# Patient Record
Sex: Female | Born: 1940 | Race: Black or African American | Hispanic: No | Marital: Single | Smoking: Never smoker
Health system: Southern US, Community
[De-identification: ages and names within clinical notes are randomized; demographics above are authoritative.]

## PROBLEM LIST (undated history)

## (undated) DIAGNOSIS — H269 Unspecified cataract: Secondary | ICD-10-CM

## (undated) DIAGNOSIS — E785 Hyperlipidemia, unspecified: Secondary | ICD-10-CM

## (undated) DIAGNOSIS — R87619 Unspecified abnormal cytological findings in specimens from cervix uteri: Secondary | ICD-10-CM

## (undated) DIAGNOSIS — I10 Essential (primary) hypertension: Secondary | ICD-10-CM

## (undated) DIAGNOSIS — R011 Cardiac murmur, unspecified: Secondary | ICD-10-CM

## (undated) DIAGNOSIS — K635 Polyp of colon: Secondary | ICD-10-CM

## (undated) DIAGNOSIS — E119 Type 2 diabetes mellitus without complications: Secondary | ICD-10-CM

## (undated) DIAGNOSIS — IMO0002 Reserved for concepts with insufficient information to code with codable children: Secondary | ICD-10-CM

## (undated) HISTORY — DX: Unspecified cataract: H26.9

## (undated) HISTORY — DX: Essential (primary) hypertension: I10

## (undated) HISTORY — DX: Hyperlipidemia, unspecified: E78.5

## (undated) HISTORY — DX: Cardiac murmur, unspecified: R01.1

## (undated) HISTORY — DX: Polyp of colon: K63.5

## (undated) HISTORY — DX: Reserved for concepts with insufficient information to code with codable children: IMO0002

## (undated) HISTORY — DX: Unspecified abnormal cytological findings in specimens from cervix uteri: R87.619

---

## 2001-03-12 ENCOUNTER — Other Ambulatory Visit: Admission: RE | Admit: 2001-03-12 | Discharge: 2001-03-12 | Payer: Self-pay | Admitting: Obstetrics and Gynecology

## 2004-01-29 ENCOUNTER — Emergency Department (HOSPITAL_COMMUNITY): Admission: EM | Admit: 2004-01-29 | Discharge: 2004-01-29 | Payer: Self-pay | Admitting: Emergency Medicine

## 2009-01-22 ENCOUNTER — Emergency Department (HOSPITAL_COMMUNITY): Admission: EM | Admit: 2009-01-22 | Discharge: 2009-01-22 | Payer: Self-pay | Admitting: Emergency Medicine

## 2009-09-13 ENCOUNTER — Encounter: Admission: RE | Admit: 2009-09-13 | Discharge: 2009-09-13 | Payer: Self-pay | Admitting: Family Medicine

## 2010-12-13 ENCOUNTER — Encounter
Admission: RE | Admit: 2010-12-13 | Discharge: 2010-12-13 | Payer: Self-pay | Source: Home / Self Care | Attending: Family Medicine | Admitting: Family Medicine

## 2011-04-09 ENCOUNTER — Encounter: Payer: Self-pay | Admitting: Gastroenterology

## 2011-05-14 ENCOUNTER — Ambulatory Visit: Payer: Self-pay | Admitting: Gastroenterology

## 2011-11-24 ENCOUNTER — Emergency Department (INDEPENDENT_AMBULATORY_CARE_PROVIDER_SITE_OTHER)
Admission: EM | Admit: 2011-11-24 | Discharge: 2011-11-24 | Disposition: A | Discharge status: Home / Self Care | Payer: Medicare Other | Source: Home / Self Care | Attending: Family Medicine | Admitting: Family Medicine

## 2011-11-24 ENCOUNTER — Encounter (HOSPITAL_COMMUNITY): Payer: Self-pay

## 2011-11-24 DIAGNOSIS — J069 Acute upper respiratory infection, unspecified: Secondary | ICD-10-CM

## 2011-11-24 NOTE — ED Notes (Signed)
Pt had flu shot on Tuesday and has had cough, chills and sorethroat since Wednesday

## 2011-11-24 NOTE — ED Provider Notes (Signed)
History     CSN: [id number]  Arrival date & time 11/24/11  1610   First MD Initiated Contact with Patient 11/24/11 1619      Chief Complaint  Patient presents with  . Cough    (Consider location/radiation/quality/duration/timing/severity/associated sxs/prior treatment) HPI Comments: Carla Wells presents for evaluation of persistent cough, sore throat, aches, and chills over the last week. She reports getting a flu shot earlier in the week. She states her symptoms got a little better but then returned yesterday.  Patient is a 70 y.o. female presenting with URI. The history is provided by the patient.  URI The primary symptoms include fever, sore throat, cough and myalgias. The current episode started 3 to 5 days ago. This is a new problem.  The fever began 3 to 5 days ago. The fever has been unchanged since its onset. The maximum temperature recorded prior to her arrival was unknown.  The cough began 3 to 5 days ago. The cough is non-productive.  Symptoms associated with the illness include chills.    Past Medical History  Diagnosis Date  . Hypertension   . Hyperlipidemia     History reviewed. No pertinent past surgical history.  Family History  Problem Relation Age of Onset  . Stroke Mother   . Kidney failure Father   . Diabetes Brother   . Lung cancer Brother   . Cervical cancer Sister     History  Substance Use Topics  . Smoking status: Never Smoker   . Smokeless tobacco: Not on file  . Alcohol Use: No    OB History    Grav Para Term Preterm Abortions TAB SAB Ect Mult Living                  Review of Systems  Constitutional: Positive for fever and chills.  HENT: Positive for sore throat.   Eyes: Negative.   Respiratory: Positive for cough.   Gastrointestinal: Negative.   Genitourinary: Negative.   Musculoskeletal: Positive for myalgias.  Skin: Negative.   Neurological: Negative.     Allergies  Penicillins  Home Medications   Current Outpatient Rx   Name Route Sig Dispense Refill  . PRESCRIPTION MEDICATION  Unknown b/p pill     . SIMVASTATIN 10 MG PO TABS Oral Take 10 mg by mouth at bedtime.        BP 133/72  Pulse 84  Temp(Src) 98.7 F (37.1 C) (Oral)  Resp 17  SpO2 100%  Physical Exam  Nursing note and vitals reviewed. Constitutional: She is oriented to person, place, and time. She appears well-developed and well-nourished.  HENT:  Head: Normocephalic and atraumatic.  Right Ear: Tympanic membrane is retracted. Tympanic membrane is not erythematous.  Left Ear: Tympanic membrane is retracted. Tympanic membrane is not erythematous.  Mouth/Throat: Uvula is midline, oropharynx is clear and moist and mucous membranes are normal.  Eyes: EOM are normal.  Neck: Normal range of motion.  Pulmonary/Chest: Effort normal and breath sounds normal.  Musculoskeletal: Normal range of motion.  Neurological: She is alert and oriented to person, place, and time.  Skin: Skin is warm and dry.  Psychiatric: Her behavior is normal.    ED Course  Procedures (including critical care time)  Labs Reviewed - No data to display No results found.   1. URI (upper respiratory infection)       MDM          Richardo Priest, MD 12/08/11 1209

## 2013-01-30 ENCOUNTER — Encounter: Payer: Self-pay | Admitting: *Deleted

## 2013-01-30 DIAGNOSIS — I152 Hypertension secondary to endocrine disorders: Secondary | ICD-10-CM | POA: Insufficient documentation

## 2013-01-30 DIAGNOSIS — E785 Hyperlipidemia, unspecified: Secondary | ICD-10-CM | POA: Insufficient documentation

## 2013-02-17 ENCOUNTER — Telehealth: Payer: Self-pay | Admitting: Family Medicine

## 2013-02-17 DIAGNOSIS — E559 Vitamin D deficiency, unspecified: Secondary | ICD-10-CM

## 2013-02-17 MED ORDER — CHOLECALCIFEROL 50 MCG (2000 UT) PO CAPS: 1.0000 | ORAL_CAPSULE | Freq: Every day | ORAL | Status: DC

## 2013-02-17 NOTE — Telephone Encounter (Signed)
Pt no longer needs high dose Vit D.  Order for OTC 2000u/day sent to pharmacy.

## 2013-04-28 ENCOUNTER — Telehealth: Payer: Self-pay | Admitting: Family Medicine

## 2013-04-28 DIAGNOSIS — Z0000 Encounter for general adult medical examination without abnormal findings: Secondary | ICD-10-CM

## 2013-04-28 DIAGNOSIS — E785 Hyperlipidemia, unspecified: Secondary | ICD-10-CM

## 2013-04-28 DIAGNOSIS — Z79899 Other long term (current) drug therapy: Secondary | ICD-10-CM

## 2013-04-29 NOTE — Telephone Encounter (Signed)
Labs ordered for CPE

## 2013-05-06 ENCOUNTER — Encounter: Payer: Self-pay | Admitting: Family Medicine

## 2013-05-10 ENCOUNTER — Ambulatory Visit (INDEPENDENT_AMBULATORY_CARE_PROVIDER_SITE_OTHER): Payer: 59 | Admitting: Family Medicine

## 2013-05-10 ENCOUNTER — Encounter: Payer: Self-pay | Admitting: Family Medicine

## 2013-05-10 VITALS — BP 128/70 | HR 72 | Temp 98.1°F | Resp 16 | Ht 64.0 in | Wt 176.0 lb

## 2013-05-10 DIAGNOSIS — K219 Gastro-esophageal reflux disease without esophagitis: Secondary | ICD-10-CM

## 2013-05-10 DIAGNOSIS — I10 Essential (primary) hypertension: Secondary | ICD-10-CM

## 2013-05-10 LAB — COMPLETE METABOLIC PANEL WITH GFR
GFR, Est African American: 55 mL/min — ABNORMAL LOW
GFR, Est Non African American: 48 mL/min — ABNORMAL LOW
Potassium: 4.4 mEq/L (ref 3.5–5.3)
Total Bilirubin: 0.5 mg/dL (ref 0.3–1.2)

## 2013-05-10 LAB — LIPID PANEL
Cholesterol: 156 mg/dL (ref 0–200)
Triglycerides: 112 mg/dL (ref <150)

## 2013-05-10 MED ORDER — OMEPRAZOLE 40 MG PO CPDR: 40.0000 mg | DELAYED_RELEASE_CAPSULE | Freq: Every day | ORAL | Status: DC

## 2013-05-10 NOTE — Progress Notes (Signed)
  Subjective:    Patient ID: Carla Wells, female    DOB: May 04, 1941, 72 y.o.   MRN: [id number]  HPI Patient is here today for followup of her hypertension and hyperlipidemia. She'll taking Hyzaar 50/12.5 one tablet by mouth daily. She's also taking simvastatin 10 mg by mouth daily. She denies any chest pain, shortness of breath, dyspnea on exertion, myalgias, right upper quadrant pain. She's overdue for a complete physical exam and I recommended that she schedule that this year so we can do her Pap smear and her breast exam. Her colonoscopy and her mammogram are up-to-date. She is complaining of 2-3 episodes of heartburn per week. There is no angina is typical heartburn. Past Medical History  Diagnosis Date  . Murmur   . Colon polyps   . Cataract   . Abnormal pap   . Hypertension   . Hyperlipidemia    Current Outpatient Prescriptions on File Prior to Visit  Medication Sig Dispense Refill  . aspirin 81 MG tablet Take 81 mg by mouth daily.      . Cholecalciferol ([employer] VITAMIN D) 2000 UNITS CAPS Take 1 capsule (2,000 Units total) by mouth daily.  30 each  11  . simvastatin (ZOCOR) 10 MG tablet Take 10 mg by mouth at bedtime.         No current facility-administered medications on file prior to visit.   Allergies  Allergen Reactions  . Penicillins    History   Social History  . Marital Status: Single    Spouse Name: N/A    Number of Children: N/A  . Years of Education: N/A   Occupational History  . Not on file.   Social History Main Topics  . Smoking status: Never Smoker   . Smokeless tobacco: Not on file  . Alcohol Use: No  . Drug Use: No  . Sexually Active: Not on file   Other Topics Concern  . Not on file   Social History Narrative  . No narrative on file      Review of Systems  All other systems reviewed and are negative.       Objective:   Physical Exam  Vitals reviewed. Constitutional: She appears well-developed and well-nourished.  Cardiovascular:  Normal rate and regular rhythm.   Murmur heard. Pulmonary/Chest: Effort normal and breath sounds normal. No respiratory distress. She has no wheezes. She has no rales.  Abdominal: Soft. Bowel sounds are normal. She exhibits no distension. There is no tenderness. There is no rebound and no guarding.          Assessment & Plan:  1. HTN (hypertension) Blood pressure is well controlled. Check fasting lipid panel. Goal LDL is less than 130. Continue present medications at present dosages unless LDL is above L. - COMPLETE METABOLIC PANEL WITH GFR - Lipid panel  2. GERD (gastroesophageal reflux disease) - omeprazole (PRILOSEC) 40 MG capsule; Take 1 capsule (40 mg total) by mouth daily.  Dispense: 90 capsule; Refill: 3 Recommended the patient schedule complete physical exam.

## 2013-08-03 ENCOUNTER — Telehealth: Payer: Self-pay | Admitting: Family Medicine

## 2013-08-03 MED ORDER — SIMVASTATIN 10 MG PO TABS: 10.0000 mg | ORAL_TABLET | Freq: Every day | ORAL | Status: DC

## 2013-08-03 MED ORDER — LOSARTAN POTASSIUM-HCTZ 50-12.5 MG PO TABS: 1.0000 | ORAL_TABLET | Freq: Every day | ORAL | Status: DC

## 2013-08-03 NOTE — Telephone Encounter (Signed)
Simvastatin 10 mg tab 1 QHS #90 Losartan/HCTZ 50-12.5 mg tab 1 QD #90

## 2013-08-03 NOTE — Telephone Encounter (Signed)
Rx Refilled  

## 2013-09-16 ENCOUNTER — Ambulatory Visit (INDEPENDENT_AMBULATORY_CARE_PROVIDER_SITE_OTHER): Payer: 59 | Admitting: *Deleted

## 2013-09-16 DIAGNOSIS — Z23 Encounter for immunization: Secondary | ICD-10-CM

## 2013-10-21 ENCOUNTER — Telehealth: Payer: Self-pay | Admitting: Family Medicine

## 2013-11-04 ENCOUNTER — Ambulatory Visit (INDEPENDENT_AMBULATORY_CARE_PROVIDER_SITE_OTHER): Payer: 59 | Admitting: Family Medicine

## 2013-11-04 ENCOUNTER — Encounter: Payer: Self-pay | Admitting: Family Medicine

## 2013-11-04 VITALS — BP 130/84 | HR 76 | Temp 97.1°F | Resp 16 | Wt 179.0 lb

## 2013-11-04 DIAGNOSIS — Z23 Encounter for immunization: Secondary | ICD-10-CM

## 2013-11-04 DIAGNOSIS — R220 Localized swelling, mass and lump, head: Secondary | ICD-10-CM

## 2013-11-04 DIAGNOSIS — E785 Hyperlipidemia, unspecified: Secondary | ICD-10-CM

## 2013-11-04 LAB — LIPID PANEL
HDL: 46 mg/dL (ref >39)
Total CHOL/HDL Ratio: 3.5 Ratio
VLDL: 17 mg/dL (ref 0–40)

## 2013-11-04 LAB — COMPLETE METABOLIC PANEL WITH GFR
ALT: 13 U/L (ref 0–35)
AST: 14 U/L (ref 0–37)
Albumin: 4 g/dL (ref 3.5–5.2)
BUN: 21 mg/dL (ref 6–23)
Calcium: 9.7 mg/dL (ref 8.4–10.5)
Glucose, Bld: 107 mg/dL — ABNORMAL HIGH (ref 70–99)
Potassium: 4.3 mEq/L (ref 3.5–5.3)
Total Bilirubin: 0.4 mg/dL (ref 0.3–1.2)
Total Protein: 7.5 g/dL (ref 6.0–8.3)

## 2013-11-04 NOTE — Addendum Note (Signed)
Addended by: Legrand Rams B on: 11/04/2013 11:06 AM   Modules accepted: Orders

## 2013-11-04 NOTE — Progress Notes (Signed)
Subjective:    Patient ID: Carla Wells, female    DOB: 1941/01/19, 72 y.o.   MRN: [id number]  HPI  Patient is a very pleasant 72 year old [place] American female who comes in today for follow up of her hypertension and hyperlipidemia.  She's been having no concerns. Her blood pressure is well-controlled today at 130/84. She denies chest pain or shortness of breath. She is also taking simvastatin 10 mg by mouth daily for hyperlipidemia. She denies any myalgias or right upper quadrant pain. She is due for CMP and a fasting lipid panel. Of note she also has a soft mass in the left supraclavicular area adjacent to her neck. He has the physical characteristics of a lipoma that is large ill-defined. It may also represent some lymphedema secondary to lymphadenopathy.  Patient states it has been there for years and has not been enlarging or changing. Past Medical History  Diagnosis Date  . Murmur   . Colon polyps   . Cataract   . Abnormal pap   . Hypertension   . Hyperlipidemia    Current Outpatient Prescriptions on File Prior to Visit  Medication Sig Dispense Refill  . aspirin 81 MG tablet Take 81 mg by mouth daily.      Marland Kitchen losartan-hydrochlorothiazide (HYZAAR) 50-12.5 MG per tablet Take 1 tablet by mouth daily.  90 tablet  1  . omeprazole (PRILOSEC) 40 MG capsule Take 1 capsule (40 mg total) by mouth daily.  90 capsule  3  . simvastatin (ZOCOR) 10 MG tablet Take 1 tablet (10 mg total) by mouth at bedtime.  90 tablet  1   No current facility-administered medications on file prior to visit.   Allergies  Allergen Reactions  . Penicillins    History   Social History  . Marital Status: Single    Spouse Name: N/A    Number of Children: N/A  . Years of Education: N/A   Occupational History  . Not on file.   Social History Main Topics  . Smoking status: Never Smoker   . Smokeless tobacco: Not on file  . Alcohol Use: No  . Drug Use: No  . Sexual Activity: Not on file   Other  Topics Concern  . Not on file   Social History Narrative  . No narrative on file     Review of Systems  All other systems reviewed and are negative.       Objective:   Physical Exam  Vitals reviewed. Cardiovascular: Normal rate and regular rhythm.   Murmur heard. Pulmonary/Chest: Effort normal and breath sounds normal. No respiratory distress. She has no wheezes. She has no rales. She exhibits no tenderness.  Abdominal: Soft. Bowel sounds are normal. She exhibits no distension and no mass. There is no tenderness. There is no rebound and no guarding.  Musculoskeletal: She exhibits no edema.   4 cm x 2 cm ill-defined subcutaneous mass in the left supraclavicular area consistent with a lipoma versus lymphedema.        Assessment & Plan:  1. HLD (hyperlipidemia) Blood pressures well controlled. Continue current medication at the present dosages. I will check a fasting lipid panel. LDL is less than 130. I await results of laboratory testing. - COMPLETE METABOLIC PANEL WITH GFR - Lipid panel  2. Head or neck swelling, mass, or lump I scheduled the patient for an ultrasound of the neck to evaluate this area further. I believe it is most likely a lipoma but I do want to  rule out lymphedema from an underlying lymphadenopathy. - US Soft Tissue Head/Neck; Future

## 2013-11-05 ENCOUNTER — Encounter: Payer: Self-pay | Admitting: Family Medicine

## 2013-11-08 ENCOUNTER — Other Ambulatory Visit: Payer: Medicare Other

## 2013-11-08 ENCOUNTER — Encounter: Payer: Self-pay | Admitting: *Deleted

## 2013-11-12 ENCOUNTER — Other Ambulatory Visit: Payer: Medicare Other

## 2013-11-15 ENCOUNTER — Ambulatory Visit
Admission: RE | Admit: 2013-11-15 | Discharge: 2013-11-15 | Disposition: A | Discharge status: Home / Self Care | Payer: Medicare PPO | Source: Ambulatory Visit | Attending: Family Medicine | Admitting: Family Medicine

## 2013-11-15 DIAGNOSIS — R220 Localized swelling, mass and lump, head: Secondary | ICD-10-CM

## 2014-02-07 ENCOUNTER — Other Ambulatory Visit: Payer: Self-pay | Admitting: Family Medicine

## 2014-03-10 ENCOUNTER — Encounter: Payer: Self-pay | Admitting: Family Medicine

## 2014-04-22 ENCOUNTER — Other Ambulatory Visit: Payer: Self-pay | Admitting: Family Medicine

## 2014-05-12 ENCOUNTER — Ambulatory Visit (INDEPENDENT_AMBULATORY_CARE_PROVIDER_SITE_OTHER): Payer: 59 | Admitting: Family Medicine

## 2014-05-12 ENCOUNTER — Encounter: Payer: Self-pay | Admitting: Family Medicine

## 2014-05-12 VITALS — BP 120/70 | HR 78 | Temp 97.1°F | Resp 18 | Ht 64.0 in | Wt 182.0 lb

## 2014-05-12 DIAGNOSIS — E785 Hyperlipidemia, unspecified: Secondary | ICD-10-CM

## 2014-05-12 DIAGNOSIS — Z1389 Encounter for screening for other disorder: Secondary | ICD-10-CM

## 2014-05-12 DIAGNOSIS — Z1384 Encounter for screening for dental disorders: Secondary | ICD-10-CM

## 2014-05-12 DIAGNOSIS — I10 Essential (primary) hypertension: Secondary | ICD-10-CM

## 2014-05-12 LAB — LIPID PANEL
Cholesterol: 159 mg/dL (ref 0–200)
HDL: 48 mg/dL (ref >39)
LDL Cholesterol: 92 mg/dL (ref 0–99)
TRIGLYCERIDES: 93 mg/dL (ref <150)
Total CHOL/HDL Ratio: 3.3 Ratio
VLDL: 19 mg/dL (ref 0–40)

## 2014-05-12 LAB — COMPLETE METABOLIC PANEL WITH GFR
ALK PHOS: 96 U/L (ref 39–117)
ALT: 15 U/L (ref 0–35)
AST: 16 U/L (ref 0–37)
Albumin: 4.1 g/dL (ref 3.5–5.2)
BUN: 21 mg/dL (ref 6–23)
CO2: 25 mEq/L (ref 19–32)
Calcium: 9.6 mg/dL (ref 8.4–10.5)
Chloride: 103 mEq/L (ref 96–112)
Creat: 1.27 mg/dL — ABNORMAL HIGH (ref 0.50–1.10)
GFR, EST AFRICAN AMERICAN: 49 mL/min — AB
GFR, Est Non African American: 42 mL/min — ABNORMAL LOW
Glucose, Bld: 101 mg/dL — ABNORMAL HIGH (ref 70–99)
Potassium: 4.5 mEq/L (ref 3.5–5.3)
Sodium: 137 mEq/L (ref 135–145)
TOTAL PROTEIN: 7.2 g/dL (ref 6.0–8.3)
Total Bilirubin: 0.6 mg/dL (ref 0.2–1.2)

## 2014-05-12 NOTE — Progress Notes (Signed)
   Subjective:    Patient ID: Carla Wells, female    DOB: 1941/05/26, 73 y.o.   MRN: [id number]  HPI Patient has a history of hypertension.  She is currently taking Hyzaar 50/12.5 one by mouth daily. She does have occasional cramps in her right leg but otherwise is doing well. Her blood pressure is well controlled at 120/70. She denies any chest pain shortness of breath or dyspnea on exertion. She has a history of hyperlipidemia. She is currently taking simvastatin 10 mg by mouth daily. She denies any myalgias or right quadrant pain. Past Medical History  Diagnosis Date  . Murmur   . Colon polyps   . Cataract   . Abnormal pap   . Hypertension   . Hyperlipidemia    Current Outpatient Prescriptions on File Prior to Visit  Medication Sig Dispense Refill  . aspirin 81 MG tablet Take 81 mg by mouth daily.      Marland Kitchen losartan-hydrochlorothiazide (HYZAAR) 50-12.5 MG per tablet TAKE 1 TABLET EVERY DAY  90 tablet  1  . omeprazole (PRILOSEC) 40 MG capsule TAKE 1 CAPSULE EVERY DAY  90 capsule  3  . simvastatin (ZOCOR) 10 MG tablet TAKE 1 TABLET AT BEDTIME  90 tablet  1   No current facility-administered medications on file prior to visit.   No past surgical history on file. Allergies  Allergen Reactions  . Penicillins    History   Social History  . Marital Status: Single    Spouse Name: N/A    Number of Children: N/A  . Years of Education: N/A   Occupational History  . Not on file.   Social History Main Topics  . Smoking status: Never Smoker   . Smokeless tobacco: Not on file  . Alcohol Use: No  . Drug Use: No  . Sexual Activity: Not on file   Other Topics Concern  . Not on file   Social History Narrative  . No narrative on file      Review of Systems  All other systems reviewed and are negative.      Objective:   Physical Exam  Vitals reviewed. Constitutional: She appears well-developed and well-nourished.  Cardiovascular: Normal rate and regular rhythm.     Murmur heard. Pulmonary/Chest: Effort normal and breath sounds normal. No respiratory distress. She has no wheezes. She has no rales.  Abdominal: Soft. Bowel sounds are normal. She exhibits no distension. There is no tenderness. There is no rebound and no guarding.  Musculoskeletal: She exhibits no edema.          Assessment & Plan:  1. Encounter for screening for dental disorder - Ambulatory referral to Dentistry  2. HLD (hyperlipidemia) Check CMP and fasting lipid panel. Goal LDL is less than 130. - COMPLETE METABOLIC PANEL WITH GFR - Lipid panel  3. HTN (hypertension) Blood pressure is currently well controlled. I will make no changes in her medication at this time. I will monitor her renal function as well as potassium.

## 2014-05-17 ENCOUNTER — Encounter: Payer: Self-pay | Admitting: Family Medicine

## 2014-07-26 ENCOUNTER — Other Ambulatory Visit: Payer: Self-pay | Admitting: Family Medicine

## 2014-07-26 NOTE — Telephone Encounter (Signed)
Refill appropriate and filled per protocol. 

## 2014-09-06 ENCOUNTER — Ambulatory Visit (INDEPENDENT_AMBULATORY_CARE_PROVIDER_SITE_OTHER): Payer: 59 | Admitting: Family Medicine

## 2014-09-06 DIAGNOSIS — Z23 Encounter for immunization: Secondary | ICD-10-CM

## 2014-12-14 ENCOUNTER — Telehealth: Payer: Self-pay | Admitting: Family Medicine

## 2014-12-14 NOTE — Telephone Encounter (Signed)
[phone]  Pt is needing to speak to you about a referral to dentist ([facility]) (fax number [phone]) They have her last name as Detwiler though.

## 2014-12-15 NOTE — Telephone Encounter (Signed)
lmtrc

## 2014-12-21 ENCOUNTER — Telehealth: Payer: Self-pay | Admitting: *Deleted

## 2014-12-21 NOTE — Telephone Encounter (Signed)
lmtrc

## 2014-12-21 NOTE — Telephone Encounter (Signed)
Submitted humana referral thru acuity connect for authorization to Dr. 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

## 2014-12-27 ENCOUNTER — Encounter: Payer: Commercial Managed Care - HMO | Admitting: Family Medicine

## 2014-12-28 DIAGNOSIS — H4011X2 Primary open-angle glaucoma, moderate stage: Secondary | ICD-10-CM | POA: Diagnosis not present

## 2015-01-02 ENCOUNTER — Ambulatory Visit (INDEPENDENT_AMBULATORY_CARE_PROVIDER_SITE_OTHER): Payer: Commercial Managed Care - HMO | Admitting: Family Medicine

## 2015-01-02 ENCOUNTER — Encounter: Payer: Self-pay | Admitting: Family Medicine

## 2015-01-02 VITALS — BP 140/80 | HR 76 | Temp 97.7°F | Resp 16 | Ht 64.0 in | Wt 188.0 lb

## 2015-01-02 DIAGNOSIS — Z0000 Encounter for general adult medical examination without abnormal findings: Secondary | ICD-10-CM | POA: Diagnosis not present

## 2015-01-02 DIAGNOSIS — I10 Essential (primary) hypertension: Secondary | ICD-10-CM | POA: Diagnosis not present

## 2015-01-02 DIAGNOSIS — R946 Abnormal results of thyroid function studies: Secondary | ICD-10-CM | POA: Diagnosis not present

## 2015-01-02 DIAGNOSIS — Z01419 Encounter for gynecological examination (general) (routine) without abnormal findings: Secondary | ICD-10-CM | POA: Diagnosis not present

## 2015-01-02 DIAGNOSIS — E785 Hyperlipidemia, unspecified: Secondary | ICD-10-CM | POA: Diagnosis not present

## 2015-01-02 LAB — COMPLETE METABOLIC PANEL WITH GFR
ALT: 14 U/L (ref 0–35)
AST: 15 U/L (ref 0–37)
Albumin: 4 g/dL (ref 3.5–5.2)
Alkaline Phosphatase: 106 U/L (ref 39–117)
BILIRUBIN TOTAL: 0.5 mg/dL (ref 0.2–1.2)
BUN: 20 mg/dL (ref 6–23)
CALCIUM: 9.7 mg/dL (ref 8.4–10.5)
CO2: 27 meq/L (ref 19–32)
Chloride: 103 mEq/L (ref 96–112)
Creat: 1.22 mg/dL — ABNORMAL HIGH (ref 0.50–1.10)
GFR, EST NON AFRICAN AMERICAN: 44 mL/min — AB
GFR, Est African American: 51 mL/min — ABNORMAL LOW
Glucose, Bld: 91 mg/dL (ref 70–99)
Potassium: 4 mEq/L (ref 3.5–5.3)
Sodium: 138 mEq/L (ref 135–145)
Total Protein: 7.1 g/dL (ref 6.0–8.3)

## 2015-01-02 LAB — CBC WITH DIFFERENTIAL/PLATELET
Basophils Absolute: 0.1 10*3/uL (ref 0.0–0.1)
Basophils Relative: 1 % (ref 0–1)
EOS ABS: 0.1 10*3/uL (ref 0.0–0.7)
EOS PCT: 2 % (ref 0–5)
HEMATOCRIT: 41.8 % (ref 36.0–46.0)
Hemoglobin: 13.8 g/dL (ref 12.0–15.0)
LYMPHS PCT: 31 % (ref 12–46)
Lymphs Abs: 2 10*3/uL (ref 0.7–4.0)
MCH: 27.9 pg (ref 26.0–34.0)
MCHC: 33 g/dL (ref 30.0–36.0)
MCV: 84.4 fL (ref 78.0–100.0)
MONOS PCT: 10 % (ref 3–12)
MPV: 11.9 fL (ref 8.6–12.4)
Monocytes Absolute: 0.6 10*3/uL (ref 0.1–1.0)
NEUTROS ABS: 3.6 10*3/uL (ref 1.7–7.7)
Neutrophils Relative %: 56 % (ref 43–77)
Platelets: 275 10*3/uL (ref 150–400)
RBC: 4.95 MIL/uL (ref 3.87–5.11)
RDW: 15.1 % (ref 11.5–15.5)
WBC: 6.4 10*3/uL (ref 4.0–10.5)

## 2015-01-02 LAB — LIPID PANEL
CHOL/HDL RATIO: 3.3 ratio
Cholesterol: 153 mg/dL (ref 0–200)
HDL: 47 mg/dL (ref >39)
LDL Cholesterol: 86 mg/dL (ref 0–99)
TRIGLYCERIDES: 98 mg/dL (ref <150)
VLDL: 20 mg/dL (ref 0–40)

## 2015-01-02 MED ORDER — ZOSTER VACCINE LIVE 19400 UNT/0.65ML ~~LOC~~ SOLR: 0.6500 mL | Freq: Once | SUBCUTANEOUS | Status: DC

## 2015-01-02 NOTE — Progress Notes (Signed)
Subjective:    Patient ID: Carla Wells, female    DOB: Jan 01, 1941, 74 y.o.   MRN: [id number]  HPI Patient is here today for complete physical exam. Her mammogram is due in March. She is due today for a Pap smear. Her colonoscopy was performed in 2010 and is not due again until 2020. She is due for a bone density test. Patient has had Pneumovax 23, Prevnar, and her flu shot. She declines a tetanus shot today. We did discuss the shingles vaccine and I sent the prescription to her pharmacy. She is also due for fasting lab work. Otherwise she has no concerns. She is following up regularly with her eye doctor for glaucoma.  Past Medical History  Diagnosis Date  . Murmur   . Colon polyps   . Cataract   . Abnormal pap   . Hypertension   . Hyperlipidemia    No past surgical history on file. Current Outpatient Prescriptions on File Prior to Visit  Medication Sig Dispense Refill  . aspirin 81 MG tablet Take 81 mg by mouth daily.    Marland Kitchen losartan-hydrochlorothiazide (HYZAAR) 50-12.5 MG per tablet TAKE 1 TABLET EVERY DAY 90 tablet 1  . omeprazole (PRILOSEC) 40 MG capsule TAKE 1 CAPSULE EVERY DAY 90 capsule 3  . simvastatin (ZOCOR) 10 MG tablet TAKE 1 TABLET AT BEDTIME 90 tablet 1   No current facility-administered medications on file prior to visit.   Allergies  Allergen Reactions  . Penicillins    History   Social History  . Marital Status: Single    Spouse Name: N/A    Number of Children: N/A  . Years of Education: N/A   Occupational History  . Not on file.   Social History Main Topics  . Smoking status: Never Smoker   . Smokeless tobacco: Not on file  . Alcohol Use: No  . Drug Use: No  . Sexual Activity: Not on file   Other Topics Concern  . Not on file   Social History Narrative   Family History  Problem Relation Age of Onset  . Stroke Mother   . Kidney failure Father   . Diabetes Brother   . Cancer Brother     Lung - Smoker  . Lung cancer Brother   . Cervical  cancer Sister   . Cancer Sister   . Cancer Sister     ?Cervical CA      Review of Systems  All other systems reviewed and are negative.      Objective:   Physical Exam  Constitutional: She is oriented to person, place, and time. She appears well-developed and well-nourished. No distress.  HENT:  Head: Normocephalic and atraumatic.  Right Ear: External ear normal.  Left Ear: External ear normal.  Nose: Nose normal.  Mouth/Throat: Oropharynx is clear and moist. No oropharyngeal exudate.  Eyes: Conjunctivae and EOM are normal. Pupils are equal, round, and reactive to light. Right eye exhibits no discharge. Left eye exhibits no discharge. No scleral icterus.  Neck: Normal range of motion. Neck supple. No JVD present. No tracheal deviation present. No thyromegaly present.  Cardiovascular: Normal rate, regular rhythm, normal heart sounds and intact distal pulses.  Exam reveals no gallop and no friction rub.   No murmur heard. Pulmonary/Chest: Effort normal and breath sounds normal. No stridor. No respiratory distress. She has no wheezes. She has no rales. She exhibits no tenderness.  Abdominal: Soft. Bowel sounds are normal. She exhibits no distension and no mass.  There is no tenderness. There is no rebound and no guarding.  Genitourinary: Vagina normal and uterus normal.  Musculoskeletal: Normal range of motion. She exhibits no edema or tenderness.  Lymphadenopathy:    She has no cervical adenopathy.  Neurological: She is alert and oriented to person, place, and time. She has normal reflexes. She displays normal reflexes. No cranial nerve deficit. She exhibits normal muscle tone. Coordination normal.  Skin: Skin is warm. No rash noted. She is not diaphoretic. No erythema. No pallor.  Psychiatric: She has a normal mood and affect. Her behavior is normal. Judgment and thought content normal.  Vitals reviewed.         Assessment & Plan:  Routine general medical examination at a  health care facility - Plan: CBC with Differential/Platelet, COMPLETE METABOLIC PANEL WITH GFR, Lipid panel, TSH, PAP, ThinPrep, Imaging, Medicare, MM Digital Screening, DG Bone Density  Patient's physical exam today is completely normal. I sent her Pap smear to pathology. I will check a CBC, CMP, fasting lipid panel, and TSH. I will schedule the patient for mammogram as well as a bone density. I sent the shingles vaccine to her pharmacy. She declined a tetanus shot. 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

## 2015-01-03 LAB — PAP, THIN PREP, IMAGING, MEDICARE

## 2015-01-03 LAB — TSH: TSH: 4.632 u[IU]/mL — AB (ref 0.350–4.500)

## 2015-01-04 ENCOUNTER — Encounter: Payer: Self-pay | Admitting: *Deleted

## 2015-01-04 ENCOUNTER — Encounter: Payer: Self-pay | Admitting: Family Medicine

## 2015-01-16 ENCOUNTER — Other Ambulatory Visit: Payer: Self-pay | Admitting: Family Medicine

## 2015-02-21 DIAGNOSIS — E559 Vitamin D deficiency, unspecified: Secondary | ICD-10-CM | POA: Diagnosis not present

## 2015-02-21 DIAGNOSIS — Z1231 Encounter for screening mammogram for malignant neoplasm of breast: Secondary | ICD-10-CM | POA: Diagnosis not present

## 2015-02-21 LAB — HM DEXA SCAN: HM Dexa Scan: NORMAL

## 2015-02-21 LAB — HM MAMMOGRAPHY

## 2015-03-01 ENCOUNTER — Encounter: Payer: Self-pay | Admitting: Family Medicine

## 2015-03-17 ENCOUNTER — Encounter: Payer: Self-pay | Admitting: *Deleted

## 2015-03-29 DIAGNOSIS — H35033 Hypertensive retinopathy, bilateral: Secondary | ICD-10-CM | POA: Diagnosis not present

## 2015-03-29 DIAGNOSIS — H21262 Iris atrophy (essential) (progressive), left eye: Secondary | ICD-10-CM | POA: Diagnosis not present

## 2015-03-29 DIAGNOSIS — Z961 Presence of intraocular lens: Secondary | ICD-10-CM | POA: Diagnosis not present

## 2015-03-29 DIAGNOSIS — H4011X2 Primary open-angle glaucoma, moderate stage: Secondary | ICD-10-CM | POA: Diagnosis not present

## 2015-04-24 ENCOUNTER — Telehealth: Payer: Self-pay | Admitting: Family Medicine

## 2015-04-24 MED ORDER — OMEPRAZOLE 40 MG PO CPDR: 40.0000 mg | DELAYED_RELEASE_CAPSULE | Freq: Every day | ORAL | Status: DC

## 2015-04-24 NOTE — Telephone Encounter (Signed)
Medication called/sent to requested pharmacy  

## 2015-04-24 NOTE — Telephone Encounter (Signed)
Patient needs refill on omeprazole  [facility] pharmacy

## 2015-05-24 DIAGNOSIS — H4011X2 Primary open-angle glaucoma, moderate stage: Secondary | ICD-10-CM | POA: Diagnosis not present

## 2015-07-24 ENCOUNTER — Other Ambulatory Visit: Payer: Self-pay | Admitting: Family Medicine

## 2015-09-07 ENCOUNTER — Ambulatory Visit (INDEPENDENT_AMBULATORY_CARE_PROVIDER_SITE_OTHER): Payer: Commercial Managed Care - HMO | Admitting: *Deleted

## 2015-09-07 DIAGNOSIS — Z23 Encounter for immunization: Secondary | ICD-10-CM | POA: Diagnosis not present

## 2015-09-07 NOTE — Progress Notes (Signed)
Patient ID: Carla Wells, female   DOB: 30-May-1941, 74 y.o.   MRN: [id number]  Patient seen in office for Influenza Vaccination.   Tolerated IM administration well.   Immunization history updated.

## 2015-10-05 ENCOUNTER — Ambulatory Visit (INDEPENDENT_AMBULATORY_CARE_PROVIDER_SITE_OTHER): Payer: Commercial Managed Care - HMO | Admitting: Physician Assistant

## 2015-10-05 ENCOUNTER — Encounter: Payer: Self-pay | Admitting: Physician Assistant

## 2015-10-05 VITALS — BP 142/86 | HR 76 | Temp 97.5°F | Resp 18 | Wt 184.0 lb

## 2015-10-05 DIAGNOSIS — R110 Nausea: Secondary | ICD-10-CM | POA: Diagnosis not present

## 2015-10-05 DIAGNOSIS — R531 Weakness: Secondary | ICD-10-CM

## 2015-10-05 NOTE — Progress Notes (Signed)
    Patient ID: Carla Wells MRN: [id number], DOB: 1941/01/12, 74 y.o. Date of Encounter: 10/05/2015, 2:41 PM    Chief Complaint:  Chief Complaint  Patient presents with  . sick off/on x 1 week    nauseated, feeling weak     HPI: 74 y.o. year old AA female presents with above symptoms.  She states that she first noticed these symptoms on Saturday 09/30/15. That day felt nauseous. Had no other symptoms just felt not nausea. Says that she felt okay Sunday Monday Tuesday. Yesterday which was Wednesday she started feeling nausea again. Today she again is feeling nausea and a little weak.  During all of this time she has had NO vomiting, diarrhea, fever, anorexia-----says that she has a normal appetite and is eating just like she normally would.  No other complaints or concerns.     Home Meds:   Outpatient Prescriptions Prior to Visit  Medication Sig Dispense Refill  . aspirin 81 MG tablet Take 81 mg by mouth daily.    Marland Kitchen losartan-hydrochlorothiazide (HYZAAR) 50-12.5 MG per tablet TAKE 1 TABLET EVERY DAY 90 tablet 1  . omeprazole (PRILOSEC) 40 MG capsule Take 1 capsule (40 mg total) by mouth daily. 90 capsule 3  . simvastatin (ZOCOR) 10 MG tablet TAKE 1 TABLET AT BEDTIME 90 tablet 1  . Vitamin D, Cholecalciferol, 1000 UNITS CAPS Take 1,000 mg by mouth.     No facility-administered medications prior to visit.    Allergies:  Allergies  Allergen Reactions  . Penicillins       Review of Systems: See HPI for pertinent ROS. All other ROS negative.    Physical Exam: Blood pressure 142/86, pulse 76, temperature 97.5 F (36.4 C), temperature source Oral, resp. rate 18, weight 184 lb (83.462 kg)., Body mass index is 31.57 kg/(m^2). General:  WNWD AAF. Appears in no acute distress. Neck: Supple. No thyromegaly. No lymphadenopathy. Lungs: Clear bilaterally to auscultation without wheezes, rales, or rhonchi. Breathing is unlabored. Heart: Regular rhythm. II/VI murmur. Abdomen:  Soft, non-tender, non-distended with normoactive bowel sounds. No hepatomegaly. No rebound/guarding. No obvious abdominal masses. No area of tenderness with palpation. Msk:  Strength and tone normal for age. Extremities/Skin: Warm and dry. Neuro: Alert and oriented X 3. Moves all extremities spontaneously. Gait is normal. CNII-XII grossly in tact. Psych:  Responds to questions appropriately with a normal affect.     ASSESSMENT AND PLAN:  74 y.o. year old female with  1. Nausea without vomiting Will check lab to further evaluate possible cause of symptoms. Follow-up lab results then determine further treatment plan. In the interim, I told patient to consume a clear liquid diet and advance to bland diet as tolerated. - CBC with Differential/Platelet - COMPLETE METABOLIC PANEL WITH GFR - TSH  2. Weakness Will check lab to further evaluate possible cause of symptoms. Follow-up lab results then determine further treatment plan. In the interim, I told patient to consume a clear liquid diet and advance to bland diet as tolerated.  - CBC with Differential/Platelet - COMPLETE METABOLIC PANEL WITH GFR - TSH   Signed, [address] [place], [place], [facility] 10/05/2015 2:41 PM

## 2015-10-06 LAB — CBC WITH DIFFERENTIAL/PLATELET
BASOS PCT: 1 % (ref 0–1)
Basophils Absolute: 0.1 10*3/uL (ref 0.0–0.1)
EOS ABS: 0.1 10*3/uL (ref 0.0–0.7)
Eosinophils Relative: 2 % (ref 0–5)
HCT: 41.2 % (ref 36.0–46.0)
Hemoglobin: 13.4 g/dL (ref 12.0–15.0)
Lymphocytes Relative: 36 % (ref 12–46)
Lymphs Abs: 2.2 10*3/uL (ref 0.7–4.0)
MCH: 27.8 pg (ref 26.0–34.0)
MCHC: 32.5 g/dL (ref 30.0–36.0)
MCV: 85.5 fL (ref 78.0–100.0)
MONO ABS: 0.6 10*3/uL (ref 0.1–1.0)
MPV: 11.7 fL (ref 8.6–12.4)
Monocytes Relative: 10 % (ref 3–12)
NEUTROS PCT: 51 % (ref 43–77)
Neutro Abs: 3.1 10*3/uL (ref 1.7–7.7)
PLATELETS: 270 10*3/uL (ref 150–400)
RBC: 4.82 MIL/uL (ref 3.87–5.11)
RDW: 14.7 % (ref 11.5–15.5)
WBC: 6 10*3/uL (ref 4.0–10.5)

## 2015-10-06 LAB — COMPLETE METABOLIC PANEL WITH GFR
ALT: 21 U/L (ref 6–29)
AST: 19 U/L (ref 10–35)
Albumin: 3.9 g/dL (ref 3.6–5.1)
Alkaline Phosphatase: 106 U/L (ref 33–130)
BUN: 18 mg/dL (ref 7–25)
CHLORIDE: 100 mmol/L (ref 98–110)
CO2: 28 mmol/L (ref 20–31)
Calcium: 9.5 mg/dL (ref 8.6–10.4)
Creat: 1.12 mg/dL — ABNORMAL HIGH (ref 0.60–0.93)
GFR, EST NON AFRICAN AMERICAN: 49 mL/min — AB (ref >=60)
GFR, Est African American: 56 mL/min — ABNORMAL LOW (ref >=60)
Glucose, Bld: 122 mg/dL — ABNORMAL HIGH (ref 70–99)
Potassium: 3.8 mmol/L (ref 3.5–5.3)
SODIUM: 136 mmol/L (ref 135–146)
Total Bilirubin: 0.4 mg/dL (ref 0.2–1.2)
Total Protein: 7 g/dL (ref 6.1–8.1)

## 2015-10-06 LAB — TSH: TSH: 4.251 u[IU]/mL (ref 0.350–4.500)

## 2015-10-16 ENCOUNTER — Telehealth: Payer: Self-pay | Admitting: *Deleted

## 2015-10-16 NOTE — Telephone Encounter (Signed)
Submitted humana referral thru acuity connect for authorization to Dr. 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

## 2015-10-19 DIAGNOSIS — H401122 Primary open-angle glaucoma, left eye, moderate stage: Secondary | ICD-10-CM | POA: Diagnosis not present

## 2015-10-19 DIAGNOSIS — H401112 Primary open-angle glaucoma, right eye, moderate stage: Secondary | ICD-10-CM | POA: Diagnosis not present

## 2016-01-03 ENCOUNTER — Other Ambulatory Visit: Payer: Commercial Managed Care - HMO

## 2016-01-05 ENCOUNTER — Encounter: Payer: Self-pay | Admitting: Family Medicine

## 2016-01-05 ENCOUNTER — Ambulatory Visit (INDEPENDENT_AMBULATORY_CARE_PROVIDER_SITE_OTHER): Payer: Commercial Managed Care - HMO | Admitting: Family Medicine

## 2016-01-05 VITALS — BP 152/92 | HR 76 | Temp 97.9°F | Resp 18 | Ht 65.0 in | Wt 184.0 lb

## 2016-01-05 DIAGNOSIS — E785 Hyperlipidemia, unspecified: Secondary | ICD-10-CM

## 2016-01-05 DIAGNOSIS — Z0000 Encounter for general adult medical examination without abnormal findings: Secondary | ICD-10-CM

## 2016-01-05 DIAGNOSIS — Z1211 Encounter for screening for malignant neoplasm of colon: Secondary | ICD-10-CM | POA: Diagnosis not present

## 2016-01-05 LAB — COMPLETE METABOLIC PANEL WITH GFR
ALK PHOS: 95 U/L (ref 33–130)
ALT: 16 U/L (ref 6–29)
AST: 20 U/L (ref 10–35)
Albumin: 4.1 g/dL (ref 3.6–5.1)
BUN: 24 mg/dL (ref 7–25)
CO2: 27 mmol/L (ref 20–31)
Calcium: 9.9 mg/dL (ref 8.6–10.4)
Chloride: 104 mmol/L (ref 98–110)
Creat: 1.17 mg/dL — ABNORMAL HIGH (ref 0.60–0.93)
GFR, EST NON AFRICAN AMERICAN: 46 mL/min — AB (ref >=60)
GFR, Est African American: 53 mL/min — ABNORMAL LOW (ref >=60)
GLUCOSE: 108 mg/dL — AB (ref 70–99)
POTASSIUM: 4.2 mmol/L (ref 3.5–5.3)
SODIUM: 140 mmol/L (ref 135–146)
Total Bilirubin: 0.5 mg/dL (ref 0.2–1.2)
Total Protein: 7.1 g/dL (ref 6.1–8.1)

## 2016-01-05 LAB — LIPID PANEL
CHOL/HDL RATIO: 3.6 ratio (ref <=5.0)
Cholesterol: 169 mg/dL (ref 125–200)
HDL: 47 mg/dL (ref >=46)
LDL CALC: 103 mg/dL (ref <130)
Triglycerides: 95 mg/dL (ref <150)
VLDL: 19 mg/dL (ref <30)

## 2016-01-05 NOTE — Progress Notes (Signed)
Subjective:    Patient ID: Carla Wells, female    DOB: [birth year], 75 y.o.   MRN: [id number]  HPI Patient is a very pleasant 75 year old African-American female who is here today for complete physical exam. Her immunization record is listed below. She is up-to-date on all of her immunizations except the shingles vaccine. However her insurance does not cover the shingles vaccine and it was too expensive Immunization History  Administered Date(s) Administered  . Influenza Split 11/06/2012  . Influenza,inj,Quad PF,36+ Mos 09/16/2013, 09/06/2014, 09/07/2015  . Pneumococcal Conjugate-13 11/04/2013  . Pneumococcal Polysaccharide-23 10/03/2010   her last mammogram was March 2016. She is due again next month but she schedule this on her iron. Her last bone density test was March 2016. Her lowest score was present in her right hip where her T score was -1.1. She is on calcium as well as vitamin D. She is not due for repeat bone density test until 2018 at the earliest. Her last Pap smear was last year and was completely normal. Given her age she does not require a Pap smear in the future but the earliest, I will be in 2019 and we will discuss it at that time. Therefore her preventative care is up-to-date. Her last colonoscopy was in 2010. 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 Therefore she is overdue for screening colonoscopy. I am also concerned by her blood pressure which is elevated today 152/92. However she states that she has been eating excessive amounts of salt and she is not taking her blood pressure pills yet this morning Past Medical History  Diagnosis Date  . Murmur   . Colon polyps   . Cataract   . Abnormal pap   . Hypertension   . Hyperlipidemia    No past surgical history on file. Current Outpatient Prescriptions on File Prior to Visit  Medication Sig Dispense Refill  . aspirin 81 MG tablet Take 81 mg by mouth daily.    . Calcium Citrate-Vitamin D  (CALCIUM + D PO) Take 1 tablet by mouth daily.    Marland Kitchen losartan-hydrochlorothiazide (HYZAAR) 50-12.5 MG per tablet TAKE 1 TABLET EVERY DAY 90 tablet 1  . omeprazole (PRILOSEC) 40 MG capsule Take 1 capsule (40 mg total) by mouth daily. 90 capsule 3  . simvastatin (ZOCOR) 10 MG tablet TAKE 1 TABLET AT BEDTIME 90 tablet 1  . Vitamin D, Cholecalciferol, 1000 UNITS CAPS Take 1,000 mg by mouth.     No current facility-administered medications on file prior to visit.   Allergies  Allergen Reactions  . Penicillins    Social History   Social History  . Marital Status: Single    Spouse Name: N/A  . Number of Children: N/A  . Years of Education: N/A   Occupational History  . Not on file.   Social History Main Topics  . Smoking status: Never Smoker   . Smokeless tobacco: Not on file  . Alcohol Use: No  . Drug Use: No  . Sexual Activity: Not on file   Other Topics Concern  . Not on file   Social History Narrative       Review of Systems  All other systems reviewed and are negative.      Objective:   Physical Exam  Constitutional: She is oriented to person, place, and time. She appears well-developed and well-nourished. No distress.  HENT:  Head: Normocephalic and atraumatic.  Right Ear: External ear normal.  Left Ear: External  ear normal.  Nose: Nose normal.  Mouth/Throat: Oropharynx is clear and moist. No oropharyngeal exudate.  Eyes: Conjunctivae and EOM are normal. Pupils are equal, round, and reactive to light. Right eye exhibits no discharge. Left eye exhibits no discharge. No scleral icterus.  Neck: Normal range of motion. Neck supple. No JVD present. No tracheal deviation present. No thyromegaly present.  Cardiovascular: Normal rate, regular rhythm, normal heart sounds and intact distal pulses.  Exam reveals no gallop and no friction rub.   No murmur heard. Pulmonary/Chest: Effort normal and breath sounds normal. No stridor. No respiratory distress. She has no wheezes.  She has no rales. She exhibits no tenderness.  Abdominal: Soft. Bowel sounds are normal. She exhibits no distension and no mass. There is no tenderness. There is no rebound and no guarding.  Musculoskeletal: Normal range of motion. She exhibits no edema or tenderness.  Lymphadenopathy:    She has no cervical adenopathy.  Neurological: She is alert and oriented to person, place, and time. She has normal reflexes. She displays normal reflexes. No cranial nerve deficit. She exhibits normal muscle tone. Coordination normal.  Skin: Skin is warm. No rash noted. She is not diaphoretic. No erythema. No pallor.  Psychiatric: She has a normal mood and affect. Her behavior is normal. Judgment and thought content normal.  Vitals reviewed.         Assessment & Plan:  HLD (hyperlipidemia) - Plan: COMPLETE METABOLIC PANEL WITH GFR, Lipid panel  Routine general medical examination at a health care facility  Colon cancer screening  Patient had a CBC and a CMP obtained in November. The CMP was significant for a mildly elevated blood sugar but it was not fasting. Therefore I will repeat the CMP today. I will also check a fasting lipid panel. Her goal LDL cholesterol is less than 130. Her immunizations are up-to-date. She will schedule her own mammogram. I will schedule her for colon cancer screening/colonoscopy as she is overdue. Her blood pressure significantly elevated today. I have asked her to come by next week and let us recheck her blood pressure. If consistently elevated, I will increase her Hyzaar.

## 2016-01-08 ENCOUNTER — Ambulatory Visit: Payer: Commercial Managed Care - HMO | Admitting: *Deleted

## 2016-01-08 ENCOUNTER — Encounter: Payer: Self-pay | Admitting: *Deleted

## 2016-01-08 VITALS — BP 138/70

## 2016-01-08 DIAGNOSIS — I10 Essential (primary) hypertension: Secondary | ICD-10-CM

## 2016-01-23 ENCOUNTER — Other Ambulatory Visit: Payer: Self-pay | Admitting: Family Medicine

## 2016-01-23 NOTE — Telephone Encounter (Signed)
Medication refilled per protocol. 

## 2016-02-23 DIAGNOSIS — Z1231 Encounter for screening mammogram for malignant neoplasm of breast: Secondary | ICD-10-CM | POA: Diagnosis not present

## 2016-02-23 LAB — HM MAMMOGRAPHY: HM MAMMO: NEGATIVE

## 2016-02-27 ENCOUNTER — Encounter: Payer: Self-pay | Admitting: Family Medicine

## 2016-03-29 DIAGNOSIS — H401112 Primary open-angle glaucoma, right eye, moderate stage: Secondary | ICD-10-CM | POA: Diagnosis not present

## 2016-03-29 DIAGNOSIS — Z961 Presence of intraocular lens: Secondary | ICD-10-CM | POA: Diagnosis not present

## 2016-03-29 DIAGNOSIS — H11153 Pinguecula, bilateral: Secondary | ICD-10-CM | POA: Diagnosis not present

## 2016-03-29 DIAGNOSIS — H401122 Primary open-angle glaucoma, left eye, moderate stage: Secondary | ICD-10-CM | POA: Diagnosis not present

## 2016-03-29 DIAGNOSIS — H524 Presbyopia: Secondary | ICD-10-CM | POA: Diagnosis not present

## 2016-03-29 DIAGNOSIS — I7090 Unspecified atherosclerosis: Secondary | ICD-10-CM | POA: Diagnosis not present

## 2016-04-17 ENCOUNTER — Other Ambulatory Visit: Payer: Self-pay | Admitting: Family Medicine

## 2016-04-17 MED ORDER — SIMVASTATIN 10 MG PO TABS: 10.0000 mg | ORAL_TABLET | Freq: Every day | ORAL | Status: DC

## 2016-04-17 MED ORDER — LOSARTAN POTASSIUM-HCTZ 50-12.5 MG PO TABS: 1.0000 | ORAL_TABLET | Freq: Every day | ORAL | Status: DC

## 2016-04-17 MED ORDER — OMEPRAZOLE 40 MG PO CPDR: 40.0000 mg | DELAYED_RELEASE_CAPSULE | Freq: Every day | ORAL | Status: DC

## 2016-04-17 NOTE — Telephone Encounter (Signed)
Prescription sent to pharmacy.

## 2016-04-17 NOTE — Telephone Encounter (Signed)
Patient is calling to get refills mail order to [facility]  For omeprazole, losartan, and simvastatin  Fax number is [phone]

## 2016-05-31 DIAGNOSIS — H401111 Primary open-angle glaucoma, right eye, mild stage: Secondary | ICD-10-CM | POA: Diagnosis not present

## 2016-05-31 DIAGNOSIS — H401121 Primary open-angle glaucoma, left eye, mild stage: Secondary | ICD-10-CM | POA: Diagnosis not present

## 2016-09-26 ENCOUNTER — Ambulatory Visit (INDEPENDENT_AMBULATORY_CARE_PROVIDER_SITE_OTHER): Payer: Commercial Managed Care - HMO | Admitting: *Deleted

## 2016-09-26 DIAGNOSIS — Z23 Encounter for immunization: Secondary | ICD-10-CM

## 2016-09-26 NOTE — Progress Notes (Signed)
Patient seen in office for Influenza Vaccination.   Tolerated IM administration well.   Immunization history updated.  

## 2016-10-18 DIAGNOSIS — H401121 Primary open-angle glaucoma, left eye, mild stage: Secondary | ICD-10-CM | POA: Diagnosis not present

## 2016-10-18 DIAGNOSIS — H401111 Primary open-angle glaucoma, right eye, mild stage: Secondary | ICD-10-CM | POA: Diagnosis not present

## 2016-10-18 DIAGNOSIS — H40053 Ocular hypertension, bilateral: Secondary | ICD-10-CM | POA: Diagnosis not present

## 2016-10-18 DIAGNOSIS — H401131 Primary open-angle glaucoma, bilateral, mild stage: Secondary | ICD-10-CM | POA: Diagnosis not present

## 2016-11-29 ENCOUNTER — Encounter: Payer: Self-pay | Admitting: Family Medicine

## 2016-11-29 ENCOUNTER — Ambulatory Visit (INDEPENDENT_AMBULATORY_CARE_PROVIDER_SITE_OTHER): Payer: Commercial Managed Care - HMO | Admitting: Family Medicine

## 2016-11-29 VITALS — BP 118/64 | HR 82 | Temp 97.6°F | Resp 16 | Ht 64.0 in | Wt 190.0 lb

## 2016-11-29 DIAGNOSIS — E7800 Pure hypercholesterolemia, unspecified: Secondary | ICD-10-CM | POA: Diagnosis not present

## 2016-11-29 DIAGNOSIS — I10 Essential (primary) hypertension: Secondary | ICD-10-CM | POA: Diagnosis not present

## 2016-11-29 LAB — CBC WITH DIFFERENTIAL/PLATELET
Basophils Absolute: 0 cells/uL (ref 0–200)
Basophils Relative: 0 %
EOS PCT: 1 %
Eosinophils Absolute: 54 cells/uL (ref 15–500)
HCT: 43.8 % (ref 35.0–45.0)
Hemoglobin: 14.6 g/dL (ref 12.0–15.0)
LYMPHS PCT: 33 %
Lymphs Abs: 1782 cells/uL (ref 850–3900)
MCH: 28.7 pg (ref 27.0–33.0)
MCHC: 33.3 g/dL (ref 32.0–36.0)
MCV: 86.1 fL (ref 80.0–100.0)
MPV: 11.1 fL (ref 7.5–12.5)
Monocytes Absolute: 486 cells/uL (ref 200–950)
Monocytes Relative: 9 %
NEUTROS PCT: 57 %
Neutro Abs: 3078 cells/uL (ref 1500–7800)
Platelets: 258 10*3/uL (ref 140–400)
RBC: 5.09 MIL/uL (ref 3.80–5.10)
RDW: 15 % (ref 11.0–15.0)
WBC: 5.4 10*3/uL (ref 3.8–10.8)

## 2016-11-29 LAB — COMPLETE METABOLIC PANEL WITH GFR
ALT: 18 U/L (ref 6–29)
AST: 18 U/L (ref 10–35)
Albumin: 4.1 g/dL (ref 3.6–5.1)
Alkaline Phosphatase: 98 U/L (ref 33–130)
BUN: 25 mg/dL (ref 7–25)
CALCIUM: 9.8 mg/dL (ref 8.6–10.4)
CHLORIDE: 103 mmol/L (ref 98–110)
CO2: 23 mmol/L (ref 20–31)
Creat: 1.19 mg/dL — ABNORMAL HIGH (ref 0.60–0.93)
GFR, Est African American: 52 mL/min — ABNORMAL LOW (ref >=60)
GFR, Est Non African American: 45 mL/min — ABNORMAL LOW (ref >=60)
GLUCOSE: 129 mg/dL — AB (ref 70–99)
POTASSIUM: 4.2 mmol/L (ref 3.5–5.3)
SODIUM: 137 mmol/L (ref 135–146)
Total Bilirubin: 0.4 mg/dL (ref 0.2–1.2)
Total Protein: 7.2 g/dL (ref 6.1–8.1)

## 2016-11-29 LAB — LIPID PANEL
CHOL/HDL RATIO: 3.8 ratio (ref <5.0)
Cholesterol: 171 mg/dL (ref <200)
HDL: 45 mg/dL — AB (ref >50)
LDL CALC: 100 mg/dL — AB (ref <100)
Triglycerides: 130 mg/dL (ref <150)
VLDL: 26 mg/dL (ref <30)

## 2016-11-29 NOTE — Progress Notes (Signed)
Subjective:    Patient ID: Carla Wells, female    DOB: 11/01/1941, 75 y.o.   MRN: [id number]  HPI Patient is very pleasant 75 year old African-American female here today for follow-up of hypertension and hyperlipidemia. Her blood pressure today is well controlled at 118/64. She denies any chest pain shortness of breath or dyspnea on exertion. She denies any myalgias or right upper quadrant pain. Her flu shot is up-to-date. She is due for fasting lab work. Her only concern is that she been getting frequent cramps in her hands and in her legs. This primarily occurs at night when she is at rest. She denies any claudication. She also has been reporting some dry mouth indicating that she is possibly not getting enough fluid. Past Medical History:  Diagnosis Date  . Abnormal pap   . Cataract   . Colon polyps   . Hyperlipidemia   . Hypertension   . Murmur    No past surgical history on file. Current Outpatient Prescriptions on File Prior to Visit  Medication Sig Dispense Refill  . aspirin 81 MG tablet Take 81 mg by mouth daily.    . Calcium Citrate-Vitamin D (CALCIUM + D PO) Take 1 tablet by mouth daily.    Marland Kitchen losartan-hydrochlorothiazide (HYZAAR) 50-12.5 MG tablet Take 1 tablet by mouth daily. 90 tablet 3  . omeprazole (PRILOSEC) 40 MG capsule Take 1 capsule (40 mg total) by mouth daily. 90 capsule 3  . simvastatin (ZOCOR) 10 MG tablet Take 1 tablet (10 mg total) by mouth at bedtime. 90 tablet 3  . Vitamin D, Cholecalciferol, 1000 UNITS CAPS Take 1,000 mg by mouth.     No current facility-administered medications on file prior to visit.    Allergies  Allergen Reactions  . Penicillins    Social History   Social History  . Marital status: Single    Spouse name: N/A  . Number of children: N/A  . Years of education: N/A   Occupational History  . Not on file.   Social History Main Topics  . Smoking status: Never Smoker  . Smokeless tobacco: Not on file  . Alcohol use No  .  Drug use: No  . Sexual activity: Not on file   Other Topics Concern  . Not on file   Social History Narrative  . No narrative on file       Review of Systems  All other systems reviewed and are negative.      Objective:   Physical Exam  Constitutional: She is oriented to person, place, and time. She appears well-developed and well-nourished. No distress.  HENT:  Head: Normocephalic and atraumatic.  Right Ear: External ear normal.  Left Ear: External ear normal.  Nose: Nose normal.  Mouth/Throat: Oropharynx is clear and moist. No oropharyngeal exudate.  Eyes: Conjunctivae and EOM are normal. Pupils are equal, round, and reactive to light. Right eye exhibits no discharge. Left eye exhibits no discharge. No scleral icterus.  Neck: Normal range of motion. Neck supple. No JVD present. No tracheal deviation present. No thyromegaly present.  Cardiovascular: Normal rate, regular rhythm, normal heart sounds and intact distal pulses.  Exam reveals no gallop and no friction rub.   No murmur heard. Pulmonary/Chest: Effort normal and breath sounds normal. No stridor. No respiratory distress. She has no wheezes. She has no rales. She exhibits no tenderness.  Abdominal: Soft. Bowel sounds are normal. She exhibits no distension and no mass. There is no tenderness. There is no rebound and  no guarding.  Musculoskeletal: Normal range of motion. She exhibits no edema or tenderness.  Lymphadenopathy:    She has no cervical adenopathy.  Neurological: She is alert and oriented to person, place, and time. She has normal reflexes. No cranial nerve deficit. She exhibits normal muscle tone. Coordination normal.  Skin: Skin is warm. No rash noted. She is not diaphoretic. No erythema. No pallor.  Psychiatric: She has a normal mood and affect. Her behavior is normal. Judgment and thought content normal.  Vitals reviewed.         Assessment & Plan:  Benign essential HTN - Plan: CBC with  Differential/Platelet, COMPLETE METABOLIC PANEL WITH GFR, Lipid panel  Pure hypercholesterolemia  Her blood pressure today is outstanding. I'm very happy with that. I will check her electrolytes to see if there is no explanation for her cramps. I have recommended that the patient try to drink more water, specifically tonic water with quinine, or even try over-the-counter magnesium oxide to help with cramps. Certainly if her potassium low we can make some alterations in her medication. Also check a fasting lipid panel. Her goal LDL cholesterol is less than 130. Immunizations and cancer screening is up-to-date

## 2016-12-03 ENCOUNTER — Encounter: Payer: Self-pay | Admitting: Family Medicine

## 2017-01-27 ENCOUNTER — Telehealth: Payer: Self-pay | Admitting: Family Medicine

## 2017-01-27 MED ORDER — LOSARTAN POTASSIUM-HCTZ 50-12.5 MG PO TABS: 1.0000 | ORAL_TABLET | Freq: Every day | ORAL | 3 refills | Status: DC

## 2017-01-27 MED ORDER — OMEPRAZOLE 40 MG PO CPDR: 40.0000 mg | DELAYED_RELEASE_CAPSULE | Freq: Every day | ORAL | 3 refills | Status: DC

## 2017-01-27 MED ORDER — SIMVASTATIN 10 MG PO TABS: 10.0000 mg | ORAL_TABLET | Freq: Every day | ORAL | 3 refills | Status: DC

## 2017-01-27 NOTE — Telephone Encounter (Signed)
Medication refilled per protocol. 

## 2017-01-27 NOTE — Telephone Encounter (Signed)
Wants all her medications refilled to [facility] order.

## 2017-01-28 ENCOUNTER — Telehealth: Payer: Self-pay | Admitting: Family Medicine

## 2017-01-28 MED ORDER — SIMVASTATIN 10 MG PO TABS: 10.0000 mg | ORAL_TABLET | Freq: Every day | ORAL | 3 refills | Status: DC

## 2017-01-28 MED ORDER — OMEPRAZOLE 40 MG PO CPDR: 40.0000 mg | DELAYED_RELEASE_CAPSULE | Freq: Every day | ORAL | 3 refills | Status: DC

## 2017-01-28 MED ORDER — LOSARTAN POTASSIUM-HCTZ 50-12.5 MG PO TABS: 1.0000 | ORAL_TABLET | Freq: Every day | ORAL | 3 refills | Status: DC

## 2017-01-28 NOTE — Telephone Encounter (Signed)
Patient is calling to say that we refilled her lisinopril simvastatin and omeprazole through mail order, however needs enough to last her because she is going to run out  [facility]

## 2017-01-28 NOTE — Telephone Encounter (Signed)
Medication called/sent to requested pharmacy  

## 2017-02-25 DIAGNOSIS — M8589 Other specified disorders of bone density and structure, multiple sites: Secondary | ICD-10-CM | POA: Diagnosis not present

## 2017-02-25 DIAGNOSIS — Z1231 Encounter for screening mammogram for malignant neoplasm of breast: Secondary | ICD-10-CM | POA: Diagnosis not present

## 2017-02-25 LAB — HM MAMMOGRAPHY: HM MAMMO: ABNORMAL — AB (ref 0–4)

## 2017-02-25 LAB — HM DEXA SCAN

## 2017-03-03 ENCOUNTER — Encounter: Payer: Self-pay | Admitting: Family Medicine

## 2017-03-03 DIAGNOSIS — M85851 Other specified disorders of bone density and structure, right thigh: Secondary | ICD-10-CM | POA: Insufficient documentation

## 2017-03-03 DIAGNOSIS — M85852 Other specified disorders of bone density and structure, left thigh: Secondary | ICD-10-CM

## 2017-03-04 ENCOUNTER — Encounter: Payer: Self-pay | Admitting: Family Medicine

## 2017-03-04 DIAGNOSIS — N6489 Other specified disorders of breast: Secondary | ICD-10-CM | POA: Diagnosis not present

## 2017-03-14 ENCOUNTER — Encounter: Payer: Self-pay | Admitting: Family Medicine

## 2017-03-28 ENCOUNTER — Ambulatory Visit (INDEPENDENT_AMBULATORY_CARE_PROVIDER_SITE_OTHER): Payer: Medicare HMO | Admitting: Family Medicine

## 2017-03-28 ENCOUNTER — Encounter: Payer: Self-pay | Admitting: Family Medicine

## 2017-03-28 VITALS — BP 150/78 | HR 96 | Temp 97.9°F | Resp 18 | Ht 64.0 in | Wt 198.0 lb

## 2017-03-28 DIAGNOSIS — J40 Bronchitis, not specified as acute or chronic: Secondary | ICD-10-CM | POA: Diagnosis not present

## 2017-03-28 MED ORDER — BENZONATATE 100 MG PO CAPS: 200.0000 mg | ORAL_CAPSULE | Freq: Three times a day (TID) | ORAL | 0 refills | Status: DC | PRN

## 2017-03-28 MED ORDER — AZITHROMYCIN 250 MG PO TABS: ORAL_TABLET | ORAL | 0 refills | Status: DC

## 2017-03-28 NOTE — Progress Notes (Signed)
Subjective:    Patient ID: Carla Wells, female    DOB: [birth year], 76 y.o.   MRN: [id number]  Medication Refill   MRN: [id number]  Medication Refill   Sinus Problem    Patient is very pleasant 76 year old African-American femaleAmerican female here today for cough.  Symptoms began on Saturday with a cough productive of clear yellow sputum. Gradually worsened by Tuesday. Developed some mild chest congestion, sinus irritation, rhinorrhea. She is taking over-the-counter cough medicine and today she is approximately 50% better. She denies any fevers chills pleurisy or shortness of breath Past Medical History:  Diagnosis Date  . Abnormal pap   . Cataract   . Colon polyps   . Hyperlipidemia   . Hypertension   . Murmur    No past surgical history on file. Current Outpatient Prescriptions on File Prior to Visit  Medication Sig Dispense Refill  . aspirin 81 MG tablet Take 81 mg by mouth daily.    . Calcium Citrate-Vitamin D (CALCIUM + D PO) Take 1 tablet by mouth daily.    Marland Kitchen losartan-hydrochlorothiazide (HYZAAR) 50-12.5 MG tablet Take 1 tablet by mouth daily. 90 tablet 3  . omeprazole (PRILOSEC) 40 MG capsule Take 1 capsule (40 mg total) by mouth daily. 90 capsule 3  . simvastatin (ZOCOR) 10 MG tablet Take 1 tablet (10 mg total) by mouth at bedtime. 90 tablet 3  . Vitamin D, Cholecalciferol, 1000 UNITS CAPS Take 1,000 mg by mouth.     No current facility-administered medications on file prior to visit.    Allergies  Allergen Reactions  . Penicillins    Social History   Social History  . Marital status: Single    Spouse name: N/A  . Number of children: N/A  . Years of education: N/A   Occupational History  . Not on file.   Social History Main Topics  . Smoking status: Never Smoker  . Smokeless tobacco: Never Used  . Alcohol use No  . Drug use: No  . Sexual activity: Not on file   Other Topics Concern  . Not on file   Social History Narrative  . No narrative on file       Review of Systems  All other systems  reviewed and are negative.      Objective:   Physical Exam  Constitutional: She appears well-developed and well-nourished. No distress.  HENT:  Head: Normocephalic and atraumatic.  Right Ear: External ear normal.  Left Ear: External ear normal.  Nose: Nose normal.  Mouth/Throat: Oropharynx is clear and moist. No oropharyngeal exudate.  Eyes: Conjunctivae and EOM are normal. Pupils are equal, round, and reactive to light. Right eye exhibits no discharge. Left eye exhibits no discharge. No scleral icterus.  Neck: Normal range of motion. Neck supple.  Cardiovascular: Normal rate, regular rhythm, normal heart sounds and intact distal pulses.  Exam reveals no gallop and no friction rub.   No murmur heard. Pulmonary/Chest: Effort normal and breath sounds normal. No respiratory distress. She has no wheezes. She has no rales. She exhibits no tenderness.  Abdominal: Soft. Bowel sounds are normal. She exhibits no distension and no mass. There is no tenderness. There is no rebound and no guarding.  Lymphadenopathy:    She has no cervical adenopathy.  Skin: She is not diaphoretic.  Vitals reviewed.         Assessment & Plan:  Bronchitis - Plan: azithromycin (ZITHROMAX) 250 MG tablet, benzonatate (TESSALON) 100 MG capsule  Her exam today is completely normal. I explained the patient I think  she has a viral upper respiratory infection/bronchitis. I believe it's clearing on its own and requires no further treatment at this time. Continue over-the-counter cough medicine on an as-needed basis. I suspect symptoms should completely resolve by Saturday of this weekend. I did give the patient prescription for a Z-Pak as well as tessalon perles with strict instructions not to fill unless her symptoms drastically worsened including fever shortness of breath purulent sputum etc.

## 2017-04-01 DIAGNOSIS — H40053 Ocular hypertension, bilateral: Secondary | ICD-10-CM | POA: Diagnosis not present

## 2017-04-01 DIAGNOSIS — H401131 Primary open-angle glaucoma, bilateral, mild stage: Secondary | ICD-10-CM | POA: Diagnosis not present

## 2017-04-29 DIAGNOSIS — Z79899 Other long term (current) drug therapy: Secondary | ICD-10-CM | POA: Diagnosis not present

## 2017-04-29 DIAGNOSIS — R002 Palpitations: Secondary | ICD-10-CM | POA: Diagnosis not present

## 2017-04-29 DIAGNOSIS — K219 Gastro-esophageal reflux disease without esophagitis: Secondary | ICD-10-CM | POA: Diagnosis not present

## 2017-04-29 DIAGNOSIS — H9313 Tinnitus, bilateral: Secondary | ICD-10-CM | POA: Diagnosis not present

## 2017-04-29 DIAGNOSIS — I10 Essential (primary) hypertension: Secondary | ICD-10-CM | POA: Diagnosis not present

## 2017-04-29 DIAGNOSIS — Z0000 Encounter for general adult medical examination without abnormal findings: Secondary | ICD-10-CM | POA: Diagnosis not present

## 2017-04-29 DIAGNOSIS — R599 Enlarged lymph nodes, unspecified: Secondary | ICD-10-CM | POA: Diagnosis not present

## 2017-04-29 DIAGNOSIS — E785 Hyperlipidemia, unspecified: Secondary | ICD-10-CM | POA: Diagnosis not present

## 2017-04-29 DIAGNOSIS — R0989 Other specified symptoms and signs involving the circulatory and respiratory systems: Secondary | ICD-10-CM | POA: Diagnosis not present

## 2017-04-29 DIAGNOSIS — K08409 Partial loss of teeth, unspecified cause, unspecified class: Secondary | ICD-10-CM | POA: Diagnosis not present

## 2017-04-29 DIAGNOSIS — E669 Obesity, unspecified: Secondary | ICD-10-CM | POA: Diagnosis not present

## 2017-09-09 ENCOUNTER — Encounter: Payer: Self-pay | Admitting: Family Medicine

## 2017-09-09 DIAGNOSIS — N6042 Mammary duct ectasia of left breast: Secondary | ICD-10-CM | POA: Diagnosis not present

## 2017-09-11 ENCOUNTER — Ambulatory Visit (INDEPENDENT_AMBULATORY_CARE_PROVIDER_SITE_OTHER): Payer: Medicare HMO | Admitting: Family Medicine

## 2017-09-11 VITALS — BP 144/68

## 2017-09-11 DIAGNOSIS — Z23 Encounter for immunization: Secondary | ICD-10-CM

## 2018-01-12 ENCOUNTER — Ambulatory Visit (INDEPENDENT_AMBULATORY_CARE_PROVIDER_SITE_OTHER): Payer: Medicare HMO | Admitting: Family Medicine

## 2018-01-12 ENCOUNTER — Encounter: Payer: Self-pay | Admitting: Family Medicine

## 2018-01-12 VITALS — BP 140/86 | HR 74 | Temp 97.5°F | Resp 16 | Ht 64.0 in | Wt 208.0 lb

## 2018-01-12 DIAGNOSIS — R1030 Lower abdominal pain, unspecified: Secondary | ICD-10-CM | POA: Diagnosis not present

## 2018-01-12 LAB — COMPLETE METABOLIC PANEL WITH GFR
AG Ratio: 1.2 (calc) (ref 1.0–2.5)
ALBUMIN MSPROF: 3.9 g/dL (ref 3.6–5.1)
ALKALINE PHOSPHATASE (APISO): 117 U/L (ref 33–130)
ALT: 16 U/L (ref 6–29)
AST: 18 U/L (ref 10–35)
BUN / CREAT RATIO: 19 (calc) (ref 6–22)
BUN: 19 mg/dL (ref 7–25)
CO2: 28 mmol/L (ref 20–32)
Calcium: 10 mg/dL (ref 8.6–10.4)
Chloride: 101 mmol/L (ref 98–110)
Creat: 1.01 mg/dL — ABNORMAL HIGH (ref 0.60–0.93)
GFR, Est African American: 63 mL/min/{1.73_m2} (ref >=60)
GFR, Est Non African American: 54 mL/min/{1.73_m2} — ABNORMAL LOW (ref >=60)
GLUCOSE: 101 mg/dL — AB (ref 65–99)
Globulin: 3.3 g/dL (calc) (ref 1.9–3.7)
Potassium: 4.3 mmol/L (ref 3.5–5.3)
Sodium: 138 mmol/L (ref 135–146)
TOTAL PROTEIN: 7.2 g/dL (ref 6.1–8.1)
Total Bilirubin: 0.3 mg/dL (ref 0.2–1.2)

## 2018-01-12 LAB — CBC WITH DIFFERENTIAL/PLATELET
BASOS PCT: 0.7 %
Basophils Absolute: 48 cells/uL (ref 0–200)
EOS PCT: 1.7 %
Eosinophils Absolute: 117 cells/uL (ref 15–500)
HCT: 41.3 % (ref 35.0–45.0)
HEMOGLOBIN: 13.8 g/dL (ref 11.7–15.5)
LYMPHS ABS: 2381 {cells}/uL (ref 850–3900)
MCH: 27.4 pg (ref 27.0–33.0)
MCHC: 33.4 g/dL (ref 32.0–36.0)
MCV: 81.9 fL (ref 80.0–100.0)
MONOS PCT: 10.2 %
MPV: 12.7 fL — AB (ref 7.5–12.5)
NEUTROS ABS: 3650 {cells}/uL (ref 1500–7800)
Neutrophils Relative %: 52.9 %
Platelets: 291 10*3/uL (ref 140–400)
RBC: 5.04 10*6/uL (ref 3.80–5.10)
RDW: 13.9 % (ref 11.0–15.0)
Total Lymphocyte: 34.5 %
WBC mixed population: 704 cells/uL (ref 200–950)
WBC: 6.9 10*3/uL (ref 3.8–10.8)

## 2018-01-12 NOTE — Progress Notes (Signed)
Subjective:    Patient ID: Carla Wells, female    DOB: [birth year], 77 y.o.   MRN: [id number]  HPI  Beginning on Friday, the patient developed lower abdominal pain.  Pain is centered around her umbilicus.  It is tender and sore in that area.  The soreness and tenderness is very superficial to the touch.  She denies any constipation.  She denies any diarrhea.  She denies any melena or hematochezia.  She denies any nausea or vomiting.  She denies any dysuria or hematuria.  She denies any falls.  On examination today, the muscles in her lower abdomen are sore to the touch.  I cannot appreciate any palpable mass.  There is no umbilical hernia or ventral hernia.  However the soreness is centered around her umbilicus and in the ventral scar that the patient had remotely for her tubal ligation just below the umbilicus.  There is no distention.  She has normal bowel sounds.  There is no exacerbating or alleviating factor.  The pain seems to be very superficial to the touch and seems to be muscular in nature or in the abdominal wall itself. Past Medical History:  Diagnosis Date  . Abnormal pap   . Cataract   . Colon polyps   . Hyperlipidemia   . Hypertension   . Murmur    No past surgical history on file. Current Outpatient Medications on File Prior to Visit  Medication Sig Dispense Refill  . aspirin 81 MG tablet Take 81 mg by mouth daily.    . Calcium Citrate-Vitamin D (CALCIUM + D PO) Take 1 tablet by mouth daily.    Marland Kitchen losartan-hydrochlorothiazide (HYZAAR) 50-12.5 MG tablet Take 1 tablet by mouth daily. 90 tablet 3  . omeprazole (PRILOSEC) 40 MG capsule Take 1 capsule (40 mg total) by mouth daily. 90 capsule 3  . simvastatin (ZOCOR) 10 MG tablet Take 1 tablet (10 mg total) by mouth at bedtime. 90 tablet 3  . Vitamin D, Cholecalciferol, 1000 UNITS CAPS Take 1,000 mg by mouth.     No current facility-administered medications on file prior to visit.    Allergies  Allergen Reactions  .  Penicillins    Social History   Socioeconomic History  . Marital status: Single    Spouse name: Not on file  . Number of children: Not on file  . Years of education: Not on file  . Highest education level: Not on file  Social Needs  . Financial resource strain: Not on file  . Food insecurity - worry: Not on file  . Food insecurity - inability: Not on file  . Transportation needs - medical: Not on file  . Transportation needs - non-medical: Not on file  Occupational History  . Not on file  Tobacco Use  . Smoking status: Never Smoker  . Smokeless tobacco: Never Used  Substance and Sexual Activity  . Alcohol use: No  . Drug use: No  . Sexual activity: Not on file  Other Topics Concern  . Not on file  Social History Narrative  . Not on file     Review of Systems  All other systems reviewed and are negative.      Objective:   Physical Exam  Constitutional: She appears well-developed and well-nourished.  Cardiovascular: Normal rate and normal heart sounds. Exam reveals no gallop.  No murmur heard. Pulmonary/Chest: Effort normal and breath sounds normal. No respiratory distress. She has no wheezes. She has no rales. She exhibits no tenderness.  Abdominal: Soft. Bowel sounds are normal. She exhibits no distension and no mass. There is tenderness. There is no rebound and no guarding.    Vitals reviewed.         Assessment & Plan:  Lower abdominal pain - Plan: CBC with Differential/Platelet, COMPLETE METABOLIC PANEL WITH GFR  Patient's pain appears to be abdominal wall pain either from a muscle strain or possibly a small hernia or possibly even due to adhesions.  She denies any symptoms of bowel obstruction, she denies any symptoms of diverticulitis or bladder infection.  She is having normal bowel movements.  She is afebrile.  The pain is slowly improving and today is very mild.  I have recommended that we monitor it for the next week.  If it continues to improve on its  own it could simply be a muscle strain.  If the pain worsens, she is to recheck with [place] immediately as we may need to proceed with a.  However at the present time I recommended clinical monitoring of the pain seems to slowly be subsiding over the last 4 days on its own

## 2018-02-27 DIAGNOSIS — Z1231 Encounter for screening mammogram for malignant neoplasm of breast: Secondary | ICD-10-CM | POA: Diagnosis not present

## 2018-03-02 ENCOUNTER — Other Ambulatory Visit: Payer: Self-pay | Admitting: Family Medicine

## 2018-03-12 ENCOUNTER — Encounter: Payer: Self-pay | Admitting: Family Medicine

## 2018-03-12 ENCOUNTER — Ambulatory Visit (INDEPENDENT_AMBULATORY_CARE_PROVIDER_SITE_OTHER): Payer: Medicare HMO | Admitting: Family Medicine

## 2018-03-12 ENCOUNTER — Other Ambulatory Visit: Payer: Self-pay

## 2018-03-12 VITALS — BP 140/82 | HR 64 | Temp 98.1°F | Resp 18 | Ht 64.0 in | Wt 202.0 lb

## 2018-03-12 DIAGNOSIS — M25512 Pain in left shoulder: Secondary | ICD-10-CM | POA: Diagnosis not present

## 2018-03-12 NOTE — Progress Notes (Signed)
Subjective:    Patient ID: Carla Wells, female    DOB: [birth year], 77 y.o.   MRN: [id number]  HPI  Patient was pulling weeds last night in her yard.  She suddenly developed pain in her left shoulder.  This morning she is unable to lift her arm.  She is barely able to put on her shirt.  She has severe pain in her left shoulder with abduction.  Passively I am able to lift her left arm to approximately 60 degrees before she has severe pain.  When I then drop her arm, it causes severe pain in the left shoulder under the deltoid.  Symptoms are concerning for rotator cuff tear versus rotator cuff tendinitis.  She has pain with Hawkins maneuver.  She is unable to perform empty can testing due to pain.  She has normal strength in her biceps and triceps.  She has normal grip strength and normal sensation in the left arm. Past Medical History:  Diagnosis Date  . Abnormal pap   . Cataract   . Colon polyps   . Hyperlipidemia   . Hypertension   . Murmur    History reviewed. No pertinent surgical history. Current Outpatient Medications on File Prior to Visit  Medication Sig Dispense Refill  . aspirin 81 MG tablet Take 81 mg by mouth daily.    . Calcium Citrate-Vitamin D (CALCIUM + D PO) Take 1 tablet by mouth daily.    Marland Kitchen losartan-hydrochlorothiazide (HYZAAR) 50-12.5 MG tablet TAKE 1 TABLET BY MOUTH DAILY. 90 tablet 3  . omeprazole (PRILOSEC) 40 MG capsule TAKE 1 CAPSULE (40 MG TOTAL) BY MOUTH DAILY. 90 capsule 3  . simvastatin (ZOCOR) 10 MG tablet TAKE 1 TABLET (10 MG TOTAL) BY MOUTH AT BEDTIME. 90 tablet 3  . Vitamin D, Cholecalciferol, 1000 UNITS CAPS Take 1,000 mg by mouth.     No current facility-administered medications on file prior to visit.    Allergies  Allergen Reactions  . Penicillins    Social History   Socioeconomic History  . Marital status: Single    Spouse name: Not on file  . Number of children: Not on file  . Years of education: Not on file  . Highest education level:  Not on file  Occupational History  . Not on file  Social Needs  . Financial resource strain: Not on file  . Food insecurity:    Worry: Not on file    Inability: Not on file  . Transportation needs:    Medical: Not on file    Non-medical: Not on file  Tobacco Use  . Smoking status: Never Smoker  . Smokeless tobacco: Never Used  Substance and Sexual Activity  . Alcohol use: No  . Drug use: No  . Sexual activity: Not on file  Lifestyle  . Physical activity:    Days per week: Not on file    Minutes per session: Not on file  . Stress: Not on file  Relationships  . Social connections:    Talks on phone: Not on file    Gets together: Not on file    Attends religious service: Not on file    Active member of club or organization: Not on file    Attends meetings of clubs or organizations: Not on file    Relationship status: Not on file  . Intimate partner violence:    Fear of current or ex partner: Not on file    Emotionally abused: Not on file  Physically abused: Not on file    Forced sexual activity: Not on file  Other Topics Concern  . Not on file  Social History Narrative  . Not on file     Review of Systems  All other systems reviewed and are negative.      Objective:   Physical Exam  Constitutional: She appears well-developed and well-nourished.  HENT:  Right Ear: External ear normal.  Left Ear: External ear normal.  Nose: Nose normal.  Mouth/Throat: Oropharynx is clear and moist.  Cardiovascular: Normal rate, regular rhythm and normal heart sounds.  Pulmonary/Chest: Effort normal and breath sounds normal. No respiratory distress. She has no wheezes. She has no rales.  Musculoskeletal:       Left shoulder: She exhibits decreased range of motion, tenderness, bony tenderness, pain and decreased strength. She exhibits no swelling, no effusion and no crepitus.  Vitals reviewed.         Assessment & Plan:  Acute pain of left shoulder  Exam is concerning  for rotator cuff tear versus rotator cuff tendinitis versus subacromial bursitis.  Using sterile technique, I injected the left subacromial space with 2 cc lidocaine, 2 cc of Marcaine, and 2 cc of 40 mg/mL Kenalog.  Patient tolerated the procedure well without complication.  Recheck next week if no better.  She originally made the appointment for cough sneezing and rhinorrhea.  This is gradually improved over the last week.  I have recommended using a combination of Zyrtec and Flonase to treat seasonal allergies which is the most likely cause especially given the fact her symptoms are improving.  She denies any chest pain.  She denies any shortness of breath.  She denies any fever or purulent sputum or sinus pain

## 2018-04-03 DIAGNOSIS — H35363 Drusen (degenerative) of macula, bilateral: Secondary | ICD-10-CM | POA: Diagnosis not present

## 2018-04-03 DIAGNOSIS — H35033 Hypertensive retinopathy, bilateral: Secondary | ICD-10-CM | POA: Diagnosis not present

## 2018-04-03 DIAGNOSIS — H401131 Primary open-angle glaucoma, bilateral, mild stage: Secondary | ICD-10-CM | POA: Diagnosis not present

## 2018-04-03 DIAGNOSIS — Z961 Presence of intraocular lens: Secondary | ICD-10-CM | POA: Diagnosis not present

## 2018-04-22 ENCOUNTER — Other Ambulatory Visit: Payer: Self-pay | Admitting: *Deleted

## 2018-04-22 MED ORDER — SIMVASTATIN 10 MG PO TABS: 10.0000 mg | ORAL_TABLET | Freq: Every day | ORAL | 3 refills | Status: DC

## 2018-04-22 MED ORDER — OMEPRAZOLE 40 MG PO CPDR: 40.0000 mg | DELAYED_RELEASE_CAPSULE | Freq: Every day | ORAL | 3 refills | Status: DC

## 2018-04-22 MED ORDER — LOSARTAN POTASSIUM-HCTZ 50-12.5 MG PO TABS: 1.0000 | ORAL_TABLET | Freq: Every day | ORAL | 3 refills | Status: DC

## 2018-04-24 ENCOUNTER — Ambulatory Visit (INDEPENDENT_AMBULATORY_CARE_PROVIDER_SITE_OTHER): Payer: Medicare HMO | Admitting: Family Medicine

## 2018-04-24 ENCOUNTER — Other Ambulatory Visit: Payer: Self-pay

## 2018-04-24 ENCOUNTER — Telehealth: Payer: Self-pay | Admitting: Family Medicine

## 2018-04-24 ENCOUNTER — Encounter: Payer: Self-pay | Admitting: Family Medicine

## 2018-04-24 VITALS — BP 150/70 | HR 83 | Temp 97.6°F | Resp 18 | Ht 64.0 in | Wt 200.0 lb

## 2018-04-24 DIAGNOSIS — G8929 Other chronic pain: Secondary | ICD-10-CM | POA: Diagnosis not present

## 2018-04-24 DIAGNOSIS — M25512 Pain in left shoulder: Secondary | ICD-10-CM

## 2018-04-24 DIAGNOSIS — M75102 Unspecified rotator cuff tear or rupture of left shoulder, not specified as traumatic: Secondary | ICD-10-CM

## 2018-04-24 MED ORDER — SIMVASTATIN 10 MG PO TABS: 10.0000 mg | ORAL_TABLET | Freq: Every day | ORAL | 3 refills | Status: DC

## 2018-04-24 MED ORDER — HYDROCODONE-ACETAMINOPHEN 5-325 MG PO TABS: 1.0000 | ORAL_TABLET | Freq: Four times a day (QID) | ORAL | 0 refills | Status: DC | PRN

## 2018-04-24 MED ORDER — MELOXICAM 15 MG PO TABS: 15.0000 mg | ORAL_TABLET | Freq: Every day | ORAL | 0 refills | Status: DC

## 2018-04-24 NOTE — Telephone Encounter (Signed)
Patient is calling to get a refill on her simvastatin sent to [facility]

## 2018-04-24 NOTE — Telephone Encounter (Signed)
rx sent to pharmacy

## 2018-04-24 NOTE — Progress Notes (Signed)
Subjective:    Patient ID: Carla Wells, female    DOB: 08-14-1941, 78 y.o.   MRN: [id number]  HPI 03/12/18 Patient was pulling weeds last night in her yard.  She suddenly developed pain in her left shoulder.  This morning she is unable to lift her arm.  She is barely able to put on her shirt.  She has severe pain in her left shoulder with abduction.  Passively I am able to lift her left arm to approximately 60 degrees before she has severe pain.  When I then drop her arm, it causes severe pain in the left shoulder under the deltoid.  Symptoms are concerning for rotator cuff tear versus rotator cuff tendinitis.  She has pain with Hawkins maneuver.  She is unable to perform empty can testing due to pain.  She has normal strength in her biceps and triceps.  She has normal grip strength and normal sensation in the left arm.  At that time, my plan was:  Exam is concerning for rotator cuff tear versus rotator cuff tendinitis versus subacromial bursitis.  Using sterile technique, I injected the left subacromial space with 2 cc lidocaine, 2 cc of Marcaine, and 2 cc of 40 mg/mL Kenalog.  Patient tolerated the procedure well without complication.  Recheck next week if no better.  She originally made the appointment for cough sneezing and rhinorrhea.  This is gradually improved over the last week.  I have recommended using a combination of Zyrtec and Flonase to treat seasonal allergies which is the most likely cause especially given the fact her symptoms are improving.  She denies any chest pain.  She denies any shortness of breath.  She denies any fever or purulent sputum or sinus pain  04/24/18 Patient states that the shot in her shoulder helped after about 48 hours.  The pain subsided and almost completely went away for 4 weeks however over the last 2 weeks, the pain has returned and is now more severe than ever.  She is unable to abduct her arm greater than 80 degrees due to severe pain.  Passively I can  abduct it to 100 degrees however she has severe pain beyond that.  Drop test elicits tremendous pain in her shoulder along the path of the supraspinatus muscle.  She has a positive empty can sign.  She has a positive Hawkins sign.  Strength is significantly diminished with resisted abduction greater than 60 degrees. Past Medical History:  Diagnosis Date  . Abnormal pap   . Cataract   . Colon polyps   . Hyperlipidemia   . Hypertension   . Murmur    No past surgical history on file. Current Outpatient Medications on File Prior to Visit  Medication Sig Dispense Refill  . aspirin 81 MG tablet Take 81 mg by mouth daily.    . Calcium Citrate-Vitamin D (CALCIUM + D PO) Take 1 tablet by mouth daily.    Marland Kitchen losartan-hydrochlorothiazide (HYZAAR) 50-12.5 MG tablet Take 1 tablet by mouth daily. 90 tablet 3  . omeprazole (PRILOSEC) 40 MG capsule Take 1 capsule (40 mg total) by mouth daily. 90 capsule 3  . simvastatin (ZOCOR) 10 MG tablet Take 1 tablet (10 mg total) by mouth at bedtime. 90 tablet 3  . Vitamin D, Cholecalciferol, 1000 UNITS CAPS Take 1,000 mg by mouth.     No current facility-administered medications on file prior to visit.    Allergies  Allergen Reactions  . Penicillins    Social History  Socioeconomic History  . Marital status: Single    Spouse name: Not on file  . Number of children: Not on file  . Years of education: Not on file  . Highest education level: Not on file  Occupational History  . Not on file  Social Needs  . Financial resource strain: Not on file  . Food insecurity:    Worry: Not on file    Inability: Not on file  . Transportation needs:    Medical: Not on file    Non-medical: Not on file  Tobacco Use  . Smoking status: Never Smoker  . Smokeless tobacco: Never Used  Substance and Sexual Activity  . Alcohol use: No  . Drug use: No  . Sexual activity: Not on file  Lifestyle  . Physical activity:    Days per week: Not on file    Minutes per  session: Not on file  . Stress: Not on file  Relationships  . Social connections:    Talks on phone: Not on file    Gets together: Not on file    Attends religious service: Not on file    Active member of club or organization: Not on file    Attends meetings of clubs or organizations: Not on file    Relationship status: Not on file  . Intimate partner violence:    Fear of current or ex partner: Not on file    Emotionally abused: Not on file    Physically abused: Not on file    Forced sexual activity: Not on file  Other Topics Concern  . Not on file  Social History Narrative  . Not on file     Review of Systems  All other systems reviewed and are negative.      Objective:   Physical Exam  Constitutional: She appears well-developed and well-nourished.  HENT:  Right Ear: External ear normal.  Left Ear: External ear normal.  Nose: Nose normal.  Mouth/Throat: Oropharynx is clear and moist.  Cardiovascular: Normal rate, regular rhythm and normal heart sounds.  Pulmonary/Chest: Effort normal and breath sounds normal. No respiratory distress. She has no wheezes. She has no rales.  Musculoskeletal:       Left shoulder: She exhibits decreased range of motion, tenderness, bony tenderness, pain and decreased strength. She exhibits no swelling, no effusion and no crepitus.  Vitals reviewed.         Assessment & Plan:  Tear of left rotator cuff, unspecified tear extent, unspecified whether traumatic - Plan: HYDROcodone-acetaminophen (NORCO) 5-325 MG tablet, meloxicam (MOBIC) 15 MG tablet, MR Shoulder Left Wo Contrast  Chronic left shoulder pain - Plan: MR Shoulder Left Wo Contrast I suspect the patient has torn or partially torn her supraspinatus.  I recommended an MRI given the fact she is failed 6 weeks of conservative therapy and cortisone injection.  Likely will recommend an orthopedic surgery consultation if tear is confirmed versus physical therapy for partial tear.   Meanwhile start meloxicam 15 mg p.o. daily for pain and use hydrocodone 5/325 1 p.o. every 6 hours as needed breakthrough pain.  Her blood pressure is elevated today but she has been off her medication for 2 days.  I recommended she resume her blood pressure medication.

## 2018-05-10 ENCOUNTER — Ambulatory Visit
Admission: RE | Admit: 2018-05-10 | Discharge: 2018-05-10 | Disposition: A | Discharge status: Home / Self Care | Payer: Medicare HMO | Source: Ambulatory Visit | Attending: Family Medicine | Admitting: Family Medicine

## 2018-05-10 DIAGNOSIS — M25512 Pain in left shoulder: Secondary | ICD-10-CM

## 2018-05-10 DIAGNOSIS — G8929 Other chronic pain: Secondary | ICD-10-CM

## 2018-05-10 DIAGNOSIS — M19012 Primary osteoarthritis, left shoulder: Secondary | ICD-10-CM | POA: Diagnosis not present

## 2018-05-10 DIAGNOSIS — M75102 Unspecified rotator cuff tear or rupture of left shoulder, not specified as traumatic: Secondary | ICD-10-CM

## 2018-05-11 ENCOUNTER — Other Ambulatory Visit: Payer: Self-pay | Admitting: Family Medicine

## 2018-05-11 DIAGNOSIS — M25512 Pain in left shoulder: Principal | ICD-10-CM

## 2018-05-11 DIAGNOSIS — G8929 Other chronic pain: Secondary | ICD-10-CM

## 2018-05-11 DIAGNOSIS — M75100 Unspecified rotator cuff tear or rupture of unspecified shoulder, not specified as traumatic: Secondary | ICD-10-CM

## 2018-05-11 DIAGNOSIS — M75102 Unspecified rotator cuff tear or rupture of left shoulder, not specified as traumatic: Secondary | ICD-10-CM

## 2018-05-12 DIAGNOSIS — M25512 Pain in left shoulder: Secondary | ICD-10-CM | POA: Diagnosis not present

## 2018-05-13 ENCOUNTER — Other Ambulatory Visit: Payer: Self-pay | Admitting: Family Medicine

## 2018-05-13 MED ORDER — LOSARTAN POTASSIUM-HCTZ 50-12.5 MG PO TABS: 1.0000 | ORAL_TABLET | Freq: Every day | ORAL | 3 refills | Status: DC

## 2018-05-13 MED ORDER — OMEPRAZOLE 40 MG PO CPDR: 40.0000 mg | DELAYED_RELEASE_CAPSULE | Freq: Every day | ORAL | 3 refills | Status: DC

## 2018-05-21 ENCOUNTER — Other Ambulatory Visit: Payer: Self-pay | Admitting: Family Medicine

## 2018-05-21 DIAGNOSIS — M75102 Unspecified rotator cuff tear or rupture of left shoulder, not specified as traumatic: Secondary | ICD-10-CM

## 2018-06-10 DIAGNOSIS — M7552 Bursitis of left shoulder: Secondary | ICD-10-CM | POA: Diagnosis not present

## 2018-06-10 DIAGNOSIS — M25512 Pain in left shoulder: Secondary | ICD-10-CM | POA: Diagnosis not present

## 2018-06-10 DIAGNOSIS — M7542 Impingement syndrome of left shoulder: Secondary | ICD-10-CM | POA: Diagnosis not present

## 2018-09-09 ENCOUNTER — Other Ambulatory Visit: Payer: Self-pay

## 2018-09-09 ENCOUNTER — Ambulatory Visit (INDEPENDENT_AMBULATORY_CARE_PROVIDER_SITE_OTHER): Payer: Medicare HMO | Admitting: Family Medicine

## 2018-09-09 ENCOUNTER — Encounter: Payer: Self-pay | Admitting: Family Medicine

## 2018-09-09 VITALS — BP 182/86 | HR 80 | Temp 98.1°F | Resp 16 | Ht 64.0 in | Wt 200.0 lb

## 2018-09-09 DIAGNOSIS — R42 Dizziness and giddiness: Secondary | ICD-10-CM | POA: Diagnosis not present

## 2018-09-09 DIAGNOSIS — Z23 Encounter for immunization: Secondary | ICD-10-CM

## 2018-09-09 DIAGNOSIS — I160 Hypertensive urgency: Secondary | ICD-10-CM | POA: Diagnosis not present

## 2018-09-09 MED ORDER — AMLODIPINE BESYLATE 5 MG PO TABS: 5.0000 mg | ORAL_TABLET | Freq: Every day | ORAL | 0 refills | Status: DC

## 2018-09-09 NOTE — Progress Notes (Signed)
Orthostatic Blood Pressure:  Lying:   180/92  71HR  97%  Sitting:  178/90  76HR  97%  Standing:  184/88 82HR  98%  Standing x2: 178/88  78HR  99%

## 2018-09-09 NOTE — Progress Notes (Signed)
Patient ID: Carla Wells, female    DOB: [birth year], 77 y.o.   MRN: [id number]  PCP: Susy Frizzle, MD  Chief Complaint  Patient presents with  . Vertigo    x1 week- noticed that she has been dizzy and thinks it's her BP    Subjective:   Carla Wells is a 77 y.o. female, with PMHx of HLD and HTN presents to clinic with CC of dizziness x 1-2 weeks, she feels like it is her blood pressure, which is elevated here today.  Blood pressure was repeated multiple times in clinic and systolic has been 099I to 180s.  She did take her medication this morning.  She does not have a home blood pressure monitor nor has she been checking it anywhere else.  She is on losartan-HCTZ 50-12.5, states she takes in the morning, reports being compliant with this, has had this medication for last year per her report.  She states that prior to this medication she was on another one that dropped her blood pressure too low and it was changed to this 1.  Per chart review cannot find any other blood pressure medications aside from Hydrocort thiazide 12.5 by itself.  She reports that she felt similar symptoms when adjusting his medications but reports that it was because of too low blood pressure.  Currently she states that the dizziness is described as "swimmy headed, with staggering, feeling wobbly or having sleepy eyes."  She denies any visual disturbances, headaches, chest pain, shortness of breath, weakness, orthopnea, PND, lower extremity edema, slurred speech, facial asymmetry, focal numbness or weakness, change to urine output, diaphoresis, nausea, vomiting, back pain.    She believes that her blood pressure has been elevated because she has been eating a lot of salty foods lately.  Denies any urinary symptoms such as dysuria, hematuria, urinary frequency or urgency.    A flowsheet of ambulatory vital signs have been reviewed back to 2016, blood pressure has varied from 118/60 - 152/92  Last lab work for  cholesterol was done December 2017, LDL was 100, HDL 45, she is on simvastatin 10 mg and tolerating well.   Review of Systems  Constitution: Negative. Negative for chills, decreased appetite, diaphoresis, fever, malaise/fatigue, night sweats, weight gain and weight loss.  HENT: Negative.  Negative for tinnitus.   Eyes: Negative for blurred vision, discharge, double vision, photophobia, vision loss in left eye, vision loss in right eye, visual disturbance and visual halos.       No change to baseline vision, hx of glaucoma  Cardiovascular: Negative for chest pain, claudication, cyanosis, dyspnea on exertion, irregular heartbeat, leg swelling, near-syncope, orthopnea, palpitations, paroxysmal nocturnal dyspnea and syncope.  Respiratory: Negative.  Negative for cough, hemoptysis, shortness of breath, sleep disturbances due to breathing, snoring, sputum production and wheezing.   Endocrine: Negative.  Negative for cold intolerance, heat intolerance, polydipsia, polyphagia and polyuria.  Hematologic/Lymphatic: Negative.   Skin: Negative.  Negative for color change, dry skin and nail changes.  Musculoskeletal: Negative.  Negative for back pain, joint pain, joint swelling, muscle cramps, muscle weakness, myalgias and neck pain.  Gastrointestinal: Negative.  Negative for bloating, abdominal pain, hematemesis, hematochezia, hemorrhoids, jaundice, melena, nausea and vomiting.  Genitourinary: Negative.  Negative for dysuria, frequency, hematuria, hesitancy, nocturia and urgency.  Neurological: Negative for aphonia, brief paralysis, difficulty with concentration, disturbances in coordination, dizziness, focal weakness, headaches, light-headedness, numbness, paresthesias, sensory change, tremors and weakness.  Psychiatric/Behavioral: Negative.  Negative for altered mental status  and memory loss. The patient does not have insomnia and is not nervous/anxious.   Allergic/Immunologic: Negative.       Patient  Active Problem List   Diagnosis Date Noted  . 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 Hypertension   . Hyperlipidemia     Current Outpatient Medications:  .  aspirin 81 MG tablet, Take 81 mg by mouth daily., Disp: , Rfl:  .  Calcium Citrate-Vitamin D (CALCIUM + D PO), Take 1 tablet by mouth daily., Disp: , Rfl:  .  HYDROcodone-acetaminophen (NORCO) 5-325 MG tablet, Take 1 tablet by mouth every 6 (six) hours as needed for moderate pain., Disp: 30 tablet, Rfl: 0 .  losartan-hydrochlorothiazide (HYZAAR) 50-12.5 MG tablet, Take 1 tablet by mouth daily., Disp: 90 tablet, Rfl: 3 .  meloxicam (MOBIC) 15 MG tablet, TAKE 1 TABLET BY MOUTH EVERY DAY, Disp: 90 tablet, Rfl: 1 .  omeprazole (PRILOSEC) 40 MG capsule, Take 1 capsule (40 mg total) by mouth daily., Disp: 90 capsule, Rfl: 3 .  simvastatin (ZOCOR) 10 MG tablet, Take 1 tablet (10 mg total) by mouth at bedtime., Disp: 90 tablet, Rfl: 3 .  Vitamin D, Cholecalciferol, 1000 UNITS CAPS, Take 1,000 mg by mouth., Disp: , Rfl:  .  amLODipine (NORVASC) 5 MG tablet, Take 1 tablet (5 mg total) by mouth daily., Disp: 30 tablet, Rfl: 0   Allergies  Allergen Reactions  . Penicillins      Family History  Problem Relation Age of Onset  . Stroke Mother   . Kidney failure Father   . Diabetes Brother   . Cancer Brother        Lung - Smoker  . Lung cancer Brother   . Cervical cancer Sister   . Cancer Sister   . Cancer Sister        ?Cervical CA             Objective:    There were no vitals filed for this visit.    Physical Exam  Constitutional: She is oriented to person, place, and time. She appears well-developed and well-nourished.  Non-toxic appearance. She does not have a sickly appearance. She does not appear ill. No distress.  HENT:  Head: Normocephalic and atraumatic.  Right Ear: External ear normal.  Left Ear: External ear normal.  Nose: Nose normal.  Mouth/Throat: Oropharynx is clear and moist. No oropharyngeal exudate.    Eyes: Conjunctivae, EOM and lids are normal. Lids are everted and swept, no foreign bodies found. Right eye exhibits no discharge. Left eye exhibits no discharge. No scleral icterus. Right eye exhibits normal extraocular motion and no nystagmus. Left eye exhibits normal extraocular motion and no nystagmus.  Neck: Trachea normal, normal range of motion and phonation normal. Neck supple. No JVD present. No spinous process tenderness and no muscular tenderness present. Carotid bruit is not present. No neck rigidity. No tracheal deviation and normal range of motion present.  Cardiovascular: Normal rate, regular rhythm, normal heart sounds and intact distal pulses. Exam reveals no gallop and no friction rub.  No murmur heard. Pulses:      Radial pulses are 2+ on the right side, and 2+ on the left side.       Dorsalis pedis pulses are 2+ on the right side, and 2+ on the left side.       Posterior tibial pulses are 2+ on the right side, and 2+ on the left side.  No LE edema  Pulmonary/Chest: Effort normal and breath sounds  normal. No stridor. No respiratory distress. She has no wheezes. She has no rales. She exhibits no tenderness.  Abdominal: Soft. Bowel sounds are normal. She exhibits no distension. There is no tenderness.  Musculoskeletal: Normal range of motion.  Lymphadenopathy:    She has no cervical adenopathy.  Neurological: She is alert and oriented to person, place, and time. She exhibits normal muscle tone. Coordination normal.  MENTAL STATUS: AAOx3, memory intact, fund of knowledge appropriate  LANG/SPEECH: Naming and repetition intact, fluent, follows 3-step commands  CRANIAL NERVES:   II: Pupils asymmetrical and minimally reactive (chronic with past eye procedures, and medications for glaucoma)   III, IV, VI: EOM intact, no gaze preference or deviation, no nystagmus.   V: normal sensation in V1, V2, and V3 segments bilaterally   VII: no asymmetry, no nasolabial fold flattening    VIII: normal hearing to speech   IX, X: normal palatal elevation, no uvular deviation   XI: 5/5 head turn and 5/5 shoulder shrug bilaterally   XII: midline tongue protrusion  MOTOR:  5/5 bilateral grip strength 5/5 strength dorsiflexion/plantarflexion b/l  SENSORY:  Normal to light touch Romberg absent  COORD: Normal finger to nose and heel to shin, no tremor, no dysmetria  STATION: normal stance, no truncal ataxia  GAIT: Normal, normal heel-walk.  Skin: Skin is warm and dry. Capillary refill takes less than 2 seconds. No rash noted. She is not diaphoretic.  Psychiatric: She has a normal mood and affect. Her behavior is normal. Judgment and thought content normal.  Nursing note and vitals reviewed.   EKG was sinus rhythm with poor R wave progression and some nonspecific T wave abnormality and possible very slight J-point elevation in V1 and V2 without any reciprocal changes  Orthostatic Blood Pressure:  Lying:               180/92             71HR               97%  Sitting:             178/90             76HR               97%  Standing:          184/88            82HR               98%  Standing x2:    178/88             78HR               99%         Assessment & Plan:      ICD-10-CM   1. Vertigo R42 Urinalysis, Routine w reflex microscopic  2. Hypertensive urgency I16.0 Urinalysis, Routine w reflex microscopic    BASIC METABOLIC PANEL WITH GFR    EKG 12-Lead  3. Encounter for immunization Z23 Flu vaccine HIGH DOSE PF    Patient with complaints of dizziness she describes as feeling swimmy headed with some associated balance problems, believes it is from her blood pressure.  Blood pressure is elevated today, she does attribute it to eating a lot of salty chips but otherwise has been compliant her medication.  Per reviewing her chart she has never had blood pressures this high.  She has no focal neurological deficits on exam, heart  and lung exams also  unremarkable.  She has no symptoms other than the dizziness or swimmy-headedness that are concerning for any hypertensive endorgan damage.  Orthostatics were negative, patient does appear euvolemic, will add on Norvasc, she was encouraged to avoid salty foods and chips, encouraged to monitor her blood pressure at home, BMP and UA pending.  EKG was sinus rhythm with poor R wave progression and some nonspecific T wave abnormality and possible very slight J-point elevation in V1 and V2 without any reciprocal changes.  No EKGs available in the chart to compare to any past EKGs.  She has no other symptoms concerning for ACS or demand ischemia.   Recheck her in 1 to 2 weeks to adjust medications.  Strict return precautions and ER precautions reviewed with the patient at length who verbalizes understanding.   Delsa Grana, PA-C 09/09/18 10:25 AM

## 2018-09-10 LAB — BASIC METABOLIC PANEL WITH GFR
BUN/Creatinine Ratio: 24 (calc) — ABNORMAL HIGH (ref 6–22)
BUN: 25 mg/dL (ref 7–25)
CALCIUM: 9.9 mg/dL (ref 8.6–10.4)
CO2: 28 mmol/L (ref 20–32)
CREATININE: 1.04 mg/dL — AB (ref 0.60–0.93)
Chloride: 102 mmol/L (ref 98–110)
GFR, Est African American: 60 mL/min/{1.73_m2} (ref >=60)
GFR, Est Non African American: 52 mL/min/{1.73_m2} — ABNORMAL LOW (ref >=60)
GLUCOSE: 93 mg/dL (ref 65–99)
Potassium: 4.3 mmol/L (ref 3.5–5.3)
Sodium: 137 mmol/L (ref 135–146)

## 2018-09-17 ENCOUNTER — Encounter: Payer: Self-pay | Admitting: Family Medicine

## 2018-09-17 ENCOUNTER — Ambulatory Visit (INDEPENDENT_AMBULATORY_CARE_PROVIDER_SITE_OTHER): Payer: Medicare HMO | Admitting: Family Medicine

## 2018-09-17 VITALS — BP 148/82 | HR 82 | Temp 98.4°F | Resp 16 | Ht 64.0 in | Wt 196.0 lb

## 2018-09-17 DIAGNOSIS — R3915 Urgency of urination: Secondary | ICD-10-CM | POA: Diagnosis not present

## 2018-09-17 DIAGNOSIS — I160 Hypertensive urgency: Secondary | ICD-10-CM

## 2018-09-17 DIAGNOSIS — R42 Dizziness and giddiness: Secondary | ICD-10-CM

## 2018-09-17 LAB — URINALYSIS, ROUTINE W REFLEX MICROSCOPIC
BILIRUBIN URINE: NEGATIVE
Glucose, UA: NEGATIVE
HGB URINE DIPSTICK: NEGATIVE
NITRITE: NEGATIVE
RBC / HPF: NONE SEEN /HPF (ref 0–2)
Specific Gravity, Urine: 1.025 (ref 1.001–1.03)
pH: 5.5 (ref 5.0–8.0)

## 2018-09-17 LAB — MICROSCOPIC MESSAGE

## 2018-09-17 MED ORDER — MECLIZINE HCL 12.5 MG PO TABS: 12.5000 mg | ORAL_TABLET | Freq: Three times a day (TID) | ORAL | 0 refills | Status: DC | PRN

## 2018-09-17 NOTE — Patient Instructions (Addendum)
Recheck your blood pressure if you can while you are out running errands Write them down and bring them in in a week or two.  Please take both blood pressure medicines together.  Call me or come in if BP >160/100  Or  < 100/60

## 2018-09-17 NOTE — Progress Notes (Signed)
Patient ID: Carla Wells, female    DOB: March 01, 1941, 77 y.o.   MRN: [id number]  PCP: Carla Wells, MDf  Chief Complaint  Patient presents with  . Hypertension    Subjective:   Carla Wells is a 77 y.o. female, presents to clinic with CC of follow-up for hypertensive urgency and vertigo symptoms.  She did take her blood pressure medication for a few days over the last week and she felt much better with norvasc added to normal meds - hydrochlorothiazide and losartan.  She did not take the Norvasc this morning because she was not sure if she is supposed to take it together.  She noted that her swimmy headedness symptoms did improve throughout the week.  She also notes that she avoided a lot of salty foods worked on the diet and exercise and did not generally feel better.  Somehow last week she says she left a urine sample but we could not locate it anywhere and the restroom or clinic so she is leaving a sample again today to make sure that she does not have a urinary tract infection that would be causing some of her symptoms.  Her blood pressure is improved today, elevated 148/82.   She would like to reschedule with her PCP, Dr. Dennard Schaumann for a Medicare well visit and all of her other routine care, and would not like to do this today.   Patient Active Problem List   Diagnosis Date Noted  . 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 Hypertension   . Hyperlipidemia     Prior to Admission medications   Medication Sig Start Date End Date Taking? Authorizing Provider  amLODipine (NORVASC) 5 MG tablet Take 1 tablet (5 mg total) by mouth daily. 09/09/18  Yes Delsa Grana, PA-C  aspirin 81 MG tablet Take 81 mg by mouth daily.   Yes [provider]  Calcium Citrate-Vitamin D (CALCIUM + D PO) Take 1 tablet by mouth daily.   Yes [provider]  HYDROcodone-acetaminophen (NORCO) 5-325 MG tablet Take 1 tablet by mouth every 6 (six) hours as needed for moderate pain. 04/24/18  Yes  Carla Frizzle, MD  losartan-hydrochlorothiazide (HYZAAR) 50-12.5 MG tablet Take 1 tablet by mouth daily. 05/13/18  Yes Carla Frizzle, MD  meloxicam (MOBIC) 15 MG tablet TAKE 1 TABLET BY MOUTH EVERY DAY 05/21/18  Yes Carla Frizzle, MD  omeprazole (PRILOSEC) 40 MG capsule Take 1 capsule (40 mg total) by mouth daily. 05/13/18  Yes Carla Frizzle, MD  simvastatin (ZOCOR) 10 MG tablet Take 1 tablet (10 mg total) by mouth at bedtime. 04/24/18  Yes Carla Frizzle, MD  Vitamin D, Cholecalciferol, 1000 UNITS CAPS Take 1,000 mg by mouth.   Yes [provider]     Allergies  Allergen Reactions  . Penicillins            Review of Systems     Objective:    Vitals:   09/17/18 0929  BP: (!) 148/82  Pulse: 82  Resp: 16  Temp: 98.4 F (36.9 C)  TempSrc: Oral  SpO2: 100%  Weight: 196 lb (88.9 kg)  Height: 5\' 4"  (1.626 m)      Physical Exam        Assessment & Plan:   1. Hypertensive urgency Blood pressures improved today but not at goal, patient did take Norvasc with her losartan and HCTZ over the past week, but did not take it this morning, will have  her take her normal blood pressure medicine in the morning and Norvasc at night and monitor, patient was given a blood pressure log and parameters. She has a follow-up with her PCP in 3 weeks, feel that she can wait 3 weeks to see her PCP as long as her blood pressures stay within the parameters and she was instructed to call us and schedule a sooner office visit to recheck if they are >160/100 or < 100/60.  2. Vertigo Symptoms did improve over the past week but not completely resolved, urine sample obtained today successfully and dip was positive for leukocytes, trace protein and ketones, will run urine culture to ensure that there is not any UTI causing some of her vertigo symptoms.  We will also start a trial of meclizine.  Advised to try medicine at home in the evening, not to drive with medicine. - Urine  Culture - meclizine (ANTIVERT) 12.5 MG tablet; Take 1 tablet (12.5 mg total) by mouth 3 (three) times daily as needed for dizziness.  Dispense: 30 tablet; Refill: 0      Delsa Grana, PA-C 09/17/18 10:01 AM

## 2018-09-19 LAB — URINE CULTURE
MICRO NUMBER: 91249533
SPECIMEN QUALITY:: ADEQUATE

## 2018-10-06 DIAGNOSIS — H401131 Primary open-angle glaucoma, bilateral, mild stage: Secondary | ICD-10-CM | POA: Diagnosis not present

## 2018-10-08 ENCOUNTER — Ambulatory Visit (INDEPENDENT_AMBULATORY_CARE_PROVIDER_SITE_OTHER): Payer: Medicare HMO | Admitting: Family Medicine

## 2018-10-08 ENCOUNTER — Encounter: Payer: Self-pay | Admitting: Family Medicine

## 2018-10-08 ENCOUNTER — Other Ambulatory Visit: Payer: Self-pay | Admitting: *Deleted

## 2018-10-08 VITALS — BP 154/80 | HR 86 | Temp 97.5°F | Resp 16 | Ht 64.0 in | Wt 210.0 lb

## 2018-10-08 DIAGNOSIS — Z0000 Encounter for general adult medical examination without abnormal findings: Secondary | ICD-10-CM

## 2018-10-08 DIAGNOSIS — M85852 Other specified disorders of bone density and structure, left thigh: Secondary | ICD-10-CM

## 2018-10-08 DIAGNOSIS — E7800 Pure hypercholesterolemia, unspecified: Secondary | ICD-10-CM | POA: Diagnosis not present

## 2018-10-08 DIAGNOSIS — I10 Essential (primary) hypertension: Secondary | ICD-10-CM

## 2018-10-08 DIAGNOSIS — I160 Hypertensive urgency: Secondary | ICD-10-CM

## 2018-10-08 DIAGNOSIS — M85851 Other specified disorders of bone density and structure, right thigh: Secondary | ICD-10-CM | POA: Diagnosis not present

## 2018-10-08 LAB — COMPLETE METABOLIC PANEL WITH GFR
AG RATIO: 1.4 (calc) (ref 1.0–2.5)
ALKALINE PHOSPHATASE (APISO): 131 U/L — AB (ref 33–130)
ALT: 20 U/L (ref 6–29)
AST: 17 U/L (ref 10–35)
Albumin: 4.1 g/dL (ref 3.6–5.1)
BUN/Creatinine Ratio: 25 (calc) — ABNORMAL HIGH (ref 6–22)
BUN: 28 mg/dL — ABNORMAL HIGH (ref 7–25)
CO2: 28 mmol/L (ref 20–32)
CREATININE: 1.12 mg/dL — AB (ref 0.60–0.93)
Calcium: 10.1 mg/dL (ref 8.6–10.4)
Chloride: 103 mmol/L (ref 98–110)
GFR, Est African American: 55 mL/min/{1.73_m2} — ABNORMAL LOW (ref >=60)
GFR, Est Non African American: 47 mL/min/{1.73_m2} — ABNORMAL LOW (ref >=60)
GLOBULIN: 3 g/dL (ref 1.9–3.7)
Glucose, Bld: 106 mg/dL — ABNORMAL HIGH (ref 65–99)
POTASSIUM: 4.5 mmol/L (ref 3.5–5.3)
Sodium: 139 mmol/L (ref 135–146)
Total Bilirubin: 0.4 mg/dL (ref 0.2–1.2)
Total Protein: 7.1 g/dL (ref 6.1–8.1)

## 2018-10-08 LAB — CBC WITH DIFFERENTIAL/PLATELET
BASOS PCT: 0.9 %
Basophils Absolute: 59 cells/uL (ref 0–200)
EOS PCT: 2.3 %
Eosinophils Absolute: 152 cells/uL (ref 15–500)
HCT: 41.8 % (ref 35.0–45.0)
Hemoglobin: 13.9 g/dL (ref 11.7–15.5)
LYMPHS ABS: 2092 {cells}/uL (ref 850–3900)
MCH: 27.4 pg (ref 27.0–33.0)
MCHC: 33.3 g/dL (ref 32.0–36.0)
MCV: 82.4 fL (ref 80.0–100.0)
MPV: 12.5 fL (ref 7.5–12.5)
Monocytes Relative: 9.6 %
Neutro Abs: 3663 cells/uL (ref 1500–7800)
Neutrophils Relative %: 55.5 %
PLATELETS: 263 10*3/uL (ref 140–400)
RBC: 5.07 10*6/uL (ref 3.80–5.10)
RDW: 13.8 % (ref 11.0–15.0)
TOTAL LYMPHOCYTE: 31.7 %
WBC: 6.6 10*3/uL (ref 3.8–10.8)
WBCMIX: 634 {cells}/uL (ref 200–950)

## 2018-10-08 LAB — LIPID PANEL
CHOLESTEROL: 158 mg/dL (ref <200)
HDL: 48 mg/dL — AB (ref >50)
LDL Cholesterol (Calc): 93 mg/dL (calc)
Non-HDL Cholesterol (Calc): 110 mg/dL (calc) (ref <130)
Total CHOL/HDL Ratio: 3.3 (calc) (ref <5.0)
Triglycerides: 79 mg/dL (ref <150)

## 2018-10-08 MED ORDER — AMLODIPINE BESYLATE 5 MG PO TABS: 5.0000 mg | ORAL_TABLET | Freq: Every day | ORAL | 0 refills | Status: DC

## 2018-10-08 NOTE — Progress Notes (Signed)
Subjective:    Patient ID: Carla Wells, female    DOB: [birth year], 77 y.o.   MRN: [id number]   Patient is here today for complete physical exam.  Her last colonoscopy was in 2010.  She had a bone density test performed in 2018.  Due to her age, she does not require Pap smear.  Her last mammogram was in October 2018 and is due again.  Patient has a scheduled for March of next year.  She is due for a tetanus shot.  The remainder of her vaccinations are up-to-date including her flu shot, Prevnar 13, Pneumovax 23.  She is due for the shingles vaccine along with some fasting lab work.  Recommended the next time she was at her pharmacy, that she check on the price of the shingles shot and see if it is affordable.  I am concerned about her blood pressure today.  I repeated this myself and found to be 160/80.  I believe the patient has an element of whitecoat syndrome.  However I also think there is a high likelihood that her blood pressure is elevated.  She states that she has not taken her amlodipine yet this morning.  I have asked the patient to come in either tomorrow or first of next week taken all of her medication and let us recheck her blood pressure without an appointment just to see if there is any whitecoat syndrome at play.  If her blood pressure is still elevated at that time, I would add doxazosin Immunization History  Administered Date(s) Administered  . Influenza Split 11/06/2012  . Influenza, High Dose Seasonal PF 09/09/2018  . Influenza,inj,Quad PF,6+ Mos 09/16/2013, 09/06/2014, 09/07/2015, 09/26/2016, 09/11/2017  . Pneumococcal Conjugate-13 11/04/2013  . Pneumococcal Polysaccharide-23 10/03/2010    Past Medical History:  Diagnosis Date  . Abnormal pap   . Cataract   . Colon polyps   . Hyperlipidemia   . Hypertension   . Murmur    No past surgical history on file. Current Outpatient Medications on File Prior to Visit  Medication Sig Dispense Refill  . amLODipine (NORVASC) 5  MG tablet Take 1 tablet (5 mg total) by mouth daily. 30 tablet 0  . aspirin 81 MG tablet Take 81 mg by mouth daily.    . Calcium Citrate-Vitamin D (CALCIUM + D PO) Take 1 tablet by mouth daily.    Marland Kitchen losartan-hydrochlorothiazide (HYZAAR) 50-12.5 MG tablet Take 1 tablet by mouth daily. 90 tablet 3  . meloxicam (MOBIC) 15 MG tablet TAKE 1 TABLET BY MOUTH EVERY DAY 90 tablet 1  . omeprazole (PRILOSEC) 40 MG capsule Take 1 capsule (40 mg total) by mouth daily. 90 capsule 3  . simvastatin (ZOCOR) 10 MG tablet Take 1 tablet (10 mg total) by mouth at bedtime. 90 tablet 3  . Vitamin D, Cholecalciferol, 1000 UNITS CAPS Take 1,000 mg by mouth.     No current facility-administered medications on file prior to visit.    Allergies  Allergen Reactions  . Penicillins    Social History   Socioeconomic History  . Marital status: Single    Spouse name: Not on file  . Number of children: Not on file  . Years of education: Not on file  . Highest education level: Not on file  Occupational History  . Not on file  Social Needs  . Financial resource strain: Not on file  . Food insecurity:    Worry: Not on file    Inability: Not on file  .  Transportation needs:    Medical: Not on file    Non-medical: Not on file  Tobacco Use  . Smoking status: Never Smoker  . Smokeless tobacco: Never Used  Substance and Sexual Activity  . Alcohol use: No  . Drug use: No  . Sexual activity: Not on file  Lifestyle  . Physical activity:    Days per week: Not on file    Minutes per session: Not on file  . Stress: Not on file  Relationships  . Social connections:    Talks on phone: Not on file    Gets together: Not on file    Attends religious service: Not on file    Active member of club or organization: Not on file    Attends meetings of clubs or organizations: Not on file    Relationship status: Not on file  . Intimate partner violence:    Fear of current or ex partner: Not on file    Emotionally abused:  Not on file    Physically abused: Not on file    Forced sexual activity: Not on file  Other Topics Concern  . Not on file  Social History Narrative  . Not on file    Family History  Problem Relation Age of Onset  . Stroke Mother   . Kidney failure Father   . Diabetes Brother   . Cancer Brother        Lung - Smoker  . Lung cancer Brother   . Cervical cancer Sister   . Cancer Sister   . Cancer Sister        ?Cervical CA      Review of Systems  All other systems reviewed and are negative.      Objective:   Physical Exam  Constitutional: She is oriented to person, place, and time. She appears well-developed and well-nourished. No distress.  HENT:  Head: Normocephalic and atraumatic.  Right Ear: External ear normal.  Left Ear: External ear normal.  Nose: Nose normal.  Mouth/Throat: Oropharynx is clear and moist. No oropharyngeal exudate.  Eyes: Pupils are equal, round, and reactive to light. Conjunctivae and EOM are normal. Right eye exhibits no discharge. Left eye exhibits no discharge. No scleral icterus.  Neck: Normal range of motion. Neck supple. No JVD present. No tracheal deviation present. No thyromegaly present.  Cardiovascular: Normal rate, regular rhythm, normal heart sounds and intact distal pulses. Exam reveals no gallop and no friction rub.  No murmur heard. Pulmonary/Chest: Effort normal and breath sounds normal. No stridor. No respiratory distress. She has no wheezes. She has no rales. She exhibits no tenderness.  Abdominal: Soft. Bowel sounds are normal. She exhibits no distension and no mass. There is no tenderness. There is no rebound and no guarding.  Musculoskeletal: Normal range of motion. She exhibits no edema or tenderness.  Lymphadenopathy:    She has no cervical adenopathy.  Neurological: She is alert and oriented to person, place, and time. She has normal reflexes. No cranial nerve deficit. She exhibits normal muscle tone. Coordination normal.    Skin: Skin is warm. No rash noted. She is not diaphoretic. No erythema. No pallor.  Psychiatric: She has a normal mood and affect. Her behavior is normal. Judgment and thought content normal.  Vitals reviewed.         Assessment & Plan:  General medical exam - Plan: CBC with Differential/Platelet, COMPLETE METABOLIC PANEL WITH GFR, Lipid panel  Benign essential HTN - Plan: CBC with Differential/Platelet, COMPLETE  METABOLIC PANEL WITH GFR, Lipid panel  Pure hypercholesterolemia - Plan: CBC with Differential/Platelet, COMPLETE METABOLIC PANEL WITH GFR, Lipid panel  Osteopenia of both hips  Sickle exam today is normal.  Review of systems is negative for any concerns.  Memory evaluation is normal.  Patient denies any problems with falls or depression.  I will obtain fasting lab work including a CBC, CMP, fasting lipid panel.  I recommended the shingles vaccine.  I have recommended a mammogram and she has a scheduled for March.  She has declined further colonoscopies at this point in her life.  Her bone density test is not due to be rechecked until 2020.  I recommended a tetanus vaccine but she declined.  I have recommended the patient come in either tomorrow or Monday without seeing the doctor to let us recheck the blood pressure on a normal day after she is taking all of her medication.  If her blood pressure remains elevated, I would add doxazosin.

## 2018-10-15 ENCOUNTER — Ambulatory Visit: Payer: Medicare HMO

## 2018-10-15 ENCOUNTER — Other Ambulatory Visit: Payer: Self-pay | Admitting: Family Medicine

## 2018-10-15 ENCOUNTER — Telehealth: Payer: Self-pay | Admitting: Family Medicine

## 2018-10-15 VITALS — BP 150/80

## 2018-10-15 DIAGNOSIS — Z0130 Encounter for examination of blood pressure without abnormal findings: Secondary | ICD-10-CM

## 2018-10-15 MED ORDER — DOXAZOSIN MESYLATE 2 MG PO TABS: 2.0000 mg | ORAL_TABLET | Freq: Every day | ORAL | 2 refills | Status: DC

## 2018-10-15 NOTE — Telephone Encounter (Signed)
Patient came in today for a blood pressure check. Her blood pressure reading was 150/80. I had patient sit for 10 minutes and I rechecked and her blood pressure was still 150/80. Please advise?

## 2018-10-15 NOTE — Telephone Encounter (Signed)
Spoke with patient and informed her that we were going to call in Doxazosin 2 mg. Patient verbalized understanding.

## 2018-10-15 NOTE — Progress Notes (Signed)
Patient came in today for a blood pressure check. Patient's blood pressure was 150/80 in the left arm with a normal size cuff while sitting. I had patient sit in quiet exam room for 10 minutes and rechecked and value was the same 150/80. Advised patient I would notify her pcp.

## 2018-10-15 NOTE — Telephone Encounter (Signed)
Add doxazosin 2 mg a day.

## 2018-10-16 ENCOUNTER — Other Ambulatory Visit: Payer: Self-pay | Admitting: Family Medicine

## 2018-11-06 ENCOUNTER — Other Ambulatory Visit: Payer: Self-pay | Admitting: Family Medicine

## 2018-11-06 DIAGNOSIS — I160 Hypertensive urgency: Secondary | ICD-10-CM

## 2018-11-10 ENCOUNTER — Other Ambulatory Visit: Payer: Self-pay | Admitting: *Deleted

## 2018-11-16 ENCOUNTER — Other Ambulatory Visit: Payer: Self-pay | Admitting: Family Medicine

## 2018-11-16 DIAGNOSIS — M75102 Unspecified rotator cuff tear or rupture of left shoulder, not specified as traumatic: Secondary | ICD-10-CM

## 2018-11-29 ENCOUNTER — Other Ambulatory Visit: Payer: Self-pay | Admitting: Family Medicine

## 2018-11-29 DIAGNOSIS — I160 Hypertensive urgency: Secondary | ICD-10-CM

## 2018-12-15 ENCOUNTER — Encounter: Payer: Self-pay | Admitting: Family Medicine

## 2018-12-15 ENCOUNTER — Ambulatory Visit (INDEPENDENT_AMBULATORY_CARE_PROVIDER_SITE_OTHER): Payer: Medicare HMO | Admitting: Family Medicine

## 2018-12-15 ENCOUNTER — Ambulatory Visit (HOSPITAL_COMMUNITY)
Admission: RE | Admit: 2018-12-15 | Discharge: 2018-12-15 | Disposition: A | Discharge status: Home / Self Care | Payer: Medicare HMO | Source: Ambulatory Visit | Attending: Family Medicine | Admitting: Family Medicine

## 2018-12-15 VITALS — BP 120/62 | HR 107 | Temp 98.2°F | Resp 22 | Ht 64.0 in | Wt 204.2 lb

## 2018-12-15 DIAGNOSIS — R6883 Chills (without fever): Secondary | ICD-10-CM

## 2018-12-15 DIAGNOSIS — R350 Frequency of micturition: Secondary | ICD-10-CM | POA: Diagnosis not present

## 2018-12-15 DIAGNOSIS — R05 Cough: Secondary | ICD-10-CM | POA: Diagnosis not present

## 2018-12-15 DIAGNOSIS — R0602 Shortness of breath: Secondary | ICD-10-CM | POA: Diagnosis not present

## 2018-12-15 DIAGNOSIS — M791 Myalgia, unspecified site: Secondary | ICD-10-CM

## 2018-12-15 LAB — CBC WITH DIFFERENTIAL/PLATELET
Absolute Monocytes: 1498 cells/uL — ABNORMAL HIGH (ref 200–950)
BASOS ABS: 70 {cells}/uL (ref 0–200)
BASOS PCT: 0.6 %
EOS ABS: 82 {cells}/uL (ref 15–500)
Eosinophils Relative: 0.7 %
HCT: 37 % (ref 35.0–45.0)
Hemoglobin: 12.2 g/dL (ref 11.7–15.5)
Lymphs Abs: 2293 cells/uL (ref 850–3900)
MCH: 27.4 pg (ref 27.0–33.0)
MCHC: 33 g/dL (ref 32.0–36.0)
MCV: 83 fL (ref 80.0–100.0)
MPV: 11.7 fL (ref 7.5–12.5)
Monocytes Relative: 12.8 %
NEUTROS PCT: 66.3 %
Neutro Abs: 7757 cells/uL (ref 1500–7800)
Platelets: 304 10*3/uL (ref 140–400)
RBC: 4.46 10*6/uL (ref 3.80–5.10)
RDW: 13.9 % (ref 11.0–15.0)
Total Lymphocyte: 19.6 %
WBC: 11.7 10*3/uL — ABNORMAL HIGH (ref 3.8–10.8)

## 2018-12-15 LAB — URINALYSIS, ROUTINE W REFLEX MICROSCOPIC
Bilirubin Urine: NEGATIVE
GLUCOSE, UA: NEGATIVE
HGB URINE DIPSTICK: NEGATIVE
Ketones, ur: NEGATIVE
Nitrite: NEGATIVE
Protein, ur: NEGATIVE
RBC / HPF: NONE SEEN /HPF (ref 0–2)
Specific Gravity, Urine: 1.02 (ref 1.001–1.03)
pH: 6 (ref 5.0–8.0)

## 2018-12-15 LAB — MICROSCOPIC MESSAGE

## 2018-12-15 LAB — INFLUENZA A AND B AG, IMMUNOASSAY
INFLUENZA A ANTIGEN: NOT DETECTED
INFLUENZA B ANTIGEN: NOT DETECTED

## 2018-12-15 LAB — COMPLETE METABOLIC PANEL WITH GFR
AG RATIO: 1 (calc) (ref 1.0–2.5)
ALT: 22 U/L (ref 6–29)
AST: 22 U/L (ref 10–35)
Albumin: 3.4 g/dL — ABNORMAL LOW (ref 3.6–5.1)
Alkaline phosphatase (APISO): 88 U/L (ref 33–130)
BUN/Creatinine Ratio: 14 (calc) (ref 6–22)
BUN: 23 mg/dL (ref 7–25)
CALCIUM: 9.1 mg/dL (ref 8.6–10.4)
CHLORIDE: 100 mmol/L (ref 98–110)
CO2: 23 mmol/L (ref 20–32)
Creat: 1.59 mg/dL — ABNORMAL HIGH (ref 0.60–0.93)
GFR, EST NON AFRICAN AMERICAN: 31 mL/min/{1.73_m2} — AB (ref >=60)
GFR, Est African American: 36 mL/min/{1.73_m2} — ABNORMAL LOW (ref >=60)
Globulin: 3.3 g/dL (calc) (ref 1.9–3.7)
Glucose, Bld: 111 mg/dL — ABNORMAL HIGH (ref 65–99)
POTASSIUM: 4 mmol/L (ref 3.5–5.3)
Sodium: 133 mmol/L — ABNORMAL LOW (ref 135–146)
Total Bilirubin: 0.3 mg/dL (ref 0.2–1.2)
Total Protein: 6.7 g/dL (ref 6.1–8.1)

## 2018-12-15 MED ORDER — CEPHALEXIN 500 MG PO CAPS: 500.0000 mg | ORAL_CAPSULE | Freq: Four times a day (QID) | ORAL | 0 refills | Status: AC

## 2018-12-15 NOTE — Progress Notes (Signed)
Patient ID: Carla Wells, female    DOB: Mar 24, 1941, 78 y.o.   MRN: [id number]  PCP: Susy Frizzle, MD  Chief Complaint  Patient presents with  . Cough    Patient in today with c/o cough, headache, cold chills, hot flashes, body aches. Onset of symptoms 1 week ago     Subjective:   Carla Wells is a 78 y.o. female, presents to clinic with CC of several vague sx 1 week.  She complained to the nurse of cough, HA, hot/cold chills, body aches, to me she denies any URI sx, she denies cp, cough, wheeze, but she does state she feels "short winded" just sitting here, although no difficulty with exertion.  She also mentions urinary frequency w/o abd pain, back pain, hematuria, dysuria.  She just feels generally unwell.  No known sick contacts.      Patient Active Problem List   Diagnosis Date Noted  . 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 Hypertension   . Hyperlipidemia      Prior to Admission medications   Medication Sig Start Date End Date Taking? Authorizing Provider  amLODipine (NORVASC) 5 MG tablet TAKE 1 TABLET BY MOUTH EVERY DAY 11/30/18  Yes Susy Frizzle, MD  aspirin 81 MG tablet Take 81 mg by mouth daily.   Yes [provider]  Calcium Citrate-Vitamin D (CALCIUM + D PO) Take 1 tablet by mouth daily.   Yes [provider]  doxazosin (CARDURA) 2 MG tablet TAKE 1 TABLET BY MOUTH EVERY DAY 10/16/18  Yes Susy Frizzle, MD  losartan-hydrochlorothiazide (HYZAAR) 50-12.5 MG tablet Take 1 tablet by mouth daily. 05/13/18  Yes Susy Frizzle, MD  meloxicam (MOBIC) 15 MG tablet TAKE 1 TABLET BY MOUTH EVERY DAY 11/16/18  Yes Susy Frizzle, MD  omeprazole (PRILOSEC) 40 MG capsule Take 1 capsule (40 mg total) by mouth daily. 05/13/18  Yes Susy Frizzle, MD  simvastatin (ZOCOR) 10 MG tablet Take 1 tablet (10 mg total) by mouth at bedtime. 04/24/18  Yes Susy Frizzle, MD  Vitamin D, Cholecalciferol, 1000 UNITS CAPS Take 1,000 mg by mouth.   Yes  [provider]     Allergies  Allergen Reactions  . Penicillins      Family History  Problem Relation Age of Onset  . Stroke Mother   . Kidney failure Father   . Diabetes Brother   . Cancer Brother        Lung - Smoker  . Lung cancer Brother   . Cervical cancer Sister   . Cancer Sister   . Cancer Sister        ?Cervical CA     Social History   Socioeconomic History  . Marital status: Single    Spouse name: Not on file  . Number of children: Not on file  . Years of education: Not on file  . Highest education level: Not on file  Occupational History  . Not on file  Social Needs  . Financial resource strain: Not on file  . Food insecurity:    Worry: Not on file    Inability: Not on file  . Transportation needs:    Medical: Not on file    Non-medical: Not on file  Tobacco Use  . Smoking status: Never Smoker  . Smokeless tobacco: Former Systems developer    Types: Snuff  Substance and Sexual Activity  . Alcohol use: No  . Drug use: No  . Sexual  activity: Not on file  Lifestyle  . Physical activity:    Days per week: Not on file    Minutes per session: Not on file  . Stress: Not on file  Relationships  . Social connections:    Talks on phone: Not on file    Gets together: Not on file    Attends religious service: Not on file    Active member of club or organization: Not on file    Attends meetings of clubs or organizations: Not on file    Relationship status: Not on file  . Intimate partner violence:    Fear of current or ex partner: Not on file    Emotionally abused: Not on file    Physically abused: Not on file    Forced sexual activity: Not on file  Other Topics Concern  . Not on file  Social History Narrative  . Not on file     Review of Systems  Constitutional: Positive for chills and fatigue. Negative for activity change, appetite change, diaphoresis, fever and unexpected weight change.  HENT: Negative.   Eyes: Negative.   Respiratory:  Positive for shortness of breath. Negative for apnea, cough, choking, chest tightness and wheezing.   Cardiovascular: Negative.   Gastrointestinal: Negative.  Negative for abdominal pain, constipation, diarrhea, nausea and vomiting.  Endocrine: Negative.   Genitourinary: Positive for frequency. Negative for decreased urine volume, difficulty urinating, dysuria, enuresis, flank pain, hematuria and pelvic pain.  Musculoskeletal: Negative.   Skin: Negative.   Allergic/Immunologic: Negative.   Neurological: Negative.  Negative for dizziness, tremors, syncope, weakness, light-headedness, numbness and headaches.  Hematological: Negative.   Psychiatric/Behavioral: Negative.   All other systems reviewed and are negative.      Objective:    Vitals:   12/15/18 1214  BP: 120/62  Pulse: 93  Resp: (!) 22  Temp: 98.2 F (36.8 C)  TempSrc: Oral  SpO2: 98%  Weight: 204 lb 4 oz (92.6 kg)  Height: 5\' 4"  (1.626 m)      Physical Exam Vitals signs and nursing note reviewed.  Constitutional:      General: She is not in acute distress.    Appearance: Normal appearance. She is well-developed. She is obese. She is not ill-appearing, toxic-appearing or diaphoretic.     Comments: Elderly female, non-toxic appearing.  Looks stated age, appears a little tired, but alert  HENT:     Head: Normocephalic and atraumatic.     Right Ear: External ear normal.     Left Ear: External ear normal.     Nose: Nose normal.     Mouth/Throat:     Mouth: Mucous membranes are moist.     Pharynx: Uvula midline. No posterior oropharyngeal erythema.  Eyes:     General: Lids are normal. No scleral icterus.    Conjunctiva/sclera: Conjunctivae normal.     Pupils: Pupils are equal, round, and reactive to light.  Neck:     Musculoskeletal: Normal range of motion and neck supple.     Trachea: Phonation normal. No tracheal deviation.  Cardiovascular:     Rate and Rhythm: Normal rate and regular rhythm.     Pulses:  Normal pulses.          Radial pulses are 2+ on the right side and 2+ on the left side.       Posterior tibial pulses are 2+ on the right side and 2+ on the left side.     Heart sounds: Normal heart sounds. No  murmur. No friction rub. No gallop.   Pulmonary:     Effort: Pulmonary effort is normal. Tachypnea (intermittent, RR ~16 - 20 while in room) present. No accessory muscle usage, prolonged expiration, respiratory distress or retractions.     Breath sounds: Normal breath sounds and air entry. No stridor, decreased air movement or transmitted upper airway sounds. No decreased breath sounds, wheezing, rhonchi or rales.  Chest:     Chest wall: No tenderness.  Abdominal:     General: Bowel sounds are normal. There is no distension.     Palpations: Abdomen is soft.     Tenderness: There is no abdominal tenderness. There is no right CVA tenderness, left CVA tenderness, guarding or rebound.  Musculoskeletal:        General: No deformity.  Lymphadenopathy:     Cervical: No cervical adenopathy.  Skin:    General: Skin is warm and dry.     Capillary Refill: Capillary refill takes less than 2 seconds.     Coloration: Skin is not pale.     Findings: No rash.  Neurological:     Mental Status: She is alert and oriented to person, place, and time.     Motor: No weakness or abnormal muscle tone.     Coordination: Coordination normal.     Gait: Gait normal.  Psychiatric:        Mood and Affect: Mood normal.        Speech: Speech normal.        Behavior: Behavior normal.           Assessment & Plan:      ICD-10-CM   1. Chills R68.83 Influenza A and B Ag, Immunoassay    Urinalysis, Routine w reflex microscopic    COMPLETE METABOLIC PANEL WITH GFR    CBC with Differential    DG Chest 2 View  2. Generalized muscle ache M79.10 Influenza A and B Ag, Immunoassay    Urinalysis, Routine w reflex microscopic    COMPLETE METABOLIC PANEL WITH GFR    CBC with Differential    DG Chest 2 View    3. SOB (shortness of breath) on exertion U98.11 COMPLETE METABOLIC PANEL WITH GFR    CBC with Differential    DG Chest 2 View    CANCELED: DG Chest 2 View    Flu negative.  Screening labs and UA - very vague hx, but pt doesn't feel well.   States she's breathing hard when at rest, RR elevated white sitting here ~ 22, but sometimes normalizes when she is talking and distracted, no distress, no retractions. She was able to ambulate with normal SpO2 96% and HR normal - see LPN documentation Lungs CTA, BP and other VS good, heart and lung exam unremarkable, pt appears euvolemic   UA + for leuks 2+, few bacteria and WBC 10-20 hpf I received results after pt had long left the clinic, unable to add urine culture.  Will tx and have pt follow up if still having urinary frequency.  She stated frequency was only from when she was drinking a lot.     CXR ordered and pending - exam unremarkable, respirations almost improve with exertion and ambulation around clinic, vague cough complaint, non-productive, no coughing while in room.  She did not endorse any associated cardiac sx with her breathing complaints, so I did not initiate any cardiac work-up at this time, with her other symptoms more suspicious that it may be a other viral illness (?).  I have seen the patient a few times in the last several months she is also been seen by Dr. Dennard Schaumann who did add on some additional blood pressure medications for several months of uncontrolled HTN.  He added on Cardura 2 mg and she is also on amlodipine 5 mg and hyzaar.    Per chart review she has not heart hx, no past echo, stress tests, I did an EKG a few months ago for HTN urgency.    If breathing sx continue, close follow up and likely initiate a cardiac workup with ECHO/holter.  Delsa Grana, PA-C 12/15/18 12:30 PM

## 2018-12-15 NOTE — Patient Instructions (Signed)
I suspect a viral illness and possibly some early bronchitis.   I will call you with lab and chest x-ray results.  If you have any worsening shortness of breath you need to go to the ER to get checked out.    If it doesn't worsen, but nothing improves, we may need to do some other tests to figure out the problem.  Follow up in office if not improving in 1-2 days.

## 2018-12-16 MED ORDER — ALBUTEROL SULFATE HFA 108 (90 BASE) MCG/ACT IN AERS: 2.0000 | INHALATION_SPRAY | RESPIRATORY_TRACT | 0 refills | Status: DC | PRN

## 2018-12-16 MED ORDER — PREDNISONE 20 MG PO TABS: ORAL_TABLET | ORAL | 0 refills | Status: DC

## 2018-12-16 MED ORDER — BENZONATATE 100 MG PO CAPS: 100.0000 mg | ORAL_CAPSULE | Freq: Three times a day (TID) | ORAL | 0 refills | Status: DC | PRN

## 2018-12-22 ENCOUNTER — Encounter: Payer: Self-pay | Admitting: Family Medicine

## 2018-12-22 ENCOUNTER — Ambulatory Visit (INDEPENDENT_AMBULATORY_CARE_PROVIDER_SITE_OTHER): Payer: Medicare HMO | Admitting: Family Medicine

## 2018-12-22 ENCOUNTER — Telehealth: Payer: Self-pay | Admitting: Family Medicine

## 2018-12-22 VITALS — BP 118/78 | HR 76 | Temp 97.6°F | Resp 15 | Ht 64.0 in | Wt 201.1 lb

## 2018-12-22 DIAGNOSIS — N179 Acute kidney failure, unspecified: Secondary | ICD-10-CM

## 2018-12-22 DIAGNOSIS — I10 Essential (primary) hypertension: Secondary | ICD-10-CM

## 2018-12-22 LAB — BASIC METABOLIC PANEL WITH GFR
BUN/Creatinine Ratio: 22 (calc) (ref 6–22)
BUN: 29 mg/dL — ABNORMAL HIGH (ref 7–25)
CO2: 25 mmol/L (ref 20–32)
CREATININE: 1.3 mg/dL — AB (ref 0.60–0.93)
Calcium: 10.1 mg/dL (ref 8.6–10.4)
Chloride: 101 mmol/L (ref 98–110)
GFR, Est African American: 46 mL/min/{1.73_m2} — ABNORMAL LOW (ref >=60)
GFR, Est Non African American: 40 mL/min/{1.73_m2} — ABNORMAL LOW (ref >=60)
Glucose, Bld: 185 mg/dL — ABNORMAL HIGH (ref 65–99)
Potassium: 4.7 mmol/L (ref 3.5–5.3)
SODIUM: 136 mmol/L (ref 135–146)

## 2018-12-22 NOTE — Progress Notes (Signed)
Patient ID: Carla Wells, female    DOB: [birth year], 78 y.o.   MRN: [id number]  PCP: Susy Frizzle, MD  Chief Complaint  Patient presents with  . Chills    In 1 week follow up. States she is back to baseline    Subjective:   12/22/18 Carla Wells is a 78 y.o. female here for recheck.  She presented last week with  vague sx, her urinalysis was suspicious for UTI, she is also treated for cough, she did complete Keflex antibiotics and took steroid burst and did over-the-counter medicines and she feels much better.  She no longer feels short of breath she denies any muscle aches fatigue, she has no urinary frequency urgency or dysuria.  She no longer having any chills, weakness or near syncopal episodes.  She did hold 1 of her blood pressure medicines but cannot recall which one.  Prior to last sick visit she came in with very elevated blood pressure and additional medicine was given to her she is not sure if she has held Cardura or Norvasc and she is going to go home and check and call me back.  She does not feel sluggish, near syncopal has no chest pain shortness of breath dizziness, lower extremity swelling, palpitations, orthopnea or PND. Her appetite is good, energy is good.  No fever.  Her labs from last week kidney function was worse than her baseline, mild leukocytosis. Serum creatinine on 12/15/2018 was 1.59 and GFR 36, her baseline serum creatinine is usually ~1.00 - 1.10 with GFR 55-60. Flu was negative last week, CXR neg for CAP, some bibasilar atelectasis - her breathing and HR improved today normal   12/15/2018 HPI: Carla Wells is a 78 y.o. female, presents to clinic with CC of several vague sx 1 week.  She complained to the nurse of cough, HA, hot/cold chills, body aches, to me she denies any URI sx, she denies cp, cough, wheeze, but she does state she feels "short winded" just sitting here, although no difficulty with exertion.  She also mentions urinary frequency  w/o abd pain, back pain, hematuria, dysuria.  She just feels generally unwell.  No known sick contacts.      Patient Active Problem List   Diagnosis Date Noted  . 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 Hypertension   . Hyperlipidemia      Prior to Admission medications   Medication Sig Start Date End Date Taking? Authorizing Provider  amLODipine (NORVASC) 5 MG tablet TAKE 1 TABLET BY MOUTH EVERY DAY 11/30/18  Yes Susy Frizzle, MD  aspirin 81 MG tablet Take 81 mg by mouth daily.   Yes [provider]  Calcium Citrate-Vitamin D (CALCIUM + D PO) Take 1 tablet by mouth daily.   Yes [provider]  doxazosin (CARDURA) 2 MG tablet TAKE 1 TABLET BY MOUTH EVERY DAY 10/16/18  Yes Susy Frizzle, MD  losartan-hydrochlorothiazide (HYZAAR) 50-12.5 MG tablet Take 1 tablet by mouth daily. 05/13/18  Yes Susy Frizzle, MD  meloxicam (MOBIC) 15 MG tablet TAKE 1 TABLET BY MOUTH EVERY DAY 11/16/18  Yes Susy Frizzle, MD  omeprazole (PRILOSEC) 40 MG capsule Take 1 capsule (40 mg total) by mouth daily. 05/13/18  Yes Susy Frizzle, MD  simvastatin (ZOCOR) 10 MG tablet Take 1 tablet (10 mg total) by mouth at bedtime. 04/24/18  Yes Susy Frizzle, MD  Vitamin D, Cholecalciferol, 1000 UNITS CAPS Take 1,000 mg by mouth.  Yes [provider]     Allergies  Allergen Reactions  . Penicillins      Family History  Problem Relation Age of Onset  . Stroke Mother   . Kidney failure Father   . Diabetes Brother   . Cancer Brother        Lung - Smoker  . Lung cancer Brother   . Cervical cancer Sister   . Cancer Sister   . Cancer Sister        ?Cervical CA     Social History   Socioeconomic History  . Marital status: Single    Spouse name: Not on file  . Number of children: Not on file  . Years of education: Not on file  . Highest education level: Not on file  Occupational History  . Not on file  Social Needs  . Financial resource strain: Not on  file  . Food insecurity:    Worry: Not on file    Inability: Not on file  . Transportation needs:    Medical: Not on file    Non-medical: Not on file  Tobacco Use  . Smoking status: Never Smoker  . Smokeless tobacco: Former Systems developer    Types: Snuff  Substance and Sexual Activity  . Alcohol use: No  . Drug use: No  . Sexual activity: Not on file  Lifestyle  . Physical activity:    Days per week: Not on file    Minutes per session: Not on file  . Stress: Not on file  Relationships  . Social connections:    Talks on phone: Not on file    Gets together: Not on file    Attends religious service: Not on file    Active member of club or organization: Not on file    Attends meetings of clubs or organizations: Not on file    Relationship status: Not on file  . Intimate partner violence:    Fear of current or ex partner: Not on file    Emotionally abused: Not on file    Physically abused: Not on file    Forced sexual activity: Not on file  Other Topics Concern  . Not on file  Social History Narrative  . Not on file     Review of Systems  Constitutional: Negative.   HENT: Negative.   Eyes: Negative.   Respiratory: Negative.   Cardiovascular: Negative.   Gastrointestinal: Negative.   Endocrine: Negative.   Genitourinary: Negative.   Musculoskeletal: Negative.   Skin: Negative.   Allergic/Immunologic: Negative.   Neurological: Negative.   Hematological: Negative.   Psychiatric/Behavioral: Negative.   All other systems reviewed and are negative.      Objective:    Vitals:   12/22/18 0929  BP: 118/78  Pulse: 76  Resp: 15  Temp: 97.6 F (36.4 C)  TempSrc: Oral  SpO2: 98%  Weight: 201 lb 2 oz (91.2 kg)  Height: 5\' 4"  (1.626 m)      Physical Exam Vitals signs and nursing note reviewed.  Constitutional:      General: She is not in acute distress.    Appearance: Normal appearance. She is well-developed. She is not ill-appearing, toxic-appearing or diaphoretic.       Comments: Elderly female, well appearing  HENT:     Head: Normocephalic and atraumatic.     Right Ear: Tympanic membrane, ear canal and external ear normal. There is no impacted cerumen.     Left Ear: Tympanic membrane, ear canal  and external ear normal. There is no impacted cerumen.     Nose: Nose normal. No congestion or rhinorrhea.     Right Turbinates: Enlarged.     Left Turbinates: Enlarged.     Mouth/Throat:     Mouth: Mucous membranes are moist.     Pharynx: Oropharynx is clear. Uvula midline. No posterior oropharyngeal erythema.  Eyes:     General: Lids are normal. No scleral icterus.    Conjunctiva/sclera: Conjunctivae normal.     Pupils: Pupils are equal, round, and reactive to light.  Neck:     Musculoskeletal: Normal range of motion and neck supple.     Trachea: Phonation normal. No tracheal deviation.  Cardiovascular:     Rate and Rhythm: Normal rate and regular rhythm.     Pulses: Normal pulses.          Radial pulses are 2+ on the right side and 2+ on the left side.       Posterior tibial pulses are 2+ on the right side and 2+ on the left side.     Heart sounds: Normal heart sounds. No murmur. No friction rub. No gallop.   Pulmonary:     Effort: Pulmonary effort is normal. No respiratory distress.     Breath sounds: Normal breath sounds. No stridor. No wheezing, rhonchi or rales.  Chest:     Chest wall: No tenderness.  Abdominal:     General: Bowel sounds are normal. There is no distension.     Palpations: Abdomen is soft.     Tenderness: There is no abdominal tenderness. There is no right CVA tenderness, left CVA tenderness, guarding or rebound.  Musculoskeletal: Normal range of motion.        General: No deformity.  Lymphadenopathy:     Cervical: No cervical adenopathy.  Skin:    General: Skin is warm and dry.     Capillary Refill: Capillary refill takes less than 2 seconds.     Coloration: Skin is not pale.     Findings: No rash.  Neurological:      Mental Status: She is alert and oriented to person, place, and time.     Motor: No abnormal muscle tone.     Gait: Gait normal.  Psychiatric:        Speech: Speech normal.        Behavior: Behavior normal.           Assessment & Plan:   Recheck after illness, UTI - improved, completed abx, no SE, no sx at all right now. Vague breathing sx from last week, CXR showed atelectasis, she was breathing rapidly and shallow, today VSS, no tachypnea, no complaints of any cardiac or respiratory sx, improved  Recheck renal function with AKI last week Pt to call with clarification of BP meds -  BP today 118/78 - if she is only taking 2 BP meds, do not believe she needs to take 3 - will have to continue to monitor.    ICD-10-CM   1. AKI (acute kidney injury) ([place]) V95.6 BASIC METABOLIC PANEL WITH GFR  2. Essential hypertension L87 BASIC METABOLIC PANEL WITH GFR      Delsa Grana, PA-C 12/22/18 9:29 AM

## 2018-12-22 NOTE — Telephone Encounter (Signed)
Patient called stating that she is taking the amlodipine 5 mg and Cardura 2 mg. I asked patient if she is still taking the Losartan-HCTZ she stated that she does not believe so. She just went home and found medications that were circled on the list from this mornings after visit summary.

## 2018-12-22 NOTE — Telephone Encounter (Signed)
Want her to be taking hyzaar and norvasc for now, hold cardura, monitor BP, and follow up in 1-2 weeks, bring meds - can be nurse visit

## 2018-12-22 NOTE — Telephone Encounter (Signed)
Spoke with patient and informed her to discontinue taking the cardura, and continue taking Hyzaar and Amlodopine. Patient verbalized understanding scheduled for a nurse med check and a blood pressure check on 12/29/2018

## 2018-12-29 ENCOUNTER — Ambulatory Visit: Payer: Medicare HMO | Admitting: Family Medicine

## 2018-12-29 VITALS — BP 132/65 | HR 84

## 2018-12-29 DIAGNOSIS — Z0130 Encounter for examination of blood pressure without abnormal findings: Secondary | ICD-10-CM

## 2018-12-29 NOTE — Progress Notes (Signed)
Pt came in today for BP check after stopping the Doxazosin and for demonstration of new BP monitor she received. Med list updated.

## 2019-01-19 ENCOUNTER — Ambulatory Visit (INDEPENDENT_AMBULATORY_CARE_PROVIDER_SITE_OTHER): Payer: Medicare HMO | Admitting: Family Medicine

## 2019-01-19 ENCOUNTER — Encounter: Payer: Self-pay | Admitting: Family Medicine

## 2019-01-19 VITALS — BP 146/68 | HR 88 | Temp 97.8°F | Resp 16 | Ht 64.0 in | Wt 201.0 lb

## 2019-01-19 DIAGNOSIS — N179 Acute kidney failure, unspecified: Secondary | ICD-10-CM | POA: Diagnosis not present

## 2019-01-19 NOTE — Progress Notes (Signed)
Subjective:    Patient ID: Carla Wells, female    DOB: 04-Oct-1941, 78 y.o.   MRN: [id number]  HPI  Patient was recently seen by my partner for an illness.  During the work-up for that illness, her creatinine was found to be elevated near 1.6.  Her baseline is near 1.12.  Repeat creatinine showed her elevation was persistent 1.3.  She is referred to me today to follow this.  Reviewing her medicine list, the patient is taking meloxicam every day for shoulder pain and joint pain.  She is also on amlodipine 5 mg a day and Hyzaar for hypertension.  She denies any hematuria or dysuria.  She denies any low back pain.  She denies any fevers or chills.  Overall she is feeling well.  She denies dehydration.  She denies diarrhea or other sources of dehydration Past Medical History:  Diagnosis Date  . Abnormal pap   . Cataract   . Colon polyps   . Hyperlipidemia   . Hypertension   . Murmur    No past surgical history on file. Current Outpatient Medications on File Prior to Visit  Medication Sig Dispense Refill  . albuterol (PROVENTIL HFA;VENTOLIN HFA) 108 (90 Base) MCG/ACT inhaler Inhale 2 puffs into the lungs every 4 (four) hours as needed for wheezing or shortness of breath. 1 Inhaler 0  . amLODipine (NORVASC) 5 MG tablet TAKE 1 TABLET BY MOUTH EVERY DAY 90 tablet 3  . aspirin 81 MG tablet Take 81 mg by mouth daily.    . Calcium Citrate-Vitamin D (CALCIUM + D PO) Take 1 tablet by mouth daily.    Marland Kitchen latanoprost (XALATAN) 0.005 % ophthalmic solution     . losartan-hydrochlorothiazide (HYZAAR) 50-12.5 MG tablet Take 1 tablet by mouth daily. 90 tablet 3  . meloxicam (MOBIC) 15 MG tablet TAKE 1 TABLET BY MOUTH EVERY DAY 90 tablet 1  . omeprazole (PRILOSEC) 40 MG capsule Take 1 capsule (40 mg total) by mouth daily. 90 capsule 3  . simvastatin (ZOCOR) 10 MG tablet Take 1 tablet (10 mg total) by mouth at bedtime. 90 tablet 3  . Vitamin D, Cholecalciferol, 1000 UNITS CAPS Take 1,000 mg by mouth.      No current facility-administered medications on file prior to visit.    Allergies  Allergen Reactions  . Penicillins    Social History   Socioeconomic History  . Marital status: Single    Spouse name: Not on file  . Number of children: Not on file  . Years of education: Not on file  . Highest education level: Not on file  Occupational History  . Not on file  Social Needs  . Financial resource strain: Not on file  . Food insecurity:    Worry: Not on file    Inability: Not on file  . Transportation needs:    Medical: Not on file    Non-medical: Not on file  Tobacco Use  . Smoking status: Never Smoker  . Smokeless tobacco: Former Systems developer    Types: Snuff  Substance and Sexual Activity  . Alcohol use: No  . Drug use: No  . Sexual activity: Not on file  Lifestyle  . Physical activity:    Days per week: Not on file    Minutes per session: Not on file  . Stress: Not on file  Relationships  . Social connections:    Talks on phone: Not on file    Gets together: Not on file  Attends religious service: Not on file    Active member of club or organization: Not on file    Attends meetings of clubs or organizations: Not on file    Relationship status: Not on file  . Intimate partner violence:    Fear of current or ex partner: Not on file    Emotionally abused: Not on file    Physically abused: Not on file    Forced sexual activity: Not on file  Other Topics Concern  . Not on file  Social History Narrative  . Not on file     Review of Systems  All other systems reviewed and are negative.      Objective:   Physical Exam  Constitutional: She appears well-developed and well-nourished.  Cardiovascular: Normal rate and normal heart sounds. Exam reveals no gallop.  No murmur heard. Pulmonary/Chest: Effort normal and breath sounds normal. No respiratory distress. She has no wheezes. She has no rales. She exhibits no tenderness.  Abdominal: Soft. Bowel sounds are  normal. She exhibits no distension and no mass. There is no abdominal tenderness. There is no rebound and no guarding.  Vitals reviewed.         Assessment & Plan:  AKI (acute kidney injury) ([place]) - Plan: BASIC METABOLIC PANEL WITH GFR, Urinalysis, Routine w reflex microscopic  I suspect this is due to a combination of hypertension coupled with NSAID overuse.  I recommended discontinuation of meloxicam.  I recommended the patient drink plenty of fluids/water.  I recommended that she increase amlodipine from 5 mg to 10 mg a day and then recheck next week to repeat a BMP to monitor her kidney function and also recheck her blood pressure.  If creatinine is improving at that time no further work-up is necessary.  If creatinine is persistently elevated, I would obtain a renal ultrasound.  While the patient is here today I will also check a urinalysis for any casts, hematuria, etc.

## 2019-01-20 LAB — URINALYSIS, ROUTINE W REFLEX MICROSCOPIC
Bilirubin Urine: NEGATIVE
Glucose, UA: NEGATIVE
Hgb urine dipstick: NEGATIVE
Hyaline Cast: NONE SEEN /LPF
Ketones, ur: NEGATIVE
Nitrite: NEGATIVE
PROTEIN: NEGATIVE
Specific Gravity, Urine: 1.03 (ref 1.001–1.03)
pH: 5.5 (ref 5.0–8.0)

## 2019-01-20 LAB — BASIC METABOLIC PANEL WITH GFR
BUN/Creatinine Ratio: 22 (calc) (ref 6–22)
BUN: 27 mg/dL — ABNORMAL HIGH (ref 7–25)
CO2: 27 mmol/L (ref 20–32)
Calcium: 10 mg/dL (ref 8.6–10.4)
Chloride: 102 mmol/L (ref 98–110)
Creat: 1.25 mg/dL — ABNORMAL HIGH (ref 0.60–0.93)
GFR, EST NON AFRICAN AMERICAN: 41 mL/min/{1.73_m2} — AB (ref >=60)
GFR, Est African American: 48 mL/min/{1.73_m2} — ABNORMAL LOW (ref >=60)
Glucose, Bld: 124 mg/dL — ABNORMAL HIGH (ref 65–99)
POTASSIUM: 3.9 mmol/L (ref 3.5–5.3)
SODIUM: 138 mmol/L (ref 135–146)

## 2019-01-21 ENCOUNTER — Other Ambulatory Visit: Payer: Self-pay | Admitting: Family Medicine

## 2019-01-21 DIAGNOSIS — R944 Abnormal results of kidney function studies: Secondary | ICD-10-CM

## 2019-01-21 DIAGNOSIS — N390 Urinary tract infection, site not specified: Secondary | ICD-10-CM

## 2019-01-21 MED ORDER — SULFAMETHOXAZOLE-TRIMETHOPRIM 800-160 MG PO TABS: 1.0000 | ORAL_TABLET | Freq: Two times a day (BID) | ORAL | 0 refills | Status: AC

## 2019-01-28 ENCOUNTER — Ambulatory Visit: Payer: Medicare HMO | Admitting: *Deleted

## 2019-01-28 ENCOUNTER — Other Ambulatory Visit: Payer: Medicare HMO

## 2019-01-28 VITALS — BP 132/68

## 2019-01-28 DIAGNOSIS — N390 Urinary tract infection, site not specified: Secondary | ICD-10-CM

## 2019-01-28 DIAGNOSIS — I10 Essential (primary) hypertension: Secondary | ICD-10-CM

## 2019-01-28 DIAGNOSIS — Z0130 Encounter for examination of blood pressure without abnormal findings: Secondary | ICD-10-CM

## 2019-01-28 LAB — URINALYSIS, ROUTINE W REFLEX MICROSCOPIC
Bilirubin Urine: NEGATIVE
Glucose, UA: NEGATIVE
Hgb urine dipstick: NEGATIVE
KETONES UR: NEGATIVE
NITRITE: NEGATIVE
PROTEIN: NEGATIVE
RBC / HPF: NONE SEEN /HPF (ref 0–2)
Specific Gravity, Urine: 1.02 (ref 1.001–1.03)
pH: 6.5 (ref 5.0–8.0)

## 2019-01-28 LAB — MICROSCOPIC MESSAGE

## 2019-02-02 ENCOUNTER — Other Ambulatory Visit: Payer: Self-pay | Admitting: Family Medicine

## 2019-02-02 DIAGNOSIS — R350 Frequency of micturition: Secondary | ICD-10-CM

## 2019-02-02 DIAGNOSIS — N390 Urinary tract infection, site not specified: Secondary | ICD-10-CM

## 2019-02-03 ENCOUNTER — Other Ambulatory Visit: Payer: Medicare HMO

## 2019-02-03 DIAGNOSIS — N390 Urinary tract infection, site not specified: Secondary | ICD-10-CM

## 2019-02-03 DIAGNOSIS — R944 Abnormal results of kidney function studies: Secondary | ICD-10-CM | POA: Diagnosis not present

## 2019-02-04 LAB — URINE CULTURE
MICRO NUMBER:: 275720
SPECIMEN QUALITY:: ADEQUATE

## 2019-02-04 LAB — BASIC METABOLIC PANEL
BUN/Creatinine Ratio: 15 (calc) (ref 6–22)
BUN: 19 mg/dL (ref 7–25)
CO2: 25 mmol/L (ref 20–32)
Calcium: 9.7 mg/dL (ref 8.6–10.4)
Chloride: 102 mmol/L (ref 98–110)
Creat: 1.23 mg/dL — ABNORMAL HIGH (ref 0.60–0.93)
Glucose, Bld: 199 mg/dL — ABNORMAL HIGH (ref 65–99)
Potassium: 4 mmol/L (ref 3.5–5.3)
Sodium: 138 mmol/L (ref 135–146)

## 2019-02-14 ENCOUNTER — Other Ambulatory Visit: Payer: Self-pay | Admitting: Family Medicine

## 2019-02-14 DIAGNOSIS — R0602 Shortness of breath: Secondary | ICD-10-CM

## 2019-02-17 ENCOUNTER — Other Ambulatory Visit: Payer: Self-pay | Admitting: Family Medicine

## 2019-02-18 ENCOUNTER — Other Ambulatory Visit: Payer: Self-pay

## 2019-02-18 ENCOUNTER — Encounter: Payer: Self-pay | Admitting: Family Medicine

## 2019-02-18 ENCOUNTER — Ambulatory Visit (INDEPENDENT_AMBULATORY_CARE_PROVIDER_SITE_OTHER): Payer: Medicare HMO | Admitting: Family Medicine

## 2019-02-18 VITALS — BP 110/60 | HR 94 | Temp 98.2°F | Resp 16 | Ht 64.0 in | Wt 198.0 lb

## 2019-02-18 DIAGNOSIS — N183 Chronic kidney disease, stage 3 unspecified: Secondary | ICD-10-CM

## 2019-02-18 NOTE — Progress Notes (Signed)
Subjective:    Patient ID: Carla Wells, female    DOB: 10/18/1941, 78 y.o.   MRN: [id number]  HPI  Patient is here today to follow-up her chronic kidney disease.  Was initially seen in early January for an isolated problem and her creatinine was found to have risen from 1.12-1.59.  She was taking meloxicam at that time.  After discontinuation of the meloxicam, her creatinine trended down to 1.3.  On her most recent lab work it is down to 1.23.  It is still not dropped her baseline from last year.  Patient has had several urinalysis which have shown white blood cells.  However on her most recent urine sample in March, her urine culture was negative and showed contamination.  There is no evidence of glomerulonephritis based on her urinalysis. Past Medical History:  Diagnosis Date  . Abnormal pap   . Cataract   . Colon polyps   . Hyperlipidemia   . Hypertension   . Murmur    No past surgical history on file. Current Outpatient Medications on File Prior to Visit  Medication Sig Dispense Refill  . albuterol (PROVENTIL HFA;VENTOLIN HFA) 108 (90 Base) MCG/ACT inhaler INHALE 2 PUFFS INTO THE LUNGS EVERY 4 (FOUR) HOURS AS NEEDED FOR WHEEZING OR SHORTNESS OF BREATH. 6.7 Inhaler 5  . amLODipine (NORVASC) 5 MG tablet TAKE 1 TABLET BY MOUTH EVERY DAY 90 tablet 3  . aspirin 81 MG tablet Take 81 mg by mouth daily.    . Calcium Citrate-Vitamin D (CALCIUM + D PO) Take 1 tablet by mouth daily.    Marland Kitchen latanoprost (XALATAN) 0.005 % ophthalmic solution     . losartan-hydrochlorothiazide (HYZAAR) 50-12.5 MG tablet Take 1 tablet by mouth daily. 90 tablet 3  . omeprazole (PRILOSEC) 40 MG capsule Take 1 capsule (40 mg total) by mouth daily. 90 capsule 3  . simvastatin (ZOCOR) 10 MG tablet TAKE 1 TABLET AT BEDTIME 90 tablet 1  . Vitamin D, Cholecalciferol, 1000 UNITS CAPS Take 1,000 mg by mouth.     No current facility-administered medications on file prior to visit.    Allergies  Allergen Reactions  .  Penicillins    Social History   Socioeconomic History  . Marital status: Single    Spouse name: Not on file  . Number of children: Not on file  . Years of education: Not on file  . Highest education level: Not on file  Occupational History  . Not on file  Social Needs  . Financial resource strain: Not on file  . Food insecurity:    Worry: Not on file    Inability: Not on file  . Transportation needs:    Medical: Not on file    Non-medical: Not on file  Tobacco Use  . Smoking status: Never Smoker  . Smokeless tobacco: Former Systems developer    Types: Snuff  Substance and Sexual Activity  . Alcohol use: No  . Drug use: No  . Sexual activity: Not on file  Lifestyle  . Physical activity:    Days per week: Not on file    Minutes per session: Not on file  . Stress: Not on file  Relationships  . Social connections:    Talks on phone: Not on file    Gets together: Not on file    Attends religious service: Not on file    Active member of club or organization: Not on file    Attends meetings of clubs or organizations: Not on file  Relationship status: Not on file  . Intimate partner violence:    Fear of current or ex partner: Not on file    Emotionally abused: Not on file    Physically abused: Not on file    Forced sexual activity: Not on file  Other Topics Concern  . Not on file  Social History Narrative  . Not on file     Review of Systems  All other systems reviewed and are negative.      Objective:   Physical Exam  Constitutional: She appears well-developed and well-nourished.  Cardiovascular: Normal rate and normal heart sounds. Exam reveals no gallop.  No murmur heard. Pulmonary/Chest: Effort normal and breath sounds normal. No respiratory distress. She has no wheezes. She has no rales. She exhibits no tenderness.  Abdominal: Soft. Bowel sounds are normal. She exhibits no distension and no mass. There is no abdominal tenderness. There is no rebound and no guarding.   Vitals reviewed.         Assessment & Plan:  CKD (chronic kidney disease) stage 3, GFR 30-59 ml/min ([facility])  I believe her chronic kidney disease is due to age, longstanding high blood pressure, and NSAID overuse.  I recommended avoiding all NSAIDs.  She can use Tylenol for body aches or fever.  I recommended drinking plenty of water every day to avoid dehydration and I recommended tight blood pressure control.  Monitor kidney function every 3 to 4 months.

## 2019-04-20 ENCOUNTER — Other Ambulatory Visit: Payer: Self-pay | Admitting: Family Medicine

## 2019-05-10 ENCOUNTER — Other Ambulatory Visit: Payer: Self-pay | Admitting: Family Medicine

## 2019-05-10 DIAGNOSIS — M75102 Unspecified rotator cuff tear or rupture of left shoulder, not specified as traumatic: Secondary | ICD-10-CM

## 2019-05-25 DIAGNOSIS — Z1231 Encounter for screening mammogram for malignant neoplasm of breast: Secondary | ICD-10-CM | POA: Diagnosis not present

## 2019-05-25 DIAGNOSIS — K219 Gastro-esophageal reflux disease without esophagitis: Secondary | ICD-10-CM | POA: Diagnosis not present

## 2019-05-25 DIAGNOSIS — M85851 Other specified disorders of bone density and structure, right thigh: Secondary | ICD-10-CM | POA: Diagnosis not present

## 2019-05-25 LAB — HM DEXA SCAN

## 2019-05-25 LAB — HM MAMMOGRAPHY

## 2019-06-07 ENCOUNTER — Encounter: Payer: Self-pay | Admitting: *Deleted

## 2019-06-12 IMAGING — MR MR SHOULDER*L* W/O CM
5 series · 40 of 40 positions shown · non-contrast
Comparison: Limited correlation made with chest CT 12/13/2010.

CLINICAL DATA: Chronic shoulder pain with popping and limited range
of motion for years. No acute injury or prior relevant surgery.

EXAM:
MRI OF THE LEFT SHOULDER WITHOUT CONTRAST
TECHNIQUE: Multiplanar, multisequence MR imaging of the shoulder was performed.
No intravenous contrast was administered.

[Series 4: T2 fat-sat · axial · 4.0mm · 0.55mm/px · z∈[-47,+39]mm · 10 of 20 slices shown (1 of 3)]
[im 1/20]
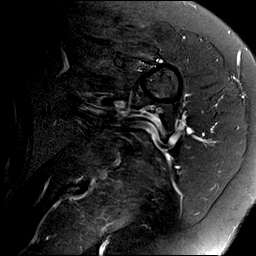
[im 3/20]
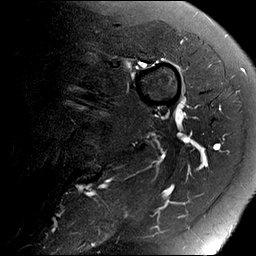
[im 5/20]
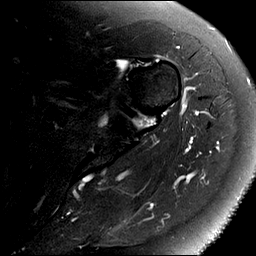
[im 7/20]
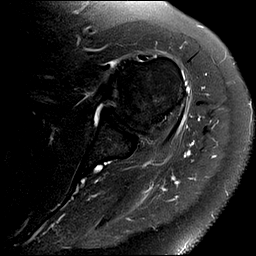
[im 9/20]
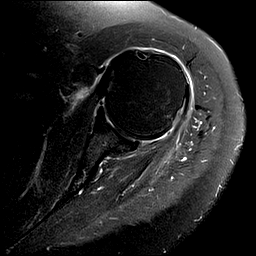
[im 11/20]
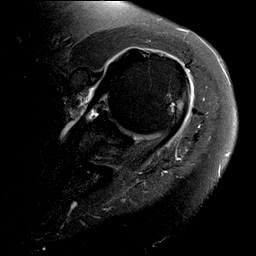
[im 13/20]
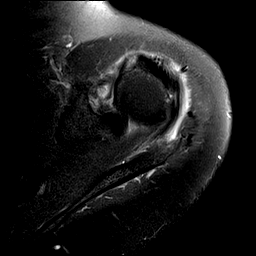
[im 15/20]
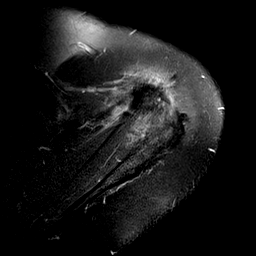
[im 17/20]
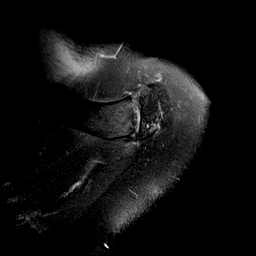
[im 20/20]
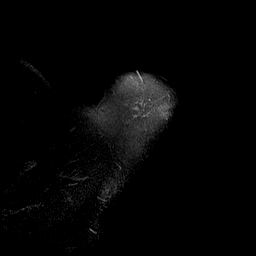

[Series 5: T2 fat-sat · oblique · 4.0mm · 0.59mm/px · 7 of 16 slices shown (2 of 3)]
[im 1/16]
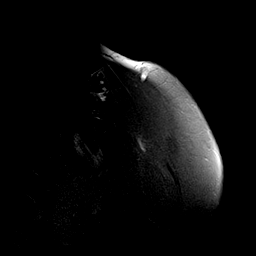
[im 3/16]
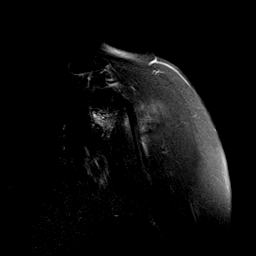
[im 6/16]
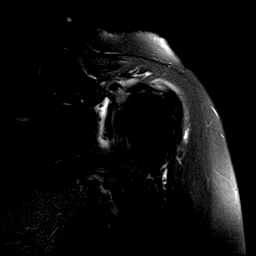
[im 8/16]
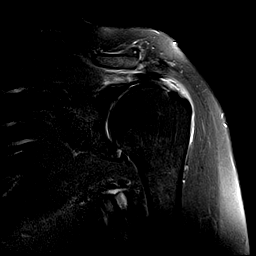
[im 11/16]
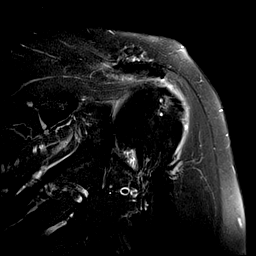
[im 13/16]
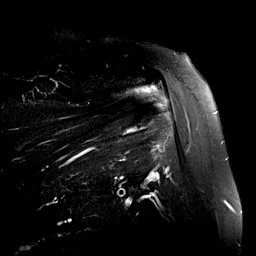
[im 16/16]
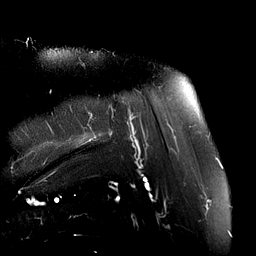

[Series 6: PD · oblique · 4.0mm · 0.59mm/px · 7 of 16 slices shown]
[im 1/16]
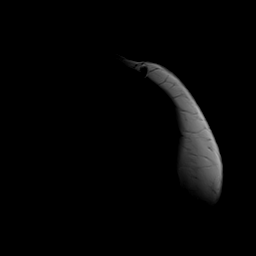
[im 3/16]
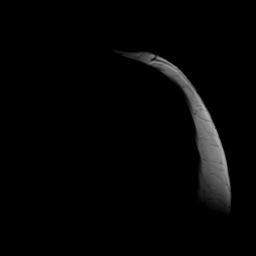
[im 6/16]
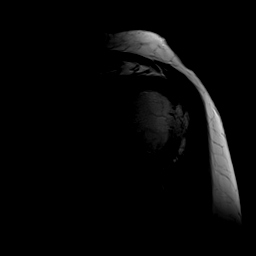
[im 8/16]
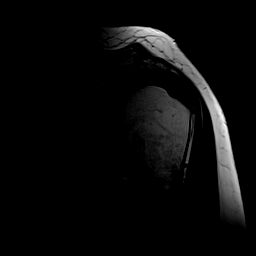
[im 11/16]
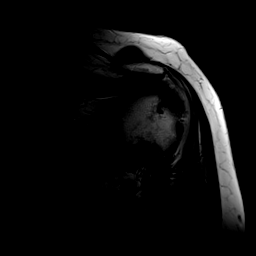
[im 13/16]
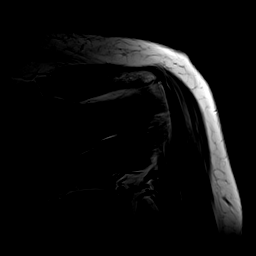
[im 16/16]
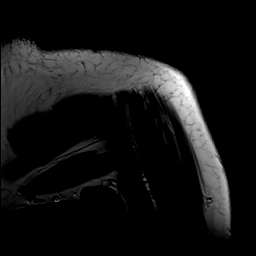

[Series 7: T2 fat-sat · oblique · 4.0mm · 0.59mm/px · 8 of 18 slices shown (3 of 3)]
[im 1/18]
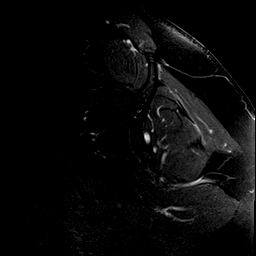
[im 3/18]
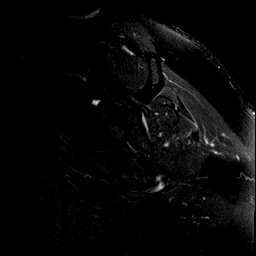
[im 5/18]
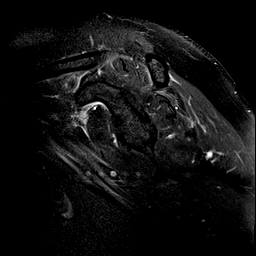
[im 8/18]
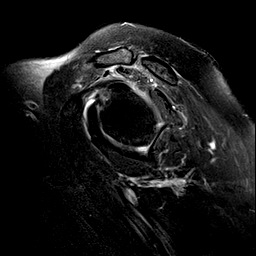
[im 10/18]
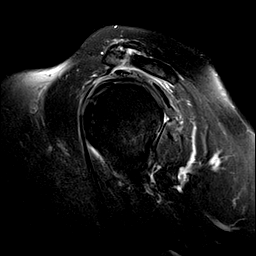
[im 13/18]
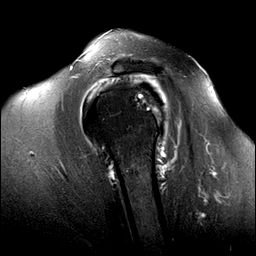
[im 15/18]
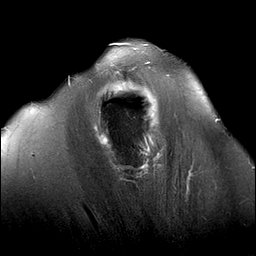
[im 18/18]
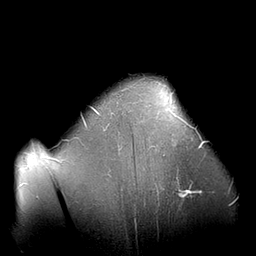

[Series 8: T1 · oblique · 4.0mm · 0.59mm/px · 8 of 18 slices shown]
[im 1/18]
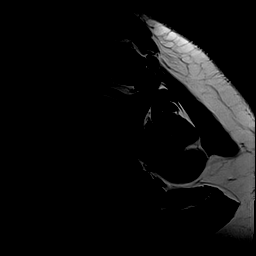
[im 3/18]
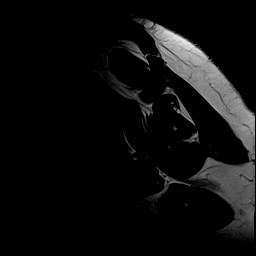
[im 5/18]
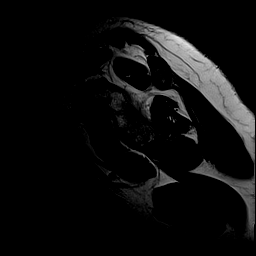
[im 8/18]
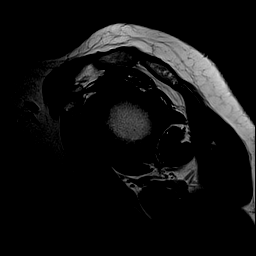
[im 10/18]
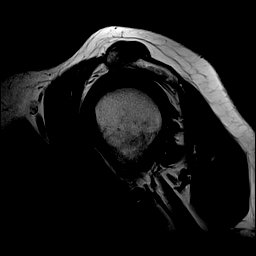
[im 13/18]
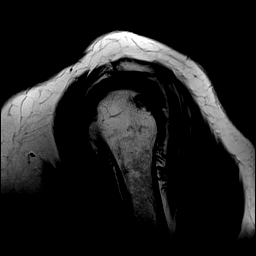
[im 15/18]
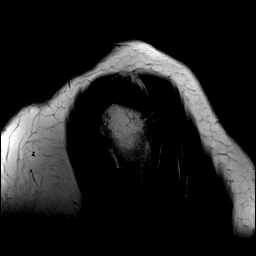
[im 18/18]
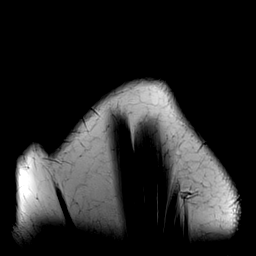

[40 of 40 positions shown; findings below may reference images not displayed]

FINDINGS: Rotator cuff: There is supraspinatus tendinosis with partial bursal
surface tearing distally, best seen on the coronal images. No
full-thickness tendon tear or retraction demonstrated. There is mild
infraspinatus tendinosis. The subscapularis and teres minor tendons
appear normal.

Muscles:  No focal muscular atrophy or edema.

Biceps long head:  Intact and normally positioned.

Acromioclavicular Joint: The acromion is type 2. There are mild to
moderate acromioclavicular degenerative changes with a small to
moderate amount of fluid in the subacromial-subdeltoid bursa.

Glenohumeral Joint: Mild glenohumeral degenerative changes. No
significant shoulder joint effusion.

Labrum: Labral assessment is limited by the lack of joint fluid. No
evidence of labral tear or paralabral cyst.

Bones: No acute or significant extra-articular osseous findings.

Other: No significant soft tissue findings.
IMPRESSION: 1. Supraspinatus tendinosis with partial bursal surface tearing and
adjacent subacromial-subdeltoid bursitis. No evidence of
full-thickness rotator cuff tear.
2. Mild infraspinatus tendinosis.
3. The labrum and biceps tendon appear intact.
4. Mild-to-moderate acromioclavicular degenerative changes.

## 2019-07-08 DIAGNOSIS — H35363 Drusen (degenerative) of macula, bilateral: Secondary | ICD-10-CM | POA: Diagnosis not present

## 2019-07-08 DIAGNOSIS — Z961 Presence of intraocular lens: Secondary | ICD-10-CM | POA: Diagnosis not present

## 2019-07-08 DIAGNOSIS — H401131 Primary open-angle glaucoma, bilateral, mild stage: Secondary | ICD-10-CM | POA: Diagnosis not present

## 2019-07-08 DIAGNOSIS — H35033 Hypertensive retinopathy, bilateral: Secondary | ICD-10-CM | POA: Diagnosis not present

## 2019-08-27 ENCOUNTER — Other Ambulatory Visit: Payer: Self-pay

## 2019-08-27 ENCOUNTER — Encounter: Payer: Self-pay | Admitting: Family Medicine

## 2019-08-27 ENCOUNTER — Ambulatory Visit (INDEPENDENT_AMBULATORY_CARE_PROVIDER_SITE_OTHER): Payer: Medicare HMO | Admitting: Family Medicine

## 2019-08-27 VITALS — BP 160/70 | HR 82 | Temp 98.1°F | Resp 18 | Ht 64.0 in | Wt 205.0 lb

## 2019-08-27 DIAGNOSIS — Z23 Encounter for immunization: Secondary | ICD-10-CM

## 2019-08-27 DIAGNOSIS — N183 Chronic kidney disease, stage 3 unspecified: Secondary | ICD-10-CM

## 2019-08-27 DIAGNOSIS — I10 Essential (primary) hypertension: Secondary | ICD-10-CM

## 2019-08-27 DIAGNOSIS — E7800 Pure hypercholesterolemia, unspecified: Secondary | ICD-10-CM | POA: Diagnosis not present

## 2019-08-27 DIAGNOSIS — R0602 Shortness of breath: Secondary | ICD-10-CM | POA: Diagnosis not present

## 2019-08-27 MED ORDER — AMLODIPINE BESYLATE 10 MG PO TABS: 10.0000 mg | ORAL_TABLET | Freq: Every day | ORAL | 3 refills | Status: DC

## 2019-08-27 NOTE — Progress Notes (Signed)
Subjective:    Patient ID: Carla Wells, female    DOB: 07-11-1941, 78 y.o.   MRN: [id number]  Medication Refill   02/18/19 Patient is here today to follow-up her chronic kidney disease.  Was initially seen in early January for an isolated problem and her creatinine was found to have risen from 1.12-1.59.  She was taking meloxicam at that time.  After discontinuation of the meloxicam, her creatinine trended down to 1.3.  On her most recent lab work it is down to 1.23.  It is still not dropped her baseline from last year.  Patient has had several urinalysis which have shown white blood cells.  However on her most recent urine sample in March, her urine culture was negative and showed contamination.  There is no evidence of glomerulonephritis based on her urinalysis.  At that time, my plan was: I believe her chronic kidney disease is due to age, longstanding high blood pressure, and NSAID overuse.  I recommended avoiding all NSAIDs.  She can use Tylenol for body aches or fever.  I recommended drinking plenty of water every day to avoid dehydration and I recommended tight blood pressure control.  Monitor kidney function every 3 to 4 months.  08/27/19 Patient is here today for follow-up.  Her blood pressure today is elevated at 160/70.  I rechecked it and found to be 144/70 but still elevated.  The patient has been periodically checking her blood pressure at home and been finding it between 140 and 160 herself.  She denies any chest pain but she does report dyspnea on exertion.  She states that over the last 2 weeks especially she finds her self more easily winded with minimal activity.  She denies any angina.  She denies any orthopnea.  She denies any paroxysmal nocturnal dyspnea.  She denies any leg swelling or pitting edema. Past Medical History:  Diagnosis Date  . Abnormal pap   . Cataract   . Colon polyps   . Hyperlipidemia   . Hypertension   . Murmur    No past surgical history on file.  Current Outpatient Medications on File Prior to Visit  Medication Sig Dispense Refill  . albuterol (PROVENTIL HFA;VENTOLIN HFA) 108 (90 Base) MCG/ACT inhaler INHALE 2 PUFFS INTO THE LUNGS EVERY 4 (FOUR) HOURS AS NEEDED FOR WHEEZING OR SHORTNESS OF BREATH. 6.7 Inhaler 5  . amLODipine (NORVASC) 5 MG tablet TAKE 1 TABLET BY MOUTH EVERY DAY 90 tablet 3  . aspirin 81 MG tablet Take 81 mg by mouth daily.    . Calcium Citrate-Vitamin D (CALCIUM + D PO) Take 1 tablet by mouth daily.    Marland Kitchen latanoprost (XALATAN) 0.005 % ophthalmic solution     . losartan-hydrochlorothiazide (HYZAAR) 50-12.5 MG tablet TAKE 1 TABLET EVERY DAY 90 tablet 3  . meloxicam (MOBIC) 15 MG tablet TAKE 1 TABLET BY MOUTH EVERY DAY 90 tablet 1  . omeprazole (PRILOSEC) 40 MG capsule TAKE 1 CAPSULE (40 MG TOTAL) BY MOUTH DAILY. 90 capsule 3  . simvastatin (ZOCOR) 10 MG tablet TAKE 1 TABLET AT BEDTIME 90 tablet 1  . Vitamin D, Cholecalciferol, 1000 UNITS CAPS Take 1,000 mg by mouth.     No current facility-administered medications on file prior to visit.    Allergies  Allergen Reactions  . Penicillins    Social History   Socioeconomic History  . Marital status: Single    Spouse name: Not on file  . Number of children: Not on file  . Years  of education: Not on file  . Highest education level: Not on file  Occupational History  . Not on file  Social Needs  . Financial resource strain: Not on file  . Food insecurity    Worry: Not on file    Inability: Not on file  . Transportation needs    Medical: Not on file    Non-medical: Not on file  Tobacco Use  . Smoking status: Never Smoker  . Smokeless tobacco: Former Systems developer    Types: Snuff  Substance and Sexual Activity  . Alcohol use: No  . Drug use: No  . Sexual activity: Not on file  Lifestyle  . Physical activity    Days per week: Not on file    Minutes per session: Not on file  . Stress: Not on file  Relationships  . Social Herbalist on phone: Not on  file    Gets together: Not on file    Attends religious service: Not on file    Active member of club or organization: Not on file    Attends meetings of clubs or organizations: Not on file    Relationship status: Not on file  . Intimate partner violence    Fear of current or ex partner: Not on file    Emotionally abused: Not on file    Physically abused: Not on file    Forced sexual activity: Not on file  Other Topics Concern  . Not on file  Social History Narrative  . Not on file     Review of Systems  All other systems reviewed and are negative.      Objective:   Physical Exam  Constitutional: She is oriented to person, place, and time. She appears well-developed and well-nourished.  Neck: No thyromegaly present.  Cardiovascular: Normal rate and normal heart sounds. Exam reveals no gallop.  No murmur heard. Pulmonary/Chest: Effort normal and breath sounds normal. No respiratory distress. She has no wheezes. She has no rales. She exhibits no tenderness.  Abdominal: Soft. Bowel sounds are normal. She exhibits no distension and no mass. There is no abdominal tenderness. There is no rebound and no guarding.  Musculoskeletal:        General: No edema.  Neurological: She is alert and oriented to person, place, and time. No cranial nerve deficit. She exhibits normal muscle tone. Coordination normal.  Skin: She is not diaphoretic.  Vitals reviewed.         Assessment & Plan:  CKD (chronic kidney disease) stage 3, GFR 30-59 ml/min (HCC) - Plan: CBC with Differential/Platelet, COMPLETE METABOLIC PANEL WITH GFR, Lipid panel  Essential hypertension - Plan: CBC with Differential/Platelet, COMPLETE METABOLIC PANEL WITH GFR, Lipid panel  Pure hypercholesterolemia - Plan: CBC with Differential/Platelet, COMPLETE METABOLIC PANEL WITH GFR, Lipid panel  Shortness of breath - Plan: ECHOCARDIOGRAM COMPLETE  Need for immunization against influenza - Plan: Flu Vaccine QUAD High  Dose(Fluad)  Given her chronic kidney disease, I will recheck a CMP to monitor her renal function.  Patient is avoiding NSAIDs and trying to drink more water.  However we need to get better control of her blood pressure.  We will increase amlodipine to 10 mg a day and have asked the patient to check her blood pressure daily and notify us of the values in 2 weeks.  I will check a fasting lipid panel and ideally I like her LDL cholesterol to be below 100.  The patient received her flu shot today.  I will check a CBC to evaluate for anemia secondary to chronic kidney disease.  Given her dyspnea, I will check an echocardiogram.

## 2019-08-28 LAB — CBC WITH DIFFERENTIAL/PLATELET
Absolute Monocytes: 646 cells/uL (ref 200–950)
Basophils Absolute: 61 cells/uL (ref 0–200)
Basophils Relative: 0.9 %
Eosinophils Absolute: 129 cells/uL (ref 15–500)
Eosinophils Relative: 1.9 %
HCT: 41.7 % (ref 35.0–45.0)
Hemoglobin: 13.7 g/dL (ref 11.7–15.5)
Lymphs Abs: 2734 cells/uL (ref 850–3900)
MCH: 28.1 pg (ref 27.0–33.0)
MCHC: 32.9 g/dL (ref 32.0–36.0)
MCV: 85.6 fL (ref 80.0–100.0)
MPV: 13.1 fL — ABNORMAL HIGH (ref 7.5–12.5)
Monocytes Relative: 9.5 %
Neutro Abs: 3230 cells/uL (ref 1500–7800)
Neutrophils Relative %: 47.5 %
Platelets: 279 10*3/uL (ref 140–400)
RBC: 4.87 10*6/uL (ref 3.80–5.10)
RDW: 13.5 % (ref 11.0–15.0)
Total Lymphocyte: 40.2 %
WBC: 6.8 10*3/uL (ref 3.8–10.8)

## 2019-08-28 LAB — COMPLETE METABOLIC PANEL WITH GFR
AG Ratio: 1.4 (calc) (ref 1.0–2.5)
ALT: 20 U/L (ref 6–29)
AST: 24 U/L (ref 10–35)
Albumin: 4 g/dL (ref 3.6–5.1)
Alkaline phosphatase (APISO): 108 U/L (ref 37–153)
BUN/Creatinine Ratio: 14 (calc) (ref 6–22)
BUN: 19 mg/dL (ref 7–25)
CO2: 22 mmol/L (ref 20–32)
Calcium: 9.7 mg/dL (ref 8.6–10.4)
Chloride: 104 mmol/L (ref 98–110)
Creat: 1.32 mg/dL — ABNORMAL HIGH (ref 0.60–0.93)
GFR, Est African American: 45 mL/min/{1.73_m2} — ABNORMAL LOW (ref >=60)
GFR, Est Non African American: 39 mL/min/{1.73_m2} — ABNORMAL LOW (ref >=60)
Globulin: 2.8 g/dL (calc) (ref 1.9–3.7)
Glucose, Bld: 93 mg/dL (ref 65–99)
Potassium: 4.5 mmol/L (ref 3.5–5.3)
Sodium: 139 mmol/L (ref 135–146)
Total Bilirubin: 0.4 mg/dL (ref 0.2–1.2)
Total Protein: 6.8 g/dL (ref 6.1–8.1)

## 2019-08-28 LAB — LIPID PANEL
Cholesterol: 159 mg/dL (ref <200)
HDL: 48 mg/dL — ABNORMAL LOW (ref >=50)
LDL Cholesterol (Calc): 89 mg/dL (calc)
Non-HDL Cholesterol (Calc): 111 mg/dL (calc) (ref <130)
Total CHOL/HDL Ratio: 3.3 (calc) (ref <5.0)
Triglycerides: 121 mg/dL (ref <150)

## 2019-09-02 ENCOUNTER — Encounter (HOSPITAL_COMMUNITY): Payer: Self-pay | Admitting: Family Medicine

## 2019-09-07 ENCOUNTER — Telehealth (HOSPITAL_COMMUNITY): Payer: Self-pay

## 2019-09-07 NOTE — Telephone Encounter (Signed)
New message    Just an FYI. We have made several attempts to contact this patient including sending a letter to schedule or reschedule their echocardiogram. We will be removing the patient from the echo WQ.   10.1.20 mail reminder letter Tyris Eliot 9.28.20 @ 3:34pm spoke with family member 9.25.20 @ 3:27pm lm on home vm - Jasmine Mcbeth

## 2019-09-09 ENCOUNTER — Encounter: Payer: Self-pay | Admitting: Family Medicine

## 2019-09-15 ENCOUNTER — Other Ambulatory Visit: Payer: Self-pay

## 2019-09-15 ENCOUNTER — Ambulatory Visit (HOSPITAL_COMMUNITY): Payer: Medicare HMO | Attending: Internal Medicine

## 2019-09-15 DIAGNOSIS — R0602 Shortness of breath: Secondary | ICD-10-CM | POA: Diagnosis not present

## 2019-10-11 ENCOUNTER — Other Ambulatory Visit: Payer: Self-pay | Admitting: Family Medicine

## 2019-11-19 ENCOUNTER — Other Ambulatory Visit: Payer: Self-pay

## 2019-11-19 ENCOUNTER — Ambulatory Visit: Payer: Medicare HMO | Attending: Internal Medicine

## 2019-11-19 DIAGNOSIS — Z20828 Contact with and (suspected) exposure to other viral communicable diseases: Secondary | ICD-10-CM | POA: Diagnosis not present

## 2019-11-19 DIAGNOSIS — Z20822 Contact with and (suspected) exposure to covid-19: Secondary | ICD-10-CM

## 2019-11-20 LAB — NOVEL CORONAVIRUS, NAA: SARS-CoV-2, NAA: DETECTED — AB

## 2019-11-21 ENCOUNTER — Telehealth: Payer: Self-pay | Admitting: Unknown Physician Specialty

## 2019-11-21 ENCOUNTER — Other Ambulatory Visit: Payer: Self-pay | Admitting: Unknown Physician Specialty

## 2019-11-21 DIAGNOSIS — U071 COVID-19: Secondary | ICD-10-CM

## 2019-11-21 NOTE — Telephone Encounter (Signed)
  I connected by phone with Carla Wells on 11/21/2019 at 11:18 AM to discuss the potential use of an new treatment for mild to moderate COVID-19 viral infection in non-hospitalized patients.  This patient is a 78 y.o. female that meets the FDA criteria for Emergency Use Authorization of bamlanivimab or casirivimab\imdevimab.  Has a (+) direct SARS-CoV-2 viral test result  Has mild or moderate COVID-19   Is ? 78 years of age and weighs ? 40 kg  Is NOT hospitalized due to COVID-19  Is NOT requiring oxygen therapy or requiring an increase in baseline oxygen flow rate due to COVID-19  Is within 10 days of symptom onset  Has at least one of the high risk factor(s) for progression to severe COVID-19 and/or hospitalization as defined in EUA.  Specific high risk criteria : >/= 78 yo   I have spoken and communicated the following to the patient or parent/caregiver:  1. FDA has authorized the emergency use of bamlanivimab and casirivimab\imdevimab for the treatment of mild to moderate COVID-19 in adults and pediatric patients with positive results of direct SARS-CoV-2 viral testing who are 105 years of age and older weighing at least 40 kg, and who are at high risk for progressing to severe COVID-19 and/or hospitalization.  2. The significant known and potential risks and benefits of bamlanivimab and casirivimab\imdevimab, and the extent to which such potential risks and benefits are unknown.  3. Information on available alternative treatments and the risks and benefits of those alternatives, including clinical trials.  4. Patients treated with bamlanivimab and casirivimab\imdevimab should continue to self-isolate and use infection control measures (e.g., wear mask, isolate, social distance, avoid sharing personal items, clean and disinfect "high touch" surfaces, and frequent handwashing) according to CDC guidelines.   5. The patient or parent/caregiver has the option to accept or refuse  bamlanivimab or casirivimab\imdevimab .  After reviewing this information with the patient, The patient agreed to proceed with receiving the bamlanimivab infusion and will be provided a copy of the Fact sheet prior to receiving the infusion.Kathrine Haddock 11/21/2019 11:18 AM

## 2019-11-23 ENCOUNTER — Ambulatory Visit (HOSPITAL_COMMUNITY)
Admission: RE | Admit: 2019-11-23 | Discharge: 2019-11-23 | Disposition: A | Discharge status: Home / Self Care | Payer: Medicare Other | Source: Ambulatory Visit | Attending: Pulmonary Disease | Admitting: Pulmonary Disease

## 2019-11-23 ENCOUNTER — Encounter (HOSPITAL_COMMUNITY): Payer: Self-pay

## 2019-11-23 DIAGNOSIS — U071 COVID-19: Secondary | ICD-10-CM | POA: Diagnosis not present

## 2019-11-23 DIAGNOSIS — Z23 Encounter for immunization: Secondary | ICD-10-CM | POA: Diagnosis present

## 2019-11-23 MED ORDER — DIPHENHYDRAMINE HCL 50 MG/ML IJ SOLN: 50.0000 mg | Freq: Once | INTRAMUSCULAR | Status: DC | PRN

## 2019-11-23 MED ORDER — SODIUM CHLORIDE 0.9 % IV SOLN
700.0000 mg | Freq: Once | INTRAVENOUS | Status: AC
  Administered 2019-11-23: 700 mg via INTRAVENOUS
  Filled 2019-11-23: qty 20

## 2019-11-23 MED ORDER — EPINEPHRINE 0.3 MG/0.3ML IJ SOAJ: 0.3000 mg | Freq: Once | INTRAMUSCULAR | Status: DC | PRN

## 2019-11-23 MED ORDER — FAMOTIDINE IN NACL 20-0.9 MG/50ML-% IV SOLN: 20.0000 mg | Freq: Once | INTRAVENOUS | Status: DC | PRN

## 2019-11-23 MED ORDER — SODIUM CHLORIDE 0.9 % IV SOLN
INTRAVENOUS | Status: DC | PRN
  Administered 2019-11-23: 250 mL via INTRAVENOUS

## 2019-11-23 MED ORDER — ALBUTEROL SULFATE HFA 108 (90 BASE) MCG/ACT IN AERS: 2.0000 | INHALATION_SPRAY | Freq: Once | RESPIRATORY_TRACT | Status: DC | PRN

## 2019-11-23 MED ORDER — METHYLPREDNISOLONE SODIUM SUCC 125 MG IJ SOLR: 125.0000 mg | Freq: Once | INTRAMUSCULAR | Status: DC | PRN

## 2019-11-23 NOTE — Progress Notes (Signed)
  Diagnosis: COVID-19  Physician:Dr Joya Gaskins  Procedure: Covid Infusion Clinic Med: bamlanivimab infusion - Provided patient with bamlanimivab fact sheet for patients, parents and caregivers prior to infusion.  Complications: No immediate complications noted.  Discharge: Discharged home   Dewaine Oats 11/23/2019

## 2019-11-23 NOTE — Discharge Instructions (Signed)

## 2019-12-31 ENCOUNTER — Ambulatory Visit: Payer: Medicare Other

## 2020-01-10 DIAGNOSIS — H401131 Primary open-angle glaucoma, bilateral, mild stage: Secondary | ICD-10-CM | POA: Diagnosis not present

## 2020-01-11 ENCOUNTER — Ambulatory Visit: Payer: Medicare Other

## 2020-01-11 ENCOUNTER — Ambulatory Visit: Payer: Medicare HMO | Attending: Internal Medicine

## 2020-01-11 DIAGNOSIS — Z23 Encounter for immunization: Secondary | ICD-10-CM

## 2020-01-11 NOTE — Progress Notes (Signed)
   Covid-19 Vaccination Clinic  Name:  Carla Wells    MRN: [id number] DOB: [birth year]  01/11/2020  Carla Wells was observed post Covid-19 immunization for 15 minutes without incidence. She was provided with Vaccine Information Sheet and instruction to access the V-Safe system.   Carla Wells was instructed to call 911 with any severe reactions post vaccine: Marland Kitchen Difficulty breathing  . Swelling of your face and throat  . A fast heartbeat  . A bad rash all over your body  . Dizziness and weakness    Immunizations Administered    Name Date Dose VIS Date Route   Pfizer COVID-19 Vaccine 01/11/2020  8:35 AM 0.3 mL 11/12/2019 Intramuscular   Manufacturer: [place]   Lot: [id number]   [place]: [id number]

## 2020-01-17 ENCOUNTER — Other Ambulatory Visit: Payer: Self-pay

## 2020-01-17 MED ORDER — AMLODIPINE BESYLATE 10 MG PO TABS: 10.0000 mg | ORAL_TABLET | Freq: Every day | ORAL | 1 refills | Status: DC

## 2020-01-26 ENCOUNTER — Other Ambulatory Visit: Payer: Self-pay | Admitting: Family Medicine

## 2020-01-31 ENCOUNTER — Other Ambulatory Visit: Payer: Self-pay

## 2020-01-31 ENCOUNTER — Ambulatory Visit (INDEPENDENT_AMBULATORY_CARE_PROVIDER_SITE_OTHER): Payer: Medicare HMO | Admitting: Family Medicine

## 2020-01-31 VITALS — BP 108/50 | HR 92 | Temp 97.1°F | Resp 18 | Ht 64.0 in | Wt 201.0 lb

## 2020-01-31 DIAGNOSIS — M109 Gout, unspecified: Secondary | ICD-10-CM

## 2020-01-31 MED ORDER — METHYLPREDNISOLONE ACETATE 80 MG/ML IJ SUSP
60.0000 mg | Freq: Once | INTRAMUSCULAR | Status: AC
  Administered 2020-01-31: 60 mg via INTRAMUSCULAR

## 2020-01-31 MED ORDER — COLCHICINE 0.6 MG PO TABS: 0.6000 mg | ORAL_TABLET | Freq: Every day | ORAL | 0 refills | Status: DC

## 2020-01-31 NOTE — Progress Notes (Signed)
Subjective:    Patient ID: Carla Wells, female    DOB: 04/28/1941, 79 y.o.   MRN: [id number]  HPI  Patient reports a 3-day history of severe pain in her left first MTP joint.  The joint is red hot and swollen.  She denies any cuts or penetrating trauma to the area that could have caused an infection.  The skin is warm to the touch and extremely tender to the touch.  It hurts to bend the joint.  She does have a remote history of gout in the past although she has not had a gout exacerbation in several years.  She does have chronic kidney disease. Past Medical History:  Diagnosis Date  . Abnormal pap   . Cataract   . Colon polyps   . Hyperlipidemia   . Hypertension   . Murmur    No past surgical history on file. Current Outpatient Medications on File Prior to Visit  Medication Sig Dispense Refill  . albuterol (PROVENTIL HFA;VENTOLIN HFA) 108 (90 Base) MCG/ACT inhaler INHALE 2 PUFFS INTO THE LUNGS EVERY 4 (FOUR) HOURS AS NEEDED FOR WHEEZING OR SHORTNESS OF BREATH. 6.7 Inhaler 5  . amLODipine (NORVASC) 10 MG tablet Take 1 tablet (10 mg total) by mouth daily. 90 tablet 1  . aspirin 81 MG tablet Take 81 mg by mouth daily.    . Calcium Citrate-Vitamin D (CALCIUM + D PO) Take 1 tablet by mouth daily.    Marland Kitchen latanoprost (XALATAN) 0.005 % ophthalmic solution     . losartan-hydrochlorothiazide (HYZAAR) 50-12.5 MG tablet TAKE 1 TABLET EVERY DAY 90 tablet 3  . omeprazole (PRILOSEC) 40 MG capsule TAKE 1 CAPSULE EVERY DAY 90 capsule 3  . simvastatin (ZOCOR) 10 MG tablet TAKE 1 TABLET AT BEDTIME 90 tablet 1  . Vitamin D, Cholecalciferol, 1000 UNITS CAPS Take 1,000 mg by mouth.     No current facility-administered medications on file prior to visit.   Allergies  Allergen Reactions  . Penicillins    Social History   Socioeconomic History  . Marital status: Single    Spouse name: Not on file  . Number of children: Not on file  . Years of education: Not on file  . Highest education level:  Not on file  Occupational History  . Not on file  Tobacco Use  . Smoking status: Never Smoker  . Smokeless tobacco: Former Systems developer    Types: Snuff  Substance and Sexual Activity  . Alcohol use: No  . Drug use: No  . Sexual activity: Not on file  Other Topics Concern  . Not on file  Social History Narrative  . Not on file   Social Determinants of Health   Financial Resource Strain:   . Difficulty of Paying Living Expenses: Not on file  Food Insecurity:   . Worried About Charity fundraiser in the Last Year: Not on file  . Ran Out of Food in the Last Year: Not on file  Transportation Needs:   . Lack of Transportation (Medical): Not on file  . Lack of Transportation (Non-Medical): Not on file  Physical Activity:   . Days of Exercise per Week: Not on file  . Minutes of Exercise per Session: Not on file  Stress:   . Feeling of Stress : Not on file  Social Connections:   . Frequency of Communication with Friends and Family: Not on file  . Frequency of Social Gatherings with Friends and Family: Not on file  . Attends  Religious Services: Not on file  . Active Member of Clubs or Organizations: Not on file  . Attends Archivist Meetings: Not on file  . Marital Status: Not on file  Intimate Partner Violence:   . Fear of Current or Ex-Partner: Not on file  . Emotionally Abused: Not on file  . Physically Abused: Not on file  . Sexually Abused: Not on file     Review of Systems  All other systems reviewed and are negative.      Objective:   Physical Exam Vitals reviewed.  Constitutional:      Appearance: Normal appearance.  Cardiovascular:     Rate and Rhythm: Normal rate and regular rhythm.     Heart sounds: Normal heart sounds.  Pulmonary:     Effort: Pulmonary effort is normal.     Breath sounds: Normal breath sounds.  Musculoskeletal:     Left foot: Decreased range of motion. Swelling, tenderness and bony tenderness present.  Neurological:     Mental  Status: She is alert.             Assessment & Plan:  Podagra - Plan: Uric acid, CBC with Differential/Platelet, COMPLETE METABOLIC PANEL WITH GFR  Suspect gout flare.  60 mg of Depo-Medrol IM x1.  Begin colchicine 2 tablets immediately then repeat 1 tablet 0.6 mg in 1 hour if pain persists.  Repeat this process daily until better.  Check uric acid level along with CMP and CBC.  Consider allopurinol if uric acid level is extremely high to prevent future outbreaks.

## 2020-01-31 NOTE — Addendum Note (Signed)
Addended by: Shary Decamp B on: 01/31/2020 10:52 AM   Modules accepted: Orders

## 2020-02-01 LAB — COMPLETE METABOLIC PANEL WITH GFR
AG Ratio: 1.3 (calc) (ref 1.0–2.5)
ALT: 16 U/L (ref 6–29)
AST: 15 U/L (ref 10–35)
Albumin: 4 g/dL (ref 3.6–5.1)
Alkaline phosphatase (APISO): 117 U/L (ref 37–153)
BUN/Creatinine Ratio: 16 (calc) (ref 6–22)
BUN: 18 mg/dL (ref 7–25)
CO2: 26 mmol/L (ref 20–32)
Calcium: 10 mg/dL (ref 8.6–10.4)
Chloride: 102 mmol/L (ref 98–110)
Creat: 1.11 mg/dL — ABNORMAL HIGH (ref 0.60–0.93)
GFR, Est African American: 55 mL/min/{1.73_m2} — ABNORMAL LOW (ref >=60)
GFR, Est Non African American: 48 mL/min/{1.73_m2} — ABNORMAL LOW (ref >=60)
Globulin: 3.2 g/dL (calc) (ref 1.9–3.7)
Glucose, Bld: 113 mg/dL — ABNORMAL HIGH (ref 65–99)
Potassium: 3.8 mmol/L (ref 3.5–5.3)
Sodium: 137 mmol/L (ref 135–146)
Total Bilirubin: 0.5 mg/dL (ref 0.2–1.2)
Total Protein: 7.2 g/dL (ref 6.1–8.1)

## 2020-02-01 LAB — CBC WITH DIFFERENTIAL/PLATELET
Absolute Monocytes: 1003 cells/uL — ABNORMAL HIGH (ref 200–950)
Basophils Absolute: 37 cells/uL (ref 0–200)
Basophils Relative: 0.4 %
Eosinophils Absolute: 92 cells/uL (ref 15–500)
Eosinophils Relative: 1 %
HCT: 40.9 % (ref 35.0–45.0)
Hemoglobin: 13.4 g/dL (ref 11.7–15.5)
Lymphs Abs: 2788 cells/uL (ref 850–3900)
MCH: 27.7 pg (ref 27.0–33.0)
MCHC: 32.8 g/dL (ref 32.0–36.0)
MCV: 84.7 fL (ref 80.0–100.0)
MPV: 12 fL (ref 7.5–12.5)
Monocytes Relative: 10.9 %
Neutro Abs: 5281 cells/uL (ref 1500–7800)
Neutrophils Relative %: 57.4 %
Platelets: 273 10*3/uL (ref 140–400)
RBC: 4.83 10*6/uL (ref 3.80–5.10)
RDW: 13.9 % (ref 11.0–15.0)
Total Lymphocyte: 30.3 %
WBC: 9.2 10*3/uL (ref 3.8–10.8)

## 2020-02-01 LAB — URIC ACID: Uric Acid, Serum: 9.5 mg/dL — ABNORMAL HIGH (ref 2.5–7.0)

## 2020-02-02 ENCOUNTER — Other Ambulatory Visit: Payer: Self-pay | Admitting: Family Medicine

## 2020-02-02 MED ORDER — MITIGARE 0.6 MG PO CAPS: ORAL_CAPSULE | ORAL | 2 refills | Status: DC

## 2020-02-05 ENCOUNTER — Ambulatory Visit: Payer: Medicare HMO | Attending: Internal Medicine

## 2020-02-05 DIAGNOSIS — Z23 Encounter for immunization: Secondary | ICD-10-CM

## 2020-02-05 NOTE — Progress Notes (Signed)
   Covid-19 Vaccination Clinic  Name:  Carla Wells    MRN: [id number] DOB: 1941-09-30  02/05/2020  Carla Wells was observed post Covid-19 immunization for 30 minutes based on pre-vaccination screening without incident. She was provided with Vaccine Information Sheet and instruction to access the V-Safe system.   Carla Wells was instructed to call 911 with any severe reactions post vaccine: Marland Kitchen Difficulty breathing  . Swelling of face and throat  . A fast heartbeat  . A bad rash all over body  . Dizziness and weakness   Immunizations Administered    Name Date Dose VIS Date Route   Pfizer COVID-19 Vaccine 02/05/2020  8:25 AM 0.3 mL 11/12/2019 Intramuscular   Manufacturer: [place]   Lot: [id number]   [place]: [id number]

## 2020-04-11 ENCOUNTER — Other Ambulatory Visit: Payer: Self-pay | Admitting: Family Medicine

## 2020-04-20 ENCOUNTER — Encounter: Payer: Self-pay | Admitting: Family Medicine

## 2020-04-20 ENCOUNTER — Ambulatory Visit (INDEPENDENT_AMBULATORY_CARE_PROVIDER_SITE_OTHER): Payer: Medicare HMO | Admitting: Family Medicine

## 2020-04-20 ENCOUNTER — Other Ambulatory Visit: Payer: Self-pay

## 2020-04-20 VITALS — BP 138/70 | HR 90 | Temp 97.4°F | Resp 16 | Ht 64.0 in | Wt 204.0 lb

## 2020-04-20 DIAGNOSIS — H9311 Tinnitus, right ear: Secondary | ICD-10-CM | POA: Diagnosis not present

## 2020-04-20 DIAGNOSIS — H9111 Presbycusis, right ear: Secondary | ICD-10-CM | POA: Diagnosis not present

## 2020-04-20 NOTE — Progress Notes (Signed)
Subjective:    Patient ID: Carla Wells, female    DOB: Oct 18, 1941, 79 y.o.   MRN: [id number]  HPI  Patient reports a roaring/ringing sound in her right ear.  This is gone on now for several months.  She denies any ear pain.  She denies any vertigo.  She denies any headaches.  Hearing screen today shows high-frequency hearing loss in the right ear consistent with presbycusis.  She can hear 500 Hz to 40 dB.  She can hear 1000 Hz to 40 dB.  However she is unable to hear 2000 4000 Hz in the right ear.  Left ear also shows some high-frequency hearing loss though not as severe.  There is no evidence of an ear infection.  There is no evidence of a cerumen impaction.  Visual inspection of the eardrum is normal. Past Medical History:  Diagnosis Date  . Abnormal pap   . Cataract   . Colon polyps   . Hyperlipidemia   . Hypertension   . Murmur    No past surgical history on file. Current Outpatient Medications on File Prior to Visit  Medication Sig Dispense Refill  . albuterol (PROVENTIL HFA;VENTOLIN HFA) 108 (90 Base) MCG/ACT inhaler INHALE 2 PUFFS INTO THE LUNGS EVERY 4 (FOUR) HOURS AS NEEDED FOR WHEEZING OR SHORTNESS OF BREATH. 6.7 Inhaler 5  . amLODipine (NORVASC) 10 MG tablet Take 1 tablet (10 mg total) by mouth daily. 90 tablet 1  . aspirin 81 MG tablet Take 81 mg by mouth daily.    . Calcium Citrate-Vitamin D (CALCIUM + D PO) Take 1 tablet by mouth daily.    Marland Kitchen latanoprost (XALATAN) 0.005 % ophthalmic solution     . losartan-hydrochlorothiazide (HYZAAR) 50-12.5 MG tablet TAKE 1 TABLET EVERY DAY 90 tablet 3  . omeprazole (PRILOSEC) 40 MG capsule TAKE 1 CAPSULE EVERY DAY 90 capsule 3  . simvastatin (ZOCOR) 10 MG tablet TAKE 1 TABLET AT BEDTIME 90 tablet 1  . Vitamin D, Cholecalciferol, 1000 UNITS CAPS Take 1,000 mg by mouth.    Marland Kitchen MITIGARE 0.6 MG CAPS 2 tablets immediately then repeat 1 tablet 0.6 mg in 1 hour if pain persists.  Repeat this process daily until better (Patient not taking:  Reported on 04/20/2020) 90 capsule 2   No current facility-administered medications on file prior to visit.   Allergies  Allergen Reactions  . Penicillins    Social History   Socioeconomic History  . Marital status: Single    Spouse name: Not on file  . Number of children: Not on file  . Years of education: Not on file  . Highest education level: Not on file  Occupational History  . Not on file  Tobacco Use  . Smoking status: Never Smoker  . Smokeless tobacco: Former Systems developer    Types: Snuff  Substance and Sexual Activity  . Alcohol use: No  . Drug use: No  . Sexual activity: Not on file  Other Topics Concern  . Not on file  Social History Narrative  . Not on file   Social Determinants of Health   Financial Resource Strain:   . Difficulty of Paying Living Expenses:   Food Insecurity:   . Worried About Charity fundraiser in the Last Year:   . Arboriculturist in the Last Year:   Transportation Needs:   . Film/video editor (Medical):   Marland Kitchen Lack of Transportation (Non-Medical):   Physical Activity:   . Days of Exercise per Week:   .  Minutes of Exercise per Session:   Stress:   . Feeling of Stress :   Social Connections:   . Frequency of Communication with Friends and Family:   . Frequency of Social Gatherings with Friends and Family:   . Attends Religious Services:   . Active Member of Clubs or Organizations:   . Attends Archivist Meetings:   Marland Kitchen Marital Status:   Intimate Partner Violence:   . Fear of Current or Ex-Partner:   . Emotionally Abused:   Marland Kitchen Physically Abused:   . Sexually Abused:      Review of Systems  All other systems reviewed and are negative.      Objective:   Physical Exam Vitals reviewed.  Constitutional:      Appearance: Normal appearance.  HENT:     Right Ear: Tympanic membrane and ear canal normal. Decreased hearing noted. No swelling or tenderness. No middle ear effusion. There is no impacted cerumen. Tympanic membrane  is not injected, scarred, perforated or erythematous.     Left Ear: Hearing, tympanic membrane and ear canal normal. There is no impacted cerumen.     Nose: Nose normal. No congestion or rhinorrhea.  Cardiovascular:     Rate and Rhythm: Normal rate and regular rhythm.     Heart sounds: Normal heart sounds.  Pulmonary:     Effort: Pulmonary effort is normal.     Breath sounds: Normal breath sounds.  Musculoskeletal:     Left foot: Decreased range of motion. Swelling, tenderness and bony tenderness present.  Neurological:     Mental Status: She is alert.          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  At the present time the patient is not bothered by this enough to justify hearing aid.  Therefore work she politely declines an Environmental manager.  If the symptoms worsen we can certainly set that up related.  Patient will notify me if symptoms worsen or change.

## 2020-05-26 ENCOUNTER — Encounter: Payer: Self-pay | Admitting: Family Medicine

## 2020-05-26 DIAGNOSIS — Z1231 Encounter for screening mammogram for malignant neoplasm of breast: Secondary | ICD-10-CM | POA: Diagnosis not present

## 2020-06-15 ENCOUNTER — Other Ambulatory Visit: Payer: Self-pay | Admitting: Family Medicine

## 2020-09-03 ENCOUNTER — Emergency Department (HOSPITAL_COMMUNITY): Payer: Medicare HMO

## 2020-09-03 ENCOUNTER — Encounter (HOSPITAL_COMMUNITY): Payer: Self-pay | Admitting: Emergency Medicine

## 2020-09-03 ENCOUNTER — Inpatient Hospital Stay (HOSPITAL_COMMUNITY)
Admission: EM | Admit: 2020-09-03 | Discharge: 2020-09-06 | DRG: 175 | Disposition: A | Discharge status: Home / Self Care | Payer: Medicare HMO | Attending: Internal Medicine | Admitting: Internal Medicine

## 2020-09-03 ENCOUNTER — Other Ambulatory Visit: Payer: Self-pay

## 2020-09-03 DIAGNOSIS — Z880 Allergy status to penicillin: Secondary | ICD-10-CM

## 2020-09-03 DIAGNOSIS — E785 Hyperlipidemia, unspecified: Secondary | ICD-10-CM | POA: Diagnosis present

## 2020-09-03 DIAGNOSIS — R0902 Hypoxemia: Secondary | ICD-10-CM | POA: Diagnosis not present

## 2020-09-03 DIAGNOSIS — Z841 Family history of disorders of kidney and ureter: Secondary | ICD-10-CM

## 2020-09-03 DIAGNOSIS — N189 Chronic kidney disease, unspecified: Secondary | ICD-10-CM | POA: Diagnosis present

## 2020-09-03 DIAGNOSIS — I152 Hypertension secondary to endocrine disorders: Secondary | ICD-10-CM | POA: Diagnosis not present

## 2020-09-03 DIAGNOSIS — E1159 Type 2 diabetes mellitus with other circulatory complications: Secondary | ICD-10-CM | POA: Diagnosis not present

## 2020-09-03 DIAGNOSIS — R000 Tachycardia, unspecified: Secondary | ICD-10-CM | POA: Diagnosis not present

## 2020-09-03 DIAGNOSIS — Z7982 Long term (current) use of aspirin: Secondary | ICD-10-CM

## 2020-09-03 DIAGNOSIS — E669 Obesity, unspecified: Secondary | ICD-10-CM | POA: Diagnosis present

## 2020-09-03 DIAGNOSIS — E869 Volume depletion, unspecified: Secondary | ICD-10-CM | POA: Diagnosis present

## 2020-09-03 DIAGNOSIS — R0602 Shortness of breath: Secondary | ICD-10-CM | POA: Diagnosis not present

## 2020-09-03 DIAGNOSIS — Z833 Family history of diabetes mellitus: Secondary | ICD-10-CM

## 2020-09-03 DIAGNOSIS — Z79899 Other long term (current) drug therapy: Secondary | ICD-10-CM

## 2020-09-03 DIAGNOSIS — Z8616 Personal history of COVID-19: Secondary | ICD-10-CM | POA: Diagnosis not present

## 2020-09-03 DIAGNOSIS — E876 Hypokalemia: Secondary | ICD-10-CM | POA: Diagnosis not present

## 2020-09-03 DIAGNOSIS — Z20822 Contact with and (suspected) exposure to covid-19: Secondary | ICD-10-CM | POA: Diagnosis not present

## 2020-09-03 DIAGNOSIS — I213 ST elevation (STEMI) myocardial infarction of unspecified site: Secondary | ICD-10-CM | POA: Diagnosis not present

## 2020-09-03 DIAGNOSIS — M8589 Other specified disorders of bone density and structure, multiple sites: Secondary | ICD-10-CM | POA: Diagnosis present

## 2020-09-03 DIAGNOSIS — Z6835 Body mass index (BMI) 35.0-35.9, adult: Secondary | ICD-10-CM

## 2020-09-03 DIAGNOSIS — I2699 Other pulmonary embolism without acute cor pulmonale: Secondary | ICD-10-CM | POA: Diagnosis present

## 2020-09-03 DIAGNOSIS — R0689 Other abnormalities of breathing: Secondary | ICD-10-CM | POA: Diagnosis not present

## 2020-09-03 DIAGNOSIS — I2609 Other pulmonary embolism with acute cor pulmonale: Secondary | ICD-10-CM | POA: Diagnosis not present

## 2020-09-03 DIAGNOSIS — E1110 Type 2 diabetes mellitus with ketoacidosis without coma: Secondary | ICD-10-CM | POA: Diagnosis not present

## 2020-09-03 DIAGNOSIS — E1122 Type 2 diabetes mellitus with diabetic chronic kidney disease: Secondary | ICD-10-CM | POA: Diagnosis present

## 2020-09-03 DIAGNOSIS — N1831 Chronic kidney disease, stage 3a: Secondary | ICD-10-CM | POA: Diagnosis present

## 2020-09-03 DIAGNOSIS — R739 Hyperglycemia, unspecified: Secondary | ICD-10-CM

## 2020-09-03 DIAGNOSIS — I2602 Saddle embolus of pulmonary artery with acute cor pulmonale: Secondary | ICD-10-CM | POA: Diagnosis not present

## 2020-09-03 DIAGNOSIS — N179 Acute kidney failure, unspecified: Secondary | ICD-10-CM | POA: Diagnosis present

## 2020-09-03 DIAGNOSIS — E1165 Type 2 diabetes mellitus with hyperglycemia: Secondary | ICD-10-CM | POA: Diagnosis not present

## 2020-09-03 DIAGNOSIS — E1169 Type 2 diabetes mellitus with other specified complication: Secondary | ICD-10-CM | POA: Diagnosis not present

## 2020-09-03 DIAGNOSIS — I248 Other forms of acute ischemic heart disease: Secondary | ICD-10-CM | POA: Diagnosis not present

## 2020-09-03 DIAGNOSIS — Z87891 Personal history of nicotine dependence: Secondary | ICD-10-CM

## 2020-09-03 HISTORY — DX: Type 2 diabetes mellitus without complications: E11.9

## 2020-09-03 LAB — I-STAT VENOUS BLOOD GAS, ED
Acid-base deficit: 11 mmol/L — ABNORMAL HIGH (ref 0.0–2.0)
Bicarbonate: 16.9 mmol/L — ABNORMAL LOW (ref 20.0–28.0)
Calcium, Ion: 1.19 mmol/L (ref 1.15–1.40)
HCT: 42 % (ref 36.0–46.0)
Hemoglobin: 14.3 g/dL (ref 12.0–15.0)
O2 Saturation: 78 %
Potassium: 4.8 mmol/L (ref 3.5–5.1)
Sodium: 136 mmol/L (ref 135–145)
TCO2: 18 mmol/L — ABNORMAL LOW (ref 22–32)
pCO2, Ven: 42.1 mmHg — ABNORMAL LOW (ref 44.0–60.0)
pH, Ven: 7.211 — ABNORMAL LOW (ref 7.250–7.430)
pO2, Ven: 52 mmHg — ABNORMAL HIGH (ref 32.0–45.0)

## 2020-09-03 LAB — CBG MONITORING, ED
Glucose-Capillary: 494 mg/dL — ABNORMAL HIGH (ref 70–99)
Glucose-Capillary: 455 mg/dL — ABNORMAL HIGH (ref 70–99)
Glucose-Capillary: 322 mg/dL — ABNORMAL HIGH (ref 70–99)
Glucose-Capillary: 207 mg/dL — ABNORMAL HIGH (ref 70–99)
Glucose-Capillary: 131 mg/dL — ABNORMAL HIGH (ref 70–99)

## 2020-09-03 LAB — URINALYSIS, ROUTINE W REFLEX MICROSCOPIC
Bacteria, UA: NONE SEEN
Bilirubin Urine: NEGATIVE
Glucose, UA: >=500 mg/dL — AB
Ketones, ur: 20 mg/dL — AB
Nitrite: NEGATIVE
Protein, ur: NEGATIVE mg/dL
Specific Gravity, Urine: 1.028 (ref 1.005–1.030)
pH: 5 (ref 5.0–8.0)

## 2020-09-03 LAB — BASIC METABOLIC PANEL
Anion gap: 17 — ABNORMAL HIGH (ref 5–15)
BUN: 28 mg/dL — ABNORMAL HIGH (ref 8–23)
CO2: 18 mmol/L — ABNORMAL LOW (ref 22–32)
Calcium: 9.2 mg/dL (ref 8.9–10.3)
Chloride: 96 mmol/L — ABNORMAL LOW (ref 98–111)
Creatinine, Ser: 1.54 mg/dL — ABNORMAL HIGH (ref 0.44–1.00)
GFR calc Af Amer: 37 mL/min — ABNORMAL LOW (ref >60)
GFR calc non Af Amer: 32 mL/min — ABNORMAL LOW (ref >60)
Glucose, Bld: 492 mg/dL — ABNORMAL HIGH (ref 70–99)
Potassium: 4.3 mmol/L (ref 3.5–5.1)
Sodium: 131 mmol/L — ABNORMAL LOW (ref 135–145)

## 2020-09-03 LAB — D-DIMER, QUANTITATIVE: D-Dimer, Quant: 5.01 ug/mL-FEU — ABNORMAL HIGH (ref 0.00–0.50)

## 2020-09-03 LAB — CBC
HCT: 41.1 % (ref 36.0–46.0)
Hemoglobin: 13.6 g/dL (ref 12.0–15.0)
MCH: 27.9 pg (ref 26.0–34.0)
MCHC: 33.1 g/dL (ref 30.0–36.0)
MCV: 84.2 fL (ref 80.0–100.0)
Platelets: 155 10*3/uL (ref 150–400)
RBC: 4.88 MIL/uL (ref 3.87–5.11)
RDW: 13.8 % (ref 11.5–15.5)
WBC: 10.7 10*3/uL — ABNORMAL HIGH (ref 4.0–10.5)
nRBC: 0 % (ref 0.0–0.2)

## 2020-09-03 LAB — HEMOGLOBIN A1C
Hgb A1c MFr Bld: 16 % — ABNORMAL HIGH (ref 4.8–5.6)
Mean Plasma Glucose: 412.5 mg/dL

## 2020-09-03 LAB — TROPONIN I (HIGH SENSITIVITY)
Troponin I (High Sensitivity): 65 ng/L — ABNORMAL HIGH (ref <18)
Troponin I (High Sensitivity): 183 ng/L (ref <18)

## 2020-09-03 LAB — RESPIRATORY PANEL BY RT PCR (FLU A&B, COVID)
Influenza A by PCR: NEGATIVE
Influenza B by PCR: NEGATIVE
SARS Coronavirus 2 by RT PCR: NEGATIVE

## 2020-09-03 LAB — BETA-HYDROXYBUTYRIC ACID: Beta-Hydroxybutyric Acid: 6.36 mmol/L — ABNORMAL HIGH (ref 0.05–0.27)

## 2020-09-03 LAB — BRAIN NATRIURETIC PEPTIDE: B Natriuretic Peptide: 195.3 pg/mL — ABNORMAL HIGH (ref 0.0–100.0)

## 2020-09-03 MED ORDER — SODIUM CHLORIDE 0.9 % IV BOLUS
1000.0000 mL | Freq: Once | INTRAVENOUS | Status: AC
  Administered 2020-09-03: 1000 mL via INTRAVENOUS

## 2020-09-03 MED ORDER — SIMVASTATIN 20 MG PO TABS
10.0000 mg | ORAL_TABLET | Freq: Every day | ORAL | Status: DC
  Administered 2020-09-03 – 2020-09-05 (×3): 10 mg via ORAL
  Filled 2020-09-03 (×3): qty 1

## 2020-09-03 MED ORDER — AMLODIPINE BESYLATE 5 MG PO TABS: 10.0000 mg | ORAL_TABLET | Freq: Every day | ORAL | Status: DC

## 2020-09-03 MED ORDER — DEXTROSE 50 % IV SOLN: 0.0000 mL | INTRAVENOUS | Status: DC | PRN

## 2020-09-03 MED ORDER — DEXTROSE IN LACTATED RINGERS 5 % IV SOLN
INTRAVENOUS | Status: DC
  Administered 2020-09-03: 1000 mL via INTRAVENOUS

## 2020-09-03 MED ORDER — HEPARIN BOLUS VIA INFUSION
4000.0000 [IU] | Freq: Once | INTRAVENOUS | Status: AC
  Administered 2020-09-03: 4000 [IU] via INTRAVENOUS
  Filled 2020-09-03: qty 4000

## 2020-09-03 MED ORDER — HEPARIN (PORCINE) 25000 UT/250ML-% IV SOLN
1000.0000 [IU]/h | INTRAVENOUS | Status: DC
  Administered 2020-09-03: 1200 [IU]/h via INTRAVENOUS
  Administered 2020-09-04: 1000 [IU]/h via INTRAVENOUS
  Filled 2020-09-03 (×2): qty 250

## 2020-09-03 MED ORDER — INSULIN REGULAR(HUMAN) IN NACL 100-0.9 UT/100ML-% IV SOLN
INTRAVENOUS | Status: DC
  Administered 2020-09-03: 13 [IU]/h via INTRAVENOUS
  Filled 2020-09-03: qty 100

## 2020-09-03 MED ORDER — LACTATED RINGERS IV SOLN: INTRAVENOUS | Status: DC

## 2020-09-03 MED ORDER — POTASSIUM CHLORIDE 10 MEQ/100ML IV SOLN
10.0000 meq | INTRAVENOUS | Status: AC
  Administered 2020-09-03 (×2): 10 meq via INTRAVENOUS
  Filled 2020-09-03 (×2): qty 100

## 2020-09-03 MED ORDER — IOHEXOL 350 MG/ML SOLN
60.0000 mL | Freq: Once | INTRAVENOUS | Status: AC | PRN
  Administered 2020-09-03: 60 mL via INTRAVENOUS

## 2020-09-03 NOTE — Progress Notes (Signed)
ANTICOAGULATION CONSULT NOTE - Initial Consult  Pharmacy Consult for heparin Indication: chest pain/ACS / ?PE   Allergies  Allergen Reactions  . Penicillins     Patient Measurements:   Heparin Dosing Weight: 74 kg   Vital Signs: Temp: 97.8 F (36.6 C) (10/03 1200) Temp Source: Oral (10/03 1200) BP: 144/92 (10/03 1623) Pulse Rate: 100 (10/03 1623)  Labs: Recent Labs    09/03/20 0936 09/03/20 1618  HGB 13.6  --   HCT 41.1  --   PLT 155  --   CREATININE 1.54*  --   TROPONINIHS 65* 183*    CrCl cannot be calculated (Unknown ideal weight.).   Medical History: Past Medical History:  Diagnosis Date  . Abnormal pap   . Cataract   . Colon polyps   . Diabetes mellitus without complication ([place])   . Hyperlipidemia   . Hypertension   . Murmur     Medications:  (Not in a hospital admission)   Assessment: 5 YOF who presents with SOB and elevated troponins concerning for ACS vs. PE. Pharmacy consulted to start IV heparin   H/H and Plt wnl. SCr 1.54, D-dimer 5.01   Goal of Therapy:  Heparin level 0.3-0.7 units/ml Monitor platelets by anticoagulation protocol: Yes   Plan:  -Heparin 4000 units IV bolus followed by iv heparin infusion at 1200 units/hr -F/u 8 hr HL -Monitor daily HL, CBC and s/s of bleeding   Albertina Parr, PharmD., BCPS, BCCCP Clinical Pharmacist Clinical phone for 09/03/20 until 10pm: [phone] If after 10pm, please refer to [facility] for unit-specific pharmacist

## 2020-09-03 NOTE — ED Notes (Signed)
EDP made aware of eleavted Troponin; now new orders at this time.

## 2020-09-03 NOTE — ED Notes (Signed)
ED Provider at bedside. 

## 2020-09-03 NOTE — Consult Note (Signed)
NAME:  Carla Wells, MRN:  [id number], DOB:  15-Mar-1941, LOS: 0 ADMISSION DATE:  09/03/2020, CONSULTATION DATE:  09/04/20 REFERRING MD:  EDP, CHIEF COMPLAINT:  Shortness of breath  Brief History   79 y.o.F with PMH of DM, HTN, HL who presented with approximately one month of exertional dyspnea and was found to have bilateral distal pulmonary emboli with evidence of R heart strain on CT.  Pt stable and started on heparin, PCCM consulted for further recommendations.  History of present illness   Carla Wells is a 79 y.o.F with PMH of DM, HTN, HL, CKD who presented with approximately one month of exertional dyspnea.  Denies leg swelling, chest pain, vertigo, syncopal episodes, recent trauma or surgery, travel or any other immobility.  She has a nephew who had DVTs and a history of ovarian cancer in her sister.  Last colonoscopy was 10 years ago and significant for polyps, she does not known when her last pap smear/gyn visit was.   In the ED, pt was tachycardic with HR 110's and O2 sats as low as 91%, CT chest was obtained which showed large burden of embolus present in the distal pulmonary arteries and nearly all distal lobar and segmental branch vessels.  Labs showed hyperglycemia, troponin 65 rising to 183, BNP 195.  Patient on room air without hypotension.  Past Medical History   has a past medical history of Abnormal pap, Cataract, Colon polyps, Diabetes mellitus without complication ([place]), Hyperlipidemia, Hypertension, and Murmur.   Significant Hospital Events   10/3 Admit to hospitalists  Consults:  PCCM  Procedures:    Significant Diagnostic Tests:  10/4 CT chest>> Positive examination for pulmonary embolism with a large burden of embolus present in the distal pulmonary arteries and nearly all distal lobar and segmental branch vessels. Enlargement of the RV LV ratio approximately 1.6-1, concerning for right heart strain. The main pulmonary artery is normal in caliber measuring 2.7  cm.  Micro Data:  10/3 Covid and Flu>>negative  Antimicrobials:     Interim history/subjective:  As above  Objective   Blood pressure 125/61, pulse (!) 102, temperature 97.8 F (36.6 C), temperature source Oral, resp. rate (!) 24, SpO2 97 %.        Intake/Output Summary (Last 24 hours) at 09/03/2020 2112 Last data filed at 09/03/2020 1757 Gross per 24 hour  Intake 995 ml  Output --  Net 995 ml   There were no vitals filed for this visit.  General: Elderly, well-appearing female awake and in no distress HEENT: MM pink/moist Neuro: A and O x4 CV: s1s2 RRR, no m/r/g PULM: CTA B GI: soft, bsx4 active  Extremities: warm/dry, no edema  Skin: no rashes or lesions   Resolved Hospital Problem list     Assessment & Plan:   Acute submassive distal pulmonary emboli  Evidence of right heart strain on CT with rising troponin however patient now asymptomatic and stable -Given she is stable on room air without hypotension and no central emboli would not recommend systemic lytics -Continue heparin -Echo, LE Dopplers -Trend troponin -Please contact PCCM for any concerns or worsening clinical status     Labs   CBC: Recent Labs  Lab 09/03/20 0936  WBC 10.7*  HGB 13.6  HCT 41.1  MCV 84.2  PLT 786    Basic Metabolic Panel: Recent Labs  Lab 09/03/20 0936  NA 131*  K 4.3  CL 96*  CO2 18*  GLUCOSE 492*  BUN 28*  CREATININE 1.54*  CALCIUM 9.2   GFR: CrCl cannot be calculated (Unknown ideal weight.). Recent Labs  Lab 09/03/20 0936  WBC 10.7*    Liver Function Tests: No results for input(s): AST, ALT, ALKPHOS, BILITOT, PROT, ALBUMIN in the last 168 hours. No results for input(s): LIPASE, AMYLASE in the last 168 hours. No results for input(s): AMMONIA in the last 168 hours.  ABG No results found for: PHART, PCO2ART, PO2ART, HCO3, TCO2, ACIDBASEDEF, O2SAT   Coagulation Profile: No results for input(s): INR, PROTIME in the last 168 hours.  Cardiac  Enzymes: No results for input(s): CKTOTAL, CKMB, CKMBINDEX, TROPONINI in the last 168 hours.  HbA1C: Hgb A1c MFr Bld  Date/Time Value Ref Range Status  09/03/2020 04:18 PM 16.0 (H) 4.8 - 5.6 % Final    Comment:    (NOTE) Pre diabetes:          5.7%-6.4%  Diabetes:              >6.4%  Glycemic control for   <7.0% adults with diabetes     CBG: Recent Labs  Lab 09/03/20 0928 09/03/20 1618 09/03/20 2008  GLUCAP 494* 455* 322*    Review of Systems:   Negative except as noted in HPI  Past Medical History  She,  has a past medical history of Abnormal pap, Cataract, Colon polyps, Diabetes mellitus without complication ([place]), Hyperlipidemia, Hypertension, and Murmur.   Surgical History   History reviewed. No pertinent surgical history.   Social History   reports that she has never smoked. She has quit using smokeless tobacco.  Her smokeless tobacco use included snuff. She reports that she does not drink alcohol and does not use drugs.   Family History   Her family history includes Cancer in her brother, sister, and sister; Cervical cancer in her sister; Diabetes in her brother; Kidney failure in her father; Lung cancer in her brother; Stroke in her mother.   Allergies Allergies  Allergen Reactions  . Penicillins Swelling    Facial swelling     Home Medications  Prior to Admission medications   Medication Sig Start Date End Date Taking? Authorizing Provider  albuterol (PROVENTIL HFA;VENTOLIN HFA) 108 (90 Base) MCG/ACT inhaler INHALE 2 PUFFS INTO THE LUNGS EVERY 4 (FOUR) HOURS AS NEEDED FOR WHEEZING OR SHORTNESS OF BREATH. 02/15/19  Yes Lucio Edward, Leisa, PA-C  amLODipine (NORVASC) 10 MG tablet TAKE 1 TABLET EVERY DAY Patient taking differently: Take 10 mg by mouth daily.  06/19/20  Yes Susy Frizzle, MD  aspirin EC 81 MG tablet Take 81 mg by mouth daily. Swallow whole.   Yes [provider]  Calcium Citrate-Vitamin D (CALCIUM + D PO) Take 1 tablet by mouth daily.    Yes [provider]  Cholecalciferol (VITAMIN D3 PO) Take 1 tablet by mouth daily.   Yes [provider]  latanoprost (XALATAN) 0.005 % ophthalmic solution Place 1 drop into both eyes at bedtime.  12/01/18  Yes [provider]  losartan-hydrochlorothiazide (HYZAAR) 50-12.5 MG tablet TAKE 1 TABLET EVERY DAY Patient taking differently: Take 1 tablet by mouth daily.  01/26/20  Yes Susy Frizzle, MD  MITIGARE 0.6 MG CAPS 2 tablets immediately then repeat 1 tablet 0.6 mg in 1 hour if pain persists.  Repeat this process daily until better Patient taking differently: Take 0.6-1.2 mg by mouth See admin instructions. Take 2 tablets (1.2 mg) immediately at onset of gout flare, then repeat 1 tablet (0.6 mg) in 1 hour if pain persists.  Repeat this  process daily until better 02/02/20  Yes Pickard, Cammie Mcgee, MD  omeprazole (PRILOSEC) 40 MG capsule TAKE 1 CAPSULE EVERY DAY Patient taking differently: Take 40 mg by mouth daily.  01/26/20  Yes Susy Frizzle, MD  simvastatin (ZOCOR) 10 MG tablet TAKE 1 TABLET AT BEDTIME Patient taking differently: Take 10 mg by mouth at bedtime.  04/11/20  Yes Susy Frizzle, MD     Critical care time: 32 minutes       Otilio Carpen Stephen Turnbaugh, PA-C

## 2020-09-03 NOTE — ED Provider Notes (Signed)
[facility] EMERGENCY DEPARTMENT Provider Note   CSN: [id number] Arrival date & time: 09/03/20  3810     History Chief Complaint  Patient presents with  . Shortness of Breath  . Hyperglycemia    Gisel H Tawny Asal is a 79 y.o. female.  Patient with history of hypertension, high cholesterol, questionable history of diabetes (patient denies) --presents to the emergency department for worsening shortness of breath and also hyperglycemia.  Patient reports having progressively worsening shortness of breath, albeit mild, over the past 1 month.  Symptoms were worse with exertion.  She denies ongoing or intermittent chest pain.  No fevers or cough.  She reports that about 1 week ago she had increased thirst and urination including dry mouth.  Her shortness of breath continued to worsen to the point where it was severe last night and again this morning.  This morning's episode was after she awoke and was just getting ready in the bathroom.  No lightheadedness or syncope.  Symptoms today prompted call to EMS.  Patient's oxygen saturations were 86% on room air upon their arrival, improved with supplemental oxygen.  Here she is satting normally, at rest, off of oxygen.  Blood sugar was found to be 578.  She was tachycardic.  She was given a liter fluid bolus.  Patient has been vaccinated against Covid.  No history of heart attack.  She denies significant recent lower extremity swelling. Patient denies risk factors for pulmonary embolism including: unilateral leg swelling, history of DVT/PE/other blood clots, use of exogenous hormones, recent immobilizations, recent surgery, recent travel (>4hr segment), malignancy, hemoptysis.          Past Medical History:  Diagnosis Date  . Abnormal pap   . Cataract   . Colon polyps   . Diabetes mellitus without complication ([place])   . Hyperlipidemia   . Hypertension   . Murmur     Patient Active Problem List   Diagnosis Date Noted  .  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 Hypertension   . Hyperlipidemia     History reviewed. No pertinent surgical history.   OB History   No obstetric history on file.     Family History  Problem Relation Age of Onset  . Stroke Mother   . Kidney failure Father   . Diabetes Brother   . Cancer Brother        Lung - Smoker  . Lung cancer Brother   . Cervical cancer Sister   . Cancer Sister   . Cancer Sister        ?Cervical CA    Social History   Tobacco Use  . Smoking status: Never Smoker  . Smokeless tobacco: Former Systems developer    Types: Snuff  Substance Use Topics  . Alcohol use: No  . Drug use: No    Home Medications Prior to Admission medications   Medication Sig Start Date End Date Taking? Authorizing Provider  albuterol (PROVENTIL HFA;VENTOLIN HFA) 108 (90 Base) MCG/ACT inhaler INHALE 2 PUFFS INTO THE LUNGS EVERY 4 (FOUR) HOURS AS NEEDED FOR WHEEZING OR SHORTNESS OF BREATH. 02/15/19   Delsa Grana, PA-C  amLODipine (NORVASC) 10 MG tablet TAKE 1 TABLET EVERY DAY 06/19/20   Susy Frizzle, MD  aspirin 81 MG tablet Take 81 mg by mouth daily.    [provider]  Calcium Citrate-Vitamin D (CALCIUM + D PO) Take 1 tablet by mouth daily.    [provider]  latanoprost (XALATAN) 0.005 % ophthalmic solution  12/01/18   [provider]  losartan-hydrochlorothiazide (HYZAAR) 50-12.5 MG tablet TAKE 1 TABLET EVERY DAY 01/26/20   Susy Frizzle, MD  MITIGARE 0.6 MG CAPS 2 tablets immediately then repeat 1 tablet 0.6 mg in 1 hour if pain persists.  Repeat this process daily until better Patient not taking: Reported on 04/20/2020 02/02/20   Susy Frizzle, MD  omeprazole (PRILOSEC) 40 MG capsule TAKE 1 CAPSULE EVERY DAY 01/26/20   Susy Frizzle, MD  simvastatin (ZOCOR) 10 MG tablet TAKE 1 TABLET AT BEDTIME 04/11/20   Susy Frizzle, MD  Vitamin D, Cholecalciferol, 1000 UNITS CAPS Take 1,000 mg by mouth.    [provider]    Allergies     Penicillins  Review of Systems   Review of Systems  Constitutional: Negative for fever.  HENT: Negative for rhinorrhea and sore throat.   Eyes: Negative for redness.  Respiratory: Positive for shortness of breath. Negative for cough.   Cardiovascular: Negative for chest pain and palpitations.  Gastrointestinal: Negative for abdominal pain, diarrhea, nausea and vomiting.  Endocrine: Positive for polydipsia and polyuria.  Genitourinary: Negative for dysuria, frequency, hematuria and urgency.  Musculoskeletal: Negative for myalgias.  Skin: Negative for rash.  Neurological: Negative for headaches.    Physical Exam Updated Vital Signs BP (!) 144/92 (BP Location: Right Arm)   Pulse 100   Temp 97.8 F (36.6 C) (Oral)   Resp (!) 30   SpO2 99%   Physical Exam Vitals and nursing note reviewed.  Constitutional:      General: She is not in acute distress.    Appearance: She is well-developed.  HENT:     Head: Normocephalic and atraumatic.     Right Ear: External ear normal.     Left Ear: External ear normal.     Nose: Nose normal.  Eyes:     Conjunctiva/sclera: Conjunctivae normal.  Neck:     Vascular: No JVD.  Cardiovascular:     Rate and Rhythm: Regular rhythm. Tachycardia present.     Comments: Mild tachycardia Pulmonary:     Effort: No respiratory distress.     Breath sounds: No wheezing, rhonchi or rales.  Abdominal:     Palpations: Abdomen is soft.     Tenderness: There is no abdominal tenderness. There is no guarding or rebound.  Musculoskeletal:     Cervical back: Normal range of motion and neck supple.     Right lower leg: No tenderness. No edema.     Left lower leg: No tenderness. No edema.     Comments: No clinical signs of DVT.  Skin:    General: Skin is warm and dry.     Findings: No rash.  Neurological:     General: No focal deficit present.     Mental Status: She is alert. Mental status is at baseline.     Motor: No weakness.  Psychiatric:         Mood and Affect: Mood normal.     ED Results / Procedures / Treatments   Labs (all labs ordered are listed, but only abnormal results are displayed) Labs Reviewed  BASIC METABOLIC PANEL - Abnormal; Notable for the following components:      Result Value   Sodium 131 (*)    Chloride 96 (*)    CO2 18 (*)    Glucose, Bld 492 (*)    BUN 28 (*)    Creatinine, Ser 1.54 (*)    GFR calc non Af  Amer 32 (*)    GFR calc Af Amer 37 (*)    Anion gap 17 (*)    All other components within normal limits  CBC - Abnormal; Notable for the following components:   WBC 10.7 (*)    All other components within normal limits  URINALYSIS, ROUTINE W REFLEX MICROSCOPIC - Abnormal; Notable for the following components:   APPearance HAZY (*)    Glucose, UA >=500 (*)    Hgb urine dipstick SMALL (*)    Ketones, ur 20 (*)    Leukocytes,Ua MODERATE (*)    All other components within normal limits  BRAIN NATRIURETIC PEPTIDE - Abnormal; Notable for the following components:   B Natriuretic Peptide 195.3 (*)    All other components within normal limits  D-DIMER, QUANTITATIVE (NOT AT [facility]) - Abnormal; Notable for the following components:   D-Dimer, Quant 5.01 (*)    All other components within normal limits  HEMOGLOBIN A1C - Abnormal; Notable for the following components:   Hgb A1c MFr Bld 16.0 (*)    All other components within normal limits  CBG MONITORING, ED - Abnormal; Notable for the following components:   Glucose-Capillary 494 (*)    All other components within normal limits  CBG MONITORING, ED - Abnormal; Notable for the following components:   Glucose-Capillary 455 (*)    All other components within normal limits  TROPONIN I (HIGH SENSITIVITY) - Abnormal; Notable for the following components:   Troponin I (High Sensitivity) 65 (*)    All other components within normal limits  TROPONIN I (HIGH SENSITIVITY) - Abnormal; Notable for the following components:   Troponin I (High Sensitivity) 183 (*)     All other components within normal limits  RESPIRATORY PANEL BY RT PCR (FLU A&B, COVID)  BETA-HYDROXYBUTYRIC ACID    EKG EKG Interpretation  Date/Time:  Sunday September 03 2020 09:19:58 EDT Ventricular Rate:  114 PR Interval:  170 QRS Duration: 84 QT Interval:  334 QTC Calculation: 460 R Axis:   18 Text Interpretation: Sinus tachycardia Septal infarct , age undetermined No previous tracing Confirmed by Blanchie Dessert [phone]) on 09/03/2020 3:42:17 PM   Radiology DG Chest 2 View  Result Date: 09/03/2020 CLINICAL DATA:  Shortness of breath. EXAM: CHEST - 2 VIEW COMPARISON:  December 15, 2018. FINDINGS: The heart size and mediastinal contours are within normal limits. Both lungs are clear. No pneumothorax or pleural effusion is noted. The visualized skeletal structures are unremarkable. IMPRESSION: No active cardiopulmonary disease. Electronically Signed   By: Marijo Conception M.D.   On: 09/03/2020 09:51    Procedures Procedures (including critical care time)  Medications Ordered in ED Medications  heparin bolus via infusion 4,000 Units (has no administration in time range)  heparin ADULT infusion 100 units/mL (25000 units/244mL sodium chloride 0.45%) (has no administration in time range)  sodium chloride 0.9 % bolus 1,000 mL (0 mLs Intravenous Stopped 09/03/20 1757)  iohexol (OMNIPAQUE) 350 MG/ML injection 60 mL (60 mLs Intravenous Contrast Given 09/03/20 1735)    ED Course  I have reviewed the triage vital signs and the nursing notes.  Pertinent labs & imaging results that were available during my care of the patient were reviewed by me and considered in my medical decision making (see chart for details).  Patient seen and examined. Work-up reviewed.  Patient does have elevated blood sugar.  Minimally elevated anion gap at 17 and mildly low bicarbonate at 18.  20 ketones in urine.  Will send beta  hydroxy butyrate level, however do not suspect significant DKA at this point.   Patient had mildly elevated troponin at 60, will send second marker.  Will check D-dimer, BNP given undifferentiated shortness of breath.  Covid testing as well.  Fluid bolus ordered to treat elevated blood sugar.  Vital signs reviewed and are as follows: BP (!) 144/92 (BP Location: Right Arm)   Pulse 100   Temp 97.8 F (36.6 C) (Oral)   Resp (!) 30   SpO2 99%   5:35 PM patient with troponin 65 > 183.  BNP is slightly elevated.  D-dimer is 5.  She will need PE study.  Mild acute on chronic kidney injury.  Question NSTEMI vs PE. Heparin ordered.  6:01 PM CT shows submassive PE and right heart strain. Spoke with radiologist. Dr. Maryan Rued has seen patient.   Probable mild DKA given elevated beta-hydroxy.  6:38 PM Spoke with Dr. Posey Pronto. Requests call to PCCM for reccs. Will start insulin drip to control blood sugar.   7:14 PM Spoke with PCCM/[facility]. Says they will have ground team come and evaluate patient. Aware of hospitalist admit.     CRITICAL CARE Performed by: Carlisle Cater PA-C Total critical care time: 55 minutes Critical care time was exclusive of separately billable procedures and treating other patients. Critical care was necessary to treat or prevent imminent or life-threatening deterioration. Critical care was time spent personally by me on the following activities: development of treatment plan with patient and/or surrogate as well as nursing, discussions with consultants, evaluation of patient's response to treatment, examination of patient, obtaining history from patient or surrogate, ordering and performing treatments and interventions, ordering and review of laboratory studies, ordering and review of radiographic studies, pulse oximetry and re-evaluation of patient's condition.      MDM Rules/Calculators/A&P                          Admit.    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

## 2020-09-03 NOTE — H&P (Signed)
History and Physical    Carla Wells LDJ:[id number] DOB: [birth year] DOA: 09/03/2020  PCP: Susy Frizzle, MD  Patient coming from: Home via EMS  I have personally briefly reviewed patient's old medical records in [place]  Chief Complaint: Shortness of breath  HPI: Carla Wells is a 79 y.o. female with medical history significant for untreated type 2 diabetes, hypertension, hyperlipidemia, CKD stage III who presents to the ED for evaluation of shortness of breath.  Patient states she has been having progressive shortness of breath over the last month which occurs with minimal exertion.  Symptoms have been worsening over the last 3 days and became severe last night and today.  She has not had any chest pain, palpitations, lightheadedness/dizziness, syncope, or cough.  She does report cramping sensation in both of her calves recently.  She has not seen any significant swelling in her legs.  She also says she has had polydipsia and polyuria over the last week.  She denies any personal history of blood clots.  She says that her nephew did have a blood clot in his leg years ago.  She denies any tobacco use, estrogen use, recent surgery, or recent travel.  Of note, patient did test positive for COVID-19 in December 2020.  She did not require hospitalization.  She received an outpatient monoclonal antibody infusion with bamlanivimab on 11/23/2019.  She has since been fully vaccinated with the [place] COVID-19 vaccines, last dose administered 02/05/2020.  ED Course:  Initial vitals showed BP 135/63, pulse 115, RR 15, temp 98.1 Fahrenheit, SPO2 100% on room air.  Labs show sodium 131, potassium 4.3, bicarb 18, BUN 28, creatinine 1.54, serum glucose 492, anion gap 17, WBC 10.7, hemoglobin 13.6, platelets 155,000, high-sensitivity troponin I 65 > 183, BNP 195.3, beta hydroxy butyrate 6.36, D-dimer 5.01, A1c 16.0.  Urinalysis showed 20 ketones, negative protein, negative nitrites,  moderate leukocytes, 0-5 RBC/hpf, 21-50 WBC/hpf, no bacteria microscopy.  SARS-CoV-2 PCR is negative.  Influenza A/B PCR's are negative.  2 view chest x-ray was negative for focal consolidation, edema, or effusion.  CTA chest PE study showed pulmonary embolism with large burden of embolus present in the distal pulmonary arteries and nearly all distal lobar and segmental branch vessels.  RV/LV ratio approximately 1.6/1 concerning for right heart strain.  CAD and aortic atherosclerosis noted.  Patient was started on heparin infusion.  Patient also started on insulin infusion per DKA protocol and given IV K 10 mEq x 2.  EDP discussed with on-call PCCM who will evaluate.  The hospitalist service was consulted to admit for further evaluation and management.  Review of Systems: All systems reviewed and are negative except as documented in history of present illness above.   Past Medical History:  Diagnosis Date  . Abnormal pap   . Cataract   . Colon polyps   . Diabetes mellitus without complication ([place])   . Hyperlipidemia   . Hypertension   . Murmur     History reviewed. No pertinent surgical history.  Social History:  reports that she has never smoked. She has quit using smokeless tobacco.  Her smokeless tobacco use included snuff. She reports that she does not drink alcohol and does not use drugs.  Allergies  Allergen Reactions  . Penicillins Swelling    Facial swelling    Family History  Problem Relation Age of Onset  . Stroke Mother   . Kidney failure Father   . Diabetes Brother   . Cancer Brother  Lung - Smoker  . Lung cancer Brother   . Cervical cancer Sister   . Cancer Sister   . Cancer Sister        ?Cervical CA     Prior to Admission medications   Medication Sig Start Date End Date Taking? Authorizing Provider  albuterol (PROVENTIL HFA;VENTOLIN HFA) 108 (90 Base) MCG/ACT inhaler INHALE 2 PUFFS INTO THE LUNGS EVERY 4 (FOUR) HOURS AS NEEDED FOR WHEEZING OR  SHORTNESS OF BREATH. 02/15/19  Yes Lucio Edward, Leisa, PA-C  amLODipine (NORVASC) 10 MG tablet TAKE 1 TABLET EVERY DAY Patient taking differently: Take 10 mg by mouth daily.  06/19/20  Yes Susy Frizzle, MD  aspirin EC 81 MG tablet Take 81 mg by mouth daily. Swallow whole.   Yes [provider]  Calcium Citrate-Vitamin D (CALCIUM + D PO) Take 1 tablet by mouth daily.   Yes [provider]  Cholecalciferol (VITAMIN D3 PO) Take 1 tablet by mouth daily.   Yes [provider]  latanoprost (XALATAN) 0.005 % ophthalmic solution Place 1 drop into both eyes at bedtime.  12/01/18  Yes [provider]  losartan-hydrochlorothiazide (HYZAAR) 50-12.5 MG tablet TAKE 1 TABLET EVERY DAY Patient taking differently: Take 1 tablet by mouth daily.  01/26/20  Yes Susy Frizzle, MD  MITIGARE 0.6 MG CAPS 2 tablets immediately then repeat 1 tablet 0.6 mg in 1 hour if pain persists.  Repeat this process daily until better Patient taking differently: Take 0.6-1.2 mg by mouth See admin instructions. Take 2 tablets (1.2 mg) immediately at onset of gout flare, then repeat 1 tablet (0.6 mg) in 1 hour if pain persists.  Repeat this process daily until better 02/02/20  Yes Pickard, Cammie Mcgee, MD  omeprazole (PRILOSEC) 40 MG capsule TAKE 1 CAPSULE EVERY DAY Patient taking differently: Take 40 mg by mouth daily.  01/26/20  Yes Susy Frizzle, MD  simvastatin (ZOCOR) 10 MG tablet TAKE 1 TABLET AT BEDTIME Patient taking differently: Take 10 mg by mouth at bedtime.  04/11/20  Yes Susy Frizzle, MD    Physical Exam: Vitals:   09/03/20 1930 09/03/20 1945 09/03/20 2000 09/03/20 2015  BP: 129/72 122/69 (!) 161/79 125/61  Pulse: (!) 105 (!) 102 (!) 118 (!) 102  Resp: (!) 27 (!) 23 (!) 22 (!) 24  Temp:      TempSrc:      SpO2: 97% 98% 97% 97%   Constitutional: Obese woman resting supine in bed, NAD, calm, comfortable Eyes: PERRL, lids and conjunctivae normal ENMT: Mucous membranes are moist.  Posterior pharynx clear of any exudate or lesions.Normal dentition.  Neck: normal, supple, no masses. Respiratory: clear to auscultation bilaterally, no wheezing, no crackles. Normal respiratory effort. No accessory muscle use.  Cardiovascular: Regular rate and rhythm, no murmurs / rubs / gallops. No extremity edema. 2+ pedal pulses. Abdomen: no tenderness, no masses palpated. No hepatosplenomegaly. Bowel sounds positive.  Musculoskeletal: no clubbing / cyanosis. No joint deformity upper and lower extremities. Good ROM, no contractures. Normal muscle tone.  Skin: no rashes, lesions, ulcers. No induration Neurologic: CN 2-12 grossly intact. Sensation intact, Strength 5/5 in all 4.  Psychiatric: Normal judgment and insight. Alert and oriented x 3. Normal mood.   Labs on Admission: I have personally reviewed following labs and imaging studies  CBC: Recent Labs  Lab 09/03/20 0936  WBC 10.7*  HGB 13.6  HCT 41.1  MCV 84.2  PLT 283   Basic Metabolic Panel: Recent Labs  Lab 09/03/20 0936  NA 131*  K 4.3  CL 96*  CO2 18*  GLUCOSE 492*  BUN 28*  CREATININE 1.54*  CALCIUM 9.2   GFR: CrCl cannot be calculated (Unknown ideal weight.). Liver Function Tests: No results for input(s): AST, ALT, ALKPHOS, BILITOT, PROT, ALBUMIN in the last 168 hours. No results for input(s): LIPASE, AMYLASE in the last 168 hours. No results for input(s): AMMONIA in the last 168 hours. Coagulation Profile: No results for input(s): INR, PROTIME in the last 168 hours. Cardiac Enzymes: No results for input(s): CKTOTAL, CKMB, CKMBINDEX, TROPONINI in the last 168 hours. BNP (last 3 results) No results for input(s): PROBNP in the last 8760 hours. HbA1C: Recent Labs    09/03/20 1618  HGBA1C 16.0*   CBG: Recent Labs  Lab 09/03/20 0928 09/03/20 1618 09/03/20 2008  GLUCAP 494* 455* 322*   Lipid Profile: No results for input(s): CHOL, HDL, LDLCALC, TRIG, CHOLHDL, LDLDIRECT in the last 72  hours. Thyroid Function Tests: No results for input(s): TSH, T4TOTAL, FREET4, T3FREE, THYROIDAB in the last 72 hours. Anemia Panel: No results for input(s): VITAMINB12, FOLATE, FERRITIN, TIBC, IRON, RETICCTPCT in the last 72 hours. Urine analysis:    Component Value Date/Time   COLORURINE YELLOW 09/03/2020 1009   APPEARANCEUR HAZY (A) 09/03/2020 1009   LABSPEC 1.028 09/03/2020 1009   PHURINE 5.0 09/03/2020 1009   GLUCOSEU >=500 (A) 09/03/2020 1009   HGBUR SMALL (A) 09/03/2020 1009   BILIRUBINUR NEGATIVE 09/03/2020 1009   KETONESUR 20 (A) 09/03/2020 1009   PROTEINUR NEGATIVE 09/03/2020 1009   NITRITE NEGATIVE 09/03/2020 1009   LEUKOCYTESUR MODERATE (A) 09/03/2020 1009    Radiological Exams on Admission: DG Chest 2 View  Result Date: 09/03/2020 CLINICAL DATA:  Shortness of breath. EXAM: CHEST - 2 VIEW COMPARISON:  December 15, 2018. FINDINGS: The heart size and mediastinal contours are within normal limits. Both lungs are clear. No pneumothorax or pleural effusion is noted. The visualized skeletal structures are unremarkable. IMPRESSION: No active cardiopulmonary disease. Electronically Signed   By: Marijo Conception M.D.   On: 09/03/2020 09:51   CT Angio Chest PE W and/or Wo Contrast  Result Date: 09/03/2020 CLINICAL DATA:  PE suspected, shortness of breath for 1 month EXAM: CT ANGIOGRAPHY CHEST WITH CONTRAST TECHNIQUE: Multidetector CT imaging of the chest was performed using the standard protocol during bolus administration of intravenous contrast. Multiplanar CT image reconstructions and MIPs were obtained to evaluate the vascular anatomy. CONTRAST:  15mL OMNIPAQUE IOHEXOL 350 MG/ML SOLN COMPARISON:  None. FINDINGS: Cardiovascular: Satisfactory opacification of the pulmonary arteries to the segmental level. Positive examination for pulmonary embolism with a large burden of embolus present in the distal pulmonary arteries and nearly all distal lobar and segmental branch vessels. Enlargement  of the RV LV ratio approximately 1.6-1. The main pulmonary artery is normal in caliber measuring 2.7 cm. Normal heart size. No pericardial effusion. Three-vessel coronary artery calcifications. Aortic atherosclerosis. Mediastinum/Nodes: No enlarged mediastinal, hilar, or axillary lymph nodes. Small hiatal hernia. Thyroid gland, trachea, and esophagus demonstrate no significant findings. Lungs/Pleura: Lungs are clear. No pleural effusion or pneumothorax. Upper Abdomen: No acute abnormality. Musculoskeletal: No chest wall abnormality. No acute or significant osseous findings. Review of the MIP images confirms the above findings. IMPRESSION: 1. Positive examination for pulmonary embolism with a large burden of embolus present in the distal pulmonary arteries and nearly all distal lobar and segmental branch vessels. 2. Enlargement of the RV LV ratio approximately 1.6-1, concerning for right heart strain. The main pulmonary artery  is normal in caliber measuring 2.7 cm. 3. Coronary artery disease.  Aortic Atherosclerosis (ICD10-I70.0). 4. Small hiatal hernia. These results were called by telephone at the time of interpretation on 09/03/2020 at 5:58 pm to PA [facility] , who verbally acknowledged these results. Electronically Signed   By: Eddie Candle M.D.   On: 09/03/2020 18:00    EKG: Independently reviewed. Sinus tachycardia, rate is faster when compared to prior.  Assessment/Plan Principal Problem:   Acute pulmonary embolus (HCC) Active Problems:   Hypertension associated with diabetes ([place])   Diabetic ketoacidosis without coma associated with type 2 diabetes mellitus ([place])   Acute kidney injury superimposed on CKD ([place])   Hyperlipidemia associated with type 2 diabetes mellitus ([place])  Carla Wells is a 79 y.o. female with medical history significant for untreated type 2 diabetes, hypertension, hyperlipidemia, CKD stage III who is admitted with acute pulmonary embolism.  Acute pulmonary embolism:   Large clot burden seen on CT imaging with concern for right heart strain.  No obvious provoking factors.  She is otherwise hemodynamically stable and saturating well on room air. -Continue IV heparin anticoagulation -Obtain echocardiogram and lower extremity Dopplers -PCCM to evaluate, further recommendations appreciated  DKA in setting of untreated type 2 diabetes: Patient presenting with DKA in setting of undiagnosed type 2 diabetes.  A1c is 16.0.  She has been started on insulin infusion. -Continue insulin infusion per DKA protocol -Continue IV fluids as ordered and transition to D5 in LR when CBG <250 -Follow BMET and transition to subq insulin when gap closes  Elevated troponin: Troponin trend 65 > 183.  Likely demand from PE.  We will continue to cycle cardiac enzymes, obtain echocardiogram as above.  Continue IV heparin.  AKI on CKD stage III: Likely due to volume depletion in setting of DKA.  Continue management as above and hold home losartan-HCTZ for now.  Recheck labs in a.m.  Hypertension: Currently normotensive, hold home antihypertensives for now.  Hyperlipidemia: Continue simvastatin.  DVT prophylaxis: Heparin anticoagulation Code Status: Full code, confirmed with patient Family Communication: Discussed with patient, she has discussed with family Disposition Plan: From home and likely discharge to home pending further PE work-up and management.  Likely requires at least 48 hours of IV anticoagulation. Consults called: PCCM Admission status:  Status is: Inpatient  Remains inpatient appropriate because:IV treatments appropriate due to intensity of illness or inability to take PO and Inpatient level of care appropriate due to severity of illness   Dispo: The patient is from: Home              Anticipated d/c is to: Home              Anticipated d/c date is: 3 days              Patient currently is not medically stable to d/c.   Zada Finders MD [facility]  If 7PM-7AM, please contact night-coverage [email or web address]  09/03/2020, 9:18 PM

## 2020-09-03 NOTE — ED Triage Notes (Addendum)
Pt to triage via [facility] from home.  Reports SOB x 1 month.  Pt is scheduled to see PCP this week but states she couldn't wait.  On fire dept arrival sats were 86% on room air.  Pt placed on 2 liters [place] and sats up to 100%.  Clear lung sounds. HR 136.  CBG 578.  Denies history of diabetes. EMS administered 1 liter bolus.  18g L AC.  Denies pain.  Pt had COVID earlier this year and has vaccines.

## 2020-09-04 ENCOUNTER — Inpatient Hospital Stay (HOSPITAL_COMMUNITY): Payer: Medicare HMO

## 2020-09-04 DIAGNOSIS — R739 Hyperglycemia, unspecified: Secondary | ICD-10-CM

## 2020-09-04 DIAGNOSIS — I2699 Other pulmonary embolism without acute cor pulmonale: Secondary | ICD-10-CM

## 2020-09-04 DIAGNOSIS — E785 Hyperlipidemia, unspecified: Secondary | ICD-10-CM

## 2020-09-04 DIAGNOSIS — I2602 Saddle embolus of pulmonary artery with acute cor pulmonale: Secondary | ICD-10-CM

## 2020-09-04 DIAGNOSIS — E1110 Type 2 diabetes mellitus with ketoacidosis without coma: Secondary | ICD-10-CM

## 2020-09-04 DIAGNOSIS — N189 Chronic kidney disease, unspecified: Secondary | ICD-10-CM

## 2020-09-04 DIAGNOSIS — E1169 Type 2 diabetes mellitus with other specified complication: Secondary | ICD-10-CM

## 2020-09-04 DIAGNOSIS — N179 Acute kidney failure, unspecified: Secondary | ICD-10-CM

## 2020-09-04 LAB — CBC
HCT: 37.9 % (ref 36.0–46.0)
Hemoglobin: 12.5 g/dL (ref 12.0–15.0)
MCH: 27.2 pg (ref 26.0–34.0)
MCHC: 33 g/dL (ref 30.0–36.0)
MCV: 82.4 fL (ref 80.0–100.0)
Platelets: 159 10*3/uL (ref 150–400)
RBC: 4.6 MIL/uL (ref 3.87–5.11)
RDW: 13.8 % (ref 11.5–15.5)
WBC: 9 10*3/uL (ref 4.0–10.5)
nRBC: 0 % (ref 0.0–0.2)

## 2020-09-04 LAB — BASIC METABOLIC PANEL
Anion gap: 12 (ref 5–15)
Anion gap: 14 (ref 5–15)
Anion gap: 8 (ref 5–15)
Anion gap: 8 (ref 5–15)
Anion gap: 9 (ref 5–15)
BUN: 17 mg/dL (ref 8–23)
BUN: 18 mg/dL (ref 8–23)
BUN: 19 mg/dL (ref 8–23)
BUN: 21 mg/dL (ref 8–23)
BUN: 22 mg/dL (ref 8–23)
CO2: 18 mmol/L — ABNORMAL LOW (ref 22–32)
CO2: 18 mmol/L — ABNORMAL LOW (ref 22–32)
CO2: 20 mmol/L — ABNORMAL LOW (ref 22–32)
CO2: 22 mmol/L (ref 22–32)
CO2: 24 mmol/L (ref 22–32)
Calcium: 8.9 mg/dL (ref 8.9–10.3)
Calcium: 8.9 mg/dL (ref 8.9–10.3)
Calcium: 8.9 mg/dL (ref 8.9–10.3)
Calcium: 8.9 mg/dL (ref 8.9–10.3)
Calcium: 9.2 mg/dL (ref 8.9–10.3)
Chloride: 106 mmol/L (ref 98–111)
Chloride: 106 mmol/L (ref 98–111)
Chloride: 107 mmol/L (ref 98–111)
Chloride: 107 mmol/L (ref 98–111)
Chloride: 109 mmol/L (ref 98–111)
Creatinine, Ser: 0.95 mg/dL (ref 0.44–1.00)
Creatinine, Ser: 0.99 mg/dL (ref 0.44–1.00)
Creatinine, Ser: 1.02 mg/dL — ABNORMAL HIGH (ref 0.44–1.00)
Creatinine, Ser: 1.07 mg/dL — ABNORMAL HIGH (ref 0.44–1.00)
Creatinine, Ser: 1.18 mg/dL — ABNORMAL HIGH (ref 0.44–1.00)
GFR calc Af Amer: 51 mL/min — ABNORMAL LOW (ref >60)
GFR calc Af Amer: 57 mL/min — ABNORMAL LOW (ref >60)
GFR calc Af Amer: >60 mL/min (ref >60)
GFR calc Af Amer: >60 mL/min (ref >60)
GFR calc Af Amer: >60 mL/min (ref >60)
GFR calc non Af Amer: 44 mL/min — ABNORMAL LOW (ref >60)
GFR calc non Af Amer: 49 mL/min — ABNORMAL LOW (ref >60)
GFR calc non Af Amer: 52 mL/min — ABNORMAL LOW (ref >60)
GFR calc non Af Amer: 54 mL/min — ABNORMAL LOW (ref >60)
GFR calc non Af Amer: 57 mL/min — ABNORMAL LOW (ref >60)
Glucose, Bld: 129 mg/dL — ABNORMAL HIGH (ref 70–99)
Glucose, Bld: 133 mg/dL — ABNORMAL HIGH (ref 70–99)
Glucose, Bld: 133 mg/dL — ABNORMAL HIGH (ref 70–99)
Glucose, Bld: 135 mg/dL — ABNORMAL HIGH (ref 70–99)
Glucose, Bld: 162 mg/dL — ABNORMAL HIGH (ref 70–99)
Potassium: 3.6 mmol/L (ref 3.5–5.1)
Potassium: 3.6 mmol/L (ref 3.5–5.1)
Potassium: 3.7 mmol/L (ref 3.5–5.1)
Potassium: 3.9 mmol/L (ref 3.5–5.1)
Potassium: 4 mmol/L (ref 3.5–5.1)
Sodium: 136 mmol/L (ref 135–145)
Sodium: 137 mmol/L (ref 135–145)
Sodium: 138 mmol/L (ref 135–145)
Sodium: 138 mmol/L (ref 135–145)
Sodium: 139 mmol/L (ref 135–145)

## 2020-09-04 LAB — CBG MONITORING, ED
Glucose-Capillary: 119 mg/dL — ABNORMAL HIGH (ref 70–99)
Glucose-Capillary: 121 mg/dL — ABNORMAL HIGH (ref 70–99)
Glucose-Capillary: 127 mg/dL — ABNORMAL HIGH (ref 70–99)
Glucose-Capillary: 128 mg/dL — ABNORMAL HIGH (ref 70–99)
Glucose-Capillary: 144 mg/dL — ABNORMAL HIGH (ref 70–99)
Glucose-Capillary: 147 mg/dL — ABNORMAL HIGH (ref 70–99)
Glucose-Capillary: 151 mg/dL — ABNORMAL HIGH (ref 70–99)
Glucose-Capillary: 153 mg/dL — ABNORMAL HIGH (ref 70–99)
Glucose-Capillary: 160 mg/dL — ABNORMAL HIGH (ref 70–99)
Glucose-Capillary: 175 mg/dL — ABNORMAL HIGH (ref 70–99)
Glucose-Capillary: 303 mg/dL — ABNORMAL HIGH (ref 70–99)

## 2020-09-04 LAB — TROPONIN I (HIGH SENSITIVITY)
Troponin I (High Sensitivity): 216 ng/L (ref <18)
Troponin I (High Sensitivity): 222 ng/L (ref <18)

## 2020-09-04 LAB — ECHOCARDIOGRAM COMPLETE: Area-P 1/2: 3.21 cm2

## 2020-09-04 LAB — GLUCOSE, CAPILLARY: Glucose-Capillary: 217 mg/dL — ABNORMAL HIGH (ref 70–99)

## 2020-09-04 LAB — HEPARIN LEVEL (UNFRACTIONATED)
Heparin Unfractionated: 1.01 IU/mL — ABNORMAL HIGH (ref 0.30–0.70)
Heparin Unfractionated: 0.71 IU/mL — ABNORMAL HIGH (ref 0.30–0.70)
Heparin Unfractionated: 0.62 IU/mL (ref 0.30–0.70)

## 2020-09-04 MED ORDER — INSULIN GLARGINE 100 UNIT/ML ~~LOC~~ SOLN: 20.0000 [IU] | Freq: Every day | SUBCUTANEOUS | Status: DC

## 2020-09-04 MED ORDER — INSULIN ASPART 100 UNIT/ML ~~LOC~~ SOLN
0.0000 [IU] | Freq: Three times a day (TID) | SUBCUTANEOUS | Status: DC
  Administered 2020-09-04: 11 [IU] via SUBCUTANEOUS
  Administered 2020-09-05: 15 [IU] via SUBCUTANEOUS
  Administered 2020-09-05: 11 [IU] via SUBCUTANEOUS
  Administered 2020-09-05 – 2020-09-06 (×2): 3 [IU] via SUBCUTANEOUS
  Administered 2020-09-06: 5 [IU] via SUBCUTANEOUS

## 2020-09-04 MED ORDER — INSULIN ASPART 100 UNIT/ML ~~LOC~~ SOLN
0.0000 [IU] | Freq: Every day | SUBCUTANEOUS | Status: DC
  Administered 2020-09-04: 2 [IU] via SUBCUTANEOUS

## 2020-09-04 MED ORDER — PERFLUTREN LIPID MICROSPHERE
1.0000 mL | INTRAVENOUS | Status: AC | PRN
  Administered 2020-09-04: 1 mL via INTRAVENOUS
  Filled 2020-09-04: qty 10

## 2020-09-04 MED ORDER — INSULIN GLARGINE 100 UNIT/ML ~~LOC~~ SOLN
18.0000 [IU] | Freq: Every day | SUBCUTANEOUS | Status: DC
  Administered 2020-09-04 – 2020-09-05 (×2): 18 [IU] via SUBCUTANEOUS
  Filled 2020-09-04 (×3): qty 0.18

## 2020-09-04 NOTE — Progress Notes (Signed)
ANTICOAGULATION CONSULT NOTE - Follow Up Consult  Pharmacy Consult for heparin Indication: pulmonary embolus  Labs: Recent Labs    09/03/20 0936 09/03/20 1618 09/03/20 2207 09/03/20 2337 09/04/20 0030  HGB 13.6  --  14.3  --   --   HCT 41.1  --  42.0  --   --   PLT 155  --   --   --   --   HEPARINUNFRC  --   --   --   --  1.01*  CREATININE 1.54*  --   --  1.18*  --   TROPONINIHS 65* 183*  --  222*  --     Assessment: 79yo female supratherapeutic on heparin with initial dosing for PE; no gtt issues or signs of bleeding per RN.  Goal of Therapy:  Heparin level 0.3-0.7 units/ml   Plan:  Will decrease heparin gtt by 2-3 units/kg/hr to 1000 units/hr and check level in 8 hours.    Wynona Neat, PharmD, BCPS  09/04/2020,2:37 AM

## 2020-09-04 NOTE — Progress Notes (Signed)
Bilateral lower extremity venous duplex completed. Refer to "CV Proc" under chart review to view preliminary results.  09/04/2020 3:25 PM Kelby Aline., MHA, RVT, RDCS, RDMS

## 2020-09-04 NOTE — Progress Notes (Signed)
ANTICOAGULATION CONSULT NOTE - Follow Up Consult  Pharmacy Consult for heparin Indication: pulmonary embolus  Labs: Recent Labs    09/03/20 0936 09/03/20 0936 09/03/20 1618 09/03/20 2207 09/03/20 2337 09/04/20 0030 09/04/20 0358 09/04/20 0912  HGB 13.6   < >  --  14.3  --   --  12.5  --   HCT 41.1  --   --  42.0  --   --  37.9  --   PLT 155  --   --   --   --   --  159  --   HEPARINUNFRC  --   --   --   --   --  1.01*  --  0.71*  CREATININE 1.54*   < >  --   --  1.18* 1.07* 0.99  --   TROPONINIHS 65*   < > 183*  --  222* 216*  --   --    < > = values in this interval not displayed.    Assessment: 79yo female continues on IV heparin. Heparin level is very slightly above goal at 0.71 after rate reduction. CBC is WNL and no bleeding noted.   Goal of Therapy:  Heparin level 0.3-0.7 units/ml   Plan:  Continue heparin gtt 1000 units/hr Recheck a heparin level later today to ensure therapeutic Daily heparin level and CBC  Salome Arnt, PharmD, BCPS Clinical Pharmacist Please see AMION for all pharmacy numbers 09/04/2020 10:03 AM

## 2020-09-04 NOTE — ED Notes (Signed)
Attempted to call report to 2W x3.

## 2020-09-04 NOTE — ED Notes (Signed)
Taken for ultrasound.

## 2020-09-04 NOTE — Progress Notes (Signed)
Echocardiogram 2D Echocardiogram has been performed.  Carla Wells Carla Wells 09/04/2020, 3:12 PM

## 2020-09-04 NOTE — ED Notes (Signed)
Attempted to call report to 2W 

## 2020-09-04 NOTE — Progress Notes (Signed)
PROGRESS NOTE  Carla Wells BUL:[id number] DOB: [birth year] DOA: 09/03/2020 PCP: Susy Frizzle, MD   LOS: 1 day   Brief Narrative / Interim history: 79 year old female with HTN, HLD, CKD 3A who came into the hospital for shortness of breath.  This has been going on for the past month but worse in the last few days to the point that she came to the emergency room.  A CT angiogram on admission showed PEs.  She was also found to be in DKA, in the setting of new onset diabetes mellitus  Subjective / 24h Interval events: She is doing well this morning, denies any chest pain, no palpitations.  She is not feeling short of breath at rest.  Assessment & Plan: Principal Problem Acute pulmonary embolism-large clot burden seen on CT scan with concern for right heart strain.  No obvious provoking factors will stop PCCM evaluated patient but did not recommend lysis.  2D echo has been ordered and is pending.  Continue IV heparin, transition to NOAC by tomorrow if there is no evidence of bleeding and her CBC is stable  Active Problems DKA, Type 2 diabetes mellitus-patient denies history of diabetes mellitus.  She was started on insulin infusion, continue this morning, gap closed and I am hopeful that she can come off of the drip within the next few hours.  I will order Lantus, when drip comes off she will be placed on sliding scale and along with diet  Elevated troponin-no chest pain, this is likely demand ischemia due to large PE.  Trending down.  Acute kidney injury chronic kidney disease stage IIIa-Baseline creatinine around 1.1-1.2, up to 1.5 on admission, improved with fluids  Essential hypertension-currently normotensive  Hyperlipidemia-continue statin   Scheduled Meds: . insulin glargine  18 Units Subcutaneous Daily  . simvastatin  10 mg Oral QHS   Continuous Infusions: . dextrose 5% lactated ringers 1,000 mL (09/03/20 2131)  . heparin 1,000 Units/hr (09/04/20 0249)  . insulin 1.8  Units/hr (09/04/20 0832)  . lactated ringers Stopped (09/03/20 2131)   PRN Meds:.dextrose  Diet Orders (From admission, onward)    Start     Ordered   09/03/20 2113  Diet NPO time specified Except for: Ice Chips, Sips with Meds  Diet effective now       Question Answer Comment  Except for Ice Chips   Except for Sips with Meds      09/03/20 2112          DVT prophylaxis:      Code Status: Full Code  Family Communication: d/w patient   Status is: Inpatient  Remains inpatient appropriate because:IV treatments appropriate due to intensity of illness or inability to take PO and Inpatient level of care appropriate due to severity of illness   Dispo: The patient is from: Home              Anticipated d/c is to: Home              Anticipated d/c date is: 2 days              Patient currently is not medically stable to d/c.  Consultants:  PCCM  Procedures:  2D echo: pending  Microbiology  None   Antimicrobials: None     Objective: Vitals:   09/04/20 0915 09/04/20 0930 09/04/20 0945 09/04/20 1000  BP: (!) 147/76 (!) 150/76 128/79 135/79  Pulse: 94 94 91 88  Resp: 14 (!) 23 (!) 27 14  Temp:  TempSrc:      SpO2: 98% 99% 99% 97%    Intake/Output Summary (Last 24 hours) at 09/04/2020 1020 Last data filed at 09/03/2020 1757 Gross per 24 hour  Intake 995 ml  Output --  Net 995 ml   There were no vitals filed for this visit.  Examination:  Constitutional: NAD Eyes: no scleral icterus ENMT: Mucous membranes are moist.  Neck: normal, supple Respiratory: clear to auscultation bilaterally, no wheezing, no crackles. Cardiovascular: Regular rate and rhythm, no murmurs / rubs / gallops. No LE edema.  Abdomen: non distended, no tenderness. Bowel sounds positive.  Musculoskeletal: no clubbing / cyanosis.  Skin: no rashes Neurologic: CN 2-12 grossly intact. Strength 5/5 in all 4.  Psychiatric: Normal judgment and insight. Alert and oriented x 3. Normal mood.     Data Reviewed: I have independently reviewed following labs and imaging studies   CBC: Recent Labs  Lab 09/03/20 0936 09/03/20 2207 09/04/20 0358  WBC 10.7*  --  9.0  HGB 13.6 14.3 12.5  HCT 41.1 42.0 37.9  MCV 84.2  --  82.4  PLT 155  --  295   Basic Metabolic Panel: Recent Labs  Lab 09/03/20 0936 09/03/20 0936 09/03/20 2207 09/03/20 2337 09/04/20 0030 09/04/20 0358 09/04/20 0909  NA 131*   < > 136 138 137 138 136  K 4.3   < > 4.8 4.0 3.6 3.7 3.6  CL 96*  --   --  106 107 109 106  CO2 18*  --   --  18* 18* 20* 22  GLUCOSE 492*  --   --  133* 135* 133* 162*  BUN 28*  --   --  22 21 19 18   CREATININE 1.54*  --   --  1.18* 1.07* 0.99 1.02*  CALCIUM 9.2  --   --  9.2 8.9 8.9 8.9   < > = values in this interval not displayed.   Liver Function Tests: No results for input(s): AST, ALT, ALKPHOS, BILITOT, PROT, ALBUMIN in the last 168 hours. Coagulation Profile: No results for input(s): INR, PROTIME in the last 168 hours. HbA1C: Recent Labs    09/03/20 1618  HGBA1C 16.0*   CBG: Recent Labs  Lab 09/04/20 0244 09/04/20 0458 09/04/20 0613 09/04/20 0730 09/04/20 0829  GLUCAP 144* 119* 175* 147* 153*    Recent Results (from the past 240 hour(s))  Respiratory Panel by RT PCR (Flu A&B, Covid) - Nasopharyngeal Swab     Status: None   Collection Time: 09/03/20  4:23 PM   Specimen: Nasopharyngeal Swab  Result Value Ref Range Status   SARS Coronavirus 2 by RT PCR NEGATIVE NEGATIVE Final    Comment: (NOTE) SARS-CoV-2 target nucleic acids are NOT DETECTED.  The SARS-CoV-2 RNA is generally detectable in upper respiratoy specimens during the acute phase of infection. The lowest concentration of SARS-CoV-2 viral copies this assay can detect is 131 copies/mL. A negative result does not preclude SARS-Cov-2 infection and should not be used as the sole basis for treatment or other patient management decisions. A negative result may occur with  improper specimen  collection/handling, submission of specimen other than nasopharyngeal swab, presence of viral mutation(s) within the areas targeted by this assay, and inadequate number of viral copies (<131 copies/mL). A negative result must be combined with clinical observations, patient history, and epidemiological information. The expected result is Negative.  Fact Sheet for Patients:  PinkCheek.be  Fact Sheet for Healthcare Providers:  GravelBags.it  This test is no  t yet approved or cleared by the [place] and  has been authorized for detection and/or diagnosis of SARS-CoV-2 by FDA under an Emergency Use Authorization (EUA). This EUA will remain  in effect (meaning this test can be used) for the duration of the COVID-19 declaration under Section 564(b)(1) of the Act, 21 U.S.C. section 360bbb-3(b)(1), unless the authorization is terminated or revoked sooner.     Influenza A by PCR NEGATIVE NEGATIVE Final   Influenza B by PCR NEGATIVE NEGATIVE Final    Comment: (NOTE) The Xpert Xpress SARS-CoV-2/FLU/RSV assay is intended as an aid in  the diagnosis of influenza from Nasopharyngeal swab specimens and  should not be used as a sole basis for treatment. Nasal washings and  aspirates are unacceptable for Xpert Xpress SARS-CoV-2/FLU/RSV  testing.  Fact Sheet for Patients: PinkCheek.be  Fact Sheet for Healthcare Providers: GravelBags.it  This test is not yet approved or cleared by the [place] FDA and  has been authorized for detection and/or diagnosis of SARS-CoV-2 by  FDA under an Emergency Use Authorization (EUA). This EUA will remain  in effect (meaning this test can be used) for the duration of the  Covid-19 declaration under Section 564(b)(1) of the Act, 21  U.S.C. section 360bbb-3(b)(1), unless the authorization is  terminated or revoked. Performed at [facility], [place] [address] [place], [place] [address]      Radiology Studies: CT Angio Chest PE W and/or Wo Contrast  Result Date: 09/03/2020 CLINICAL DATA:  PE suspected, shortness of breath for 1 month EXAM: CT ANGIOGRAPHY CHEST WITH CONTRAST TECHNIQUE: Multidetector CT imaging of the chest was performed using the standard protocol during bolus administration of intravenous contrast. Multiplanar CT image reconstructions and MIPs were obtained to evaluate the vascular anatomy. CONTRAST:  28mL OMNIPAQUE IOHEXOL 350 MG/ML SOLN COMPARISON:  None. FINDINGS: Cardiovascular: Satisfactory opacification of the pulmonary arteries to the segmental level. Positive examination for pulmonary embolism with a large burden of embolus present in the distal pulmonary arteries and nearly all distal lobar and segmental branch vessels. Enlargement of the RV LV ratio approximately 1.6-1. The main pulmonary artery is normal in caliber measuring 2.7 cm. Normal heart size. No pericardial effusion. Three-vessel coronary artery calcifications. Aortic atherosclerosis. Mediastinum/Nodes: No enlarged mediastinal, hilar, or axillary lymph nodes. Small hiatal hernia. Thyroid gland, trachea, and esophagus demonstrate no significant findings. Lungs/Pleura: Lungs are clear. No pleural effusion or pneumothorax. Upper Abdomen: No acute abnormality. Musculoskeletal: No chest wall abnormality. No acute or significant osseous findings. Review of the MIP images confirms the above findings. IMPRESSION: 1. Positive examination for pulmonary embolism with a large burden of embolus present in the distal pulmonary arteries and nearly all distal lobar and segmental branch vessels. 2. Enlargement of the RV LV ratio approximately 1.6-1, concerning for right heart strain. The main pulmonary artery is normal in caliber measuring 2.7 cm. 3. Coronary artery disease.  Aortic Atherosclerosis (ICD10-I70.0). 4. Small hiatal hernia. These results were called by  telephone at the time of interpretation on 09/03/2020 at 5:58 pm to PA [facility] , who verbally acknowledged these results. Electronically Signed   By: Eddie Candle M.D.   On: 09/03/2020 18:00    Marzetta Board, MD, PhD [facility]  Between 7 am - 7 pm I am available, please contact me via Amion or Securechat  Between 7 pm - 7 am I am not available, please contact night coverage MD/APP via Amion

## 2020-09-04 NOTE — Progress Notes (Signed)
ANTICOAGULATION CONSULT NOTE  Pharmacy Consult for heparin Indication: pulmonary embolus  Labs: Recent Labs    09/03/20 0936 09/03/20 0936 09/03/20 1618 09/03/20 2207 09/03/20 2337 09/03/20 2337 09/04/20 0030 09/04/20 0030 09/04/20 0358 09/04/20 0909 09/04/20 0912 09/04/20 1258 09/04/20 1500  HGB 13.6   < >  --  14.3  --   --   --   --  12.5  --   --   --   --   HCT 41.1  --   --  42.0  --   --   --   --  37.9  --   --   --   --   PLT 155  --   --   --   --   --   --   --  159  --   --   --   --   HEPARINUNFRC  --   --   --   --   --   --  1.01*  --   --   --  0.71*  --  0.62  CREATININE 1.54*   < >  --   --  1.18*   < > 1.07*   < > 0.99 1.02*  --  0.95  --   TROPONINIHS 65*   < > 183*  --  222*  --  216*  --   --   --   --   --   --    < > = values in this interval not displayed.    Assessment: 79yo female continues on IV heparin for acute PE with large burden of embolus, concern for RHS on CT.  Doppler negative for DVT.  Confirmatory heparin level is therapeutic; no bleeding reported.  Goal of Therapy:  Heparin level 0.3-0.7 units/ml   Plan:  Continue heparin gtt at 1000 units/hr F/U AM labs  Jasenia Weilbacher D. Mina Marble, PharmD, BCPS, [place] 09/04/2020, 4:11 PM

## 2020-09-04 NOTE — Progress Notes (Addendum)
Inpatient Diabetes Program Recommendations  AACE/ADA: New Consensus Statement on Inpatient Glycemic Control (2015)  Target Ranges:  Prepandial:   less than 140 mg/dL      Peak postprandial:   less than 180 mg/dL (1-2 hours)      Critically ill patients:  140 - 180 mg/dL   Results for Carla Wells, Carla Wells (MRN [id number]) as of 09/04/2020 07:06  Ref. Range 09/03/2020 09:28 09/03/2020 16:18 09/03/2020 20:08 09/03/2020 21:25 09/03/2020 22:41 09/04/2020 00:08 09/04/2020 01:34 09/04/2020 02:44 09/04/2020 04:58 09/04/2020 06:13    Glucose-Capillary Latest Ref Range: 70 - 99 mg/dL 494 (H) 455 (H) 322 (H)  IV Insulin Drip Started 207 (H)  IV Insulin Drip 131 (H)  IV Insulin Drip  121 (H)  IV Insulin Drip 127 (H)  IV Insulin Drip 144 (H)  IV Insulin Drip 119 (H)  IV Insulin Drip 175 (H)  IV Insulin Drip   Results for Carla Wells, Carla Wells (MRN [id number]) as of 09/04/2020 07:06  Ref. Range 09/03/2020 16:18  Hemoglobin A1C Latest Ref Range: 4.8 - 5.6 % 16.0 (H)  (412 mg/dl)    Admit with: Acute pulmonary embolism/ DKA in setting of untreated type 2 diabetes  New Diagnosis of Diabetes  Current Orders: IV Insulin Drip   PCP listed as Dr. Jenna Luo with [facility]--last seen in office 04/20/2020    MD- Note 4am BMET shows the following: Glucose 133 Anion Gap 9 CO2 level 20  When you allow pt to transition to SQ Insulin, please consider the following:  1. Start Levemir 23 units Daily (0.25 units/kg)--Make sure pt receives 1st dose at least 1-2 hours prior to d/c of the IV Insulin Drip  2. Start Novolog Moderate Correction Scale/ SSI (0-15 units) TID AC + HS Q4 hours if to remain NPO   Given A1c is 16%, pt will need insulin for home.  The Diabetes Coordinator RN will speak with pt when appropriate and order educational materials for pt since this is a new diagnosis per MD notes.    Addendum 12:30pm--Met w/ pt down in the ED.  Pt's daughter called her while I was in  her room so I also was able to speak with her daughter as well.  Spoke with pt and daughter about new diagnosis.  Discussed A1C results with them and explained what an A1C is, basic pathophysiology of DM Type 2, basic home care, basic diabetes diet nutrition principles, importance of checking CBGs and maintaining good CBG control to prevent long-term and short-term complications.  Reviewed signs and symptoms of hyperglycemia and hypoglycemia and how to treat hypoglycemia at home.  Also reviewed blood sugar goals and A1c goals for home.    Also discussed with patient and daughter diagnosis of DKA (pathophysiology), treatment of DKA, lab results, and transition plan to SQ insulin regimen.  Explained to pt and daughter that given pt's presentation to the ED with high glucose, DKA, dehydration and current A1c of 16% that pt will likely need insulin for home.  Explained to pt that she will likely need Insulin at least in the short-term and that it's possible pt will need insulin for life.  We discussed that each pt is unique and it will take some time and follow up with her PCP to determine if insulin will be needed long-term.  Daughter told me she was concerned that pt's bout with COVID may have brought the diabetes on.  Pt had COVID back in December.  Discussed with pt and daughter that  we have seen many new diagnoses of diabetes in patients who get COVID and that perhaps pt had pre-existing pre-diabetes and that the COVID infection she had may have precipitated the onset of diabetes.  Daughter told me pt's brother and nephew have diabetes as well.  Discussed with pt and daughter that diabetes is strongly linked to genetics as well and that her family history also placed her at risk.  Pt relayed to me that she was having increased urination and thirst for about 1 week prior to coming to the ED.  We discussed that symptoms of diabetes may be delayed until the body reaches a point where it cannot handle the elevated  blood glucose levels.  Pt and pt's daughter appreciative of my visit.  Will have Diabetes RN follow up with pt tomorrow.  RNs to provide ongoing basic DM education at bedside with this patient.  Have ordered educational booklet, insulin starter kit.  Have also placed RD consult for DM diet education for this patient.     --Will follow patient during hospitalization--  Wyn Quaker RN, MSN, CDE Diabetes Coordinator Inpatient Glycemic Control Team Team Pager: [phone] (8a-5p)

## 2020-09-04 NOTE — ED Notes (Signed)
Attempted to call report to 2W. No answer.

## 2020-09-05 LAB — COMPREHENSIVE METABOLIC PANEL
ALT: 18 U/L (ref 0–44)
AST: 19 U/L (ref 15–41)
Albumin: 2.6 g/dL — ABNORMAL LOW (ref 3.5–5.0)
Alkaline Phosphatase: 85 U/L (ref 38–126)
Anion gap: 9 (ref 5–15)
BUN: 19 mg/dL (ref 8–23)
CO2: 19 mmol/L — ABNORMAL LOW (ref 22–32)
Calcium: 9 mg/dL (ref 8.9–10.3)
Chloride: 111 mmol/L (ref 98–111)
Creatinine, Ser: 1.08 mg/dL — ABNORMAL HIGH (ref 0.44–1.00)
GFR calc Af Amer: 57 mL/min — ABNORMAL LOW (ref >60)
GFR calc non Af Amer: 49 mL/min — ABNORMAL LOW (ref >60)
Glucose, Bld: 161 mg/dL — ABNORMAL HIGH (ref 70–99)
Potassium: 3.7 mmol/L (ref 3.5–5.1)
Sodium: 139 mmol/L (ref 135–145)
Total Bilirubin: 0.5 mg/dL (ref 0.3–1.2)
Total Protein: 5.7 g/dL — ABNORMAL LOW (ref 6.5–8.1)

## 2020-09-05 LAB — CBC
HCT: 38.7 % (ref 36.0–46.0)
Hemoglobin: 12.9 g/dL (ref 12.0–15.0)
MCH: 27.7 pg (ref 26.0–34.0)
MCHC: 33.3 g/dL (ref 30.0–36.0)
MCV: 83 fL (ref 80.0–100.0)
Platelets: 168 10*3/uL (ref 150–400)
RBC: 4.66 MIL/uL (ref 3.87–5.11)
RDW: 14.1 % (ref 11.5–15.5)
WBC: 9.9 10*3/uL (ref 4.0–10.5)
nRBC: 0.2 % (ref 0.0–0.2)

## 2020-09-05 LAB — GLUCOSE, CAPILLARY
Glucose-Capillary: 210 mg/dL — ABNORMAL HIGH (ref 70–99)
Glucose-Capillary: 345 mg/dL — ABNORMAL HIGH (ref 70–99)
Glucose-Capillary: 353 mg/dL — ABNORMAL HIGH (ref 70–99)
Glucose-Capillary: 176 mg/dL — ABNORMAL HIGH (ref 70–99)
Glucose-Capillary: 112 mg/dL — ABNORMAL HIGH (ref 70–99)

## 2020-09-05 LAB — HEPARIN LEVEL (UNFRACTIONATED): Heparin Unfractionated: 0.55 IU/mL (ref 0.30–0.70)

## 2020-09-05 MED ORDER — INSULIN GLARGINE 100 UNIT/ML ~~LOC~~ SOLN
20.0000 [IU] | Freq: Every day | SUBCUTANEOUS | Status: DC
  Administered 2020-09-06: 20 [IU] via SUBCUTANEOUS
  Filled 2020-09-05: qty 0.2

## 2020-09-05 MED ORDER — INSULIN ASPART 100 UNIT/ML ~~LOC~~ SOLN
5.0000 [IU] | Freq: Three times a day (TID) | SUBCUTANEOUS | Status: DC
  Administered 2020-09-05 – 2020-09-06 (×4): 5 [IU] via SUBCUTANEOUS

## 2020-09-05 MED ORDER — APIXABAN 5 MG PO TABS: 5.0000 mg | ORAL_TABLET | Freq: Two times a day (BID) | ORAL | Status: DC

## 2020-09-05 MED ORDER — APIXABAN 5 MG PO TABS
10.0000 mg | ORAL_TABLET | Freq: Two times a day (BID) | ORAL | Status: DC
  Administered 2020-09-05 – 2020-09-06 (×3): 10 mg via ORAL
  Filled 2020-09-05 (×3): qty 2

## 2020-09-05 NOTE — Progress Notes (Addendum)
ANTICOAGULATION CONSULT NOTE  Pharmacy Consult for heparin Indication: pulmonary embolus  Labs: Recent Labs    09/03/20 0936 09/03/20 0936 09/03/20 1618 09/03/20 2207 09/03/20 2207 09/03/20 2337 09/03/20 2337 09/04/20 0030 09/04/20 0030 09/04/20 0358 09/04/20 0358 09/04/20 0909 09/04/20 0912 09/04/20 1258 09/04/20 1500 09/05/20 0043  HGB 13.6   < >  --  14.3   < >  --   --   --   --  12.5  --   --   --   --   --  12.9  HCT 41.1   < >  --  42.0  --   --   --   --   --  37.9  --   --   --   --   --  38.7  PLT 155  --   --   --   --   --   --   --   --  159  --   --   --   --   --  168  HEPARINUNFRC  --   --   --   --   --   --   --  1.01*   < >  --   --   --  0.71*  --  0.62 0.55  CREATININE 1.54*   < >  --   --    < > 1.18*   < > 1.07*  --  0.99   < > 1.02*  --  0.95  --  1.08*  TROPONINIHS 65*   < > 183*  --   --  222*  --  216*  --   --   --   --   --   --   --   --    < > = values in this interval not displayed.    Assessment: 79yo female continues on IV heparin for acute PE with large burden of embolus, concern for RHS on CT.  Doppler negative for DVT. Patient is not on anticoagulation PTA.  Heparin level remains therapeutic; CBC wnl. No bleeding issues reported.  Goal of Therapy:  Heparin level 0.3-0.7 units/ml   Plan:  Continue heparin gtt at 1000 units/hr Monitor daily heparin level and CBC, s/sx bleeding F/u long-term anticoagulation plan as appropriate   Arturo Morton, PharmD, BCPS Please check AMION for all [place] contact numbers Clinical Pharmacist 09/05/2020 8:11 AM    ADDENDUM: Pharmacy consulted to transition heparin to apixaban. MD consulted case management for assistance. No bleed issues reported.  Plan: D/c heparin drip at time of 1st dose of apixaban - communicated plan with RN Apixaban 10mg  PO BID x 7 days; then 5mg  PO BID Monitor CBC, s/sx bleeding   Arturo Morton, PharmD, BCPS Please check AMION for all [place] contact  numbers Clinical Pharmacist 09/05/2020 1:58 PM

## 2020-09-05 NOTE — Evaluation (Signed)
Physical Therapy Evaluation Patient Details Name: Carla Wells MRN: [id number] DOB: Dec 15, 1940 Today's Date: 09/05/2020   History of Present Illness  Patient is a 79 y/o female who presents with SOB. Found to have acute PEs with large clot burden seen on CT scan with concern for right heart strain and DKA with new onset DM. PMh includes HTN, HLd, DM.  Clinical Impression  Patient presents with dyspnea on exertion, generalized weakness, impaired balance and decreased activity tolerance s/p above. Pt lives at home with adult children and is independent with ADLs and ambulation PTA. 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 Today, pt tolerated transfers and gait training with min guard assist for balance/safety. Noted to have some drifting but no overt LOB. Sp02 remained in high 90s on RA with 2/4 DOE with activity. Encouraged slowly increasing activity while in the hospital. Will follow acutely to maximize independence and mobility prior to return home.    Follow Up Recommendations No PT follow up;Supervision - Intermittent    Equipment Recommendations  None recommended by PT    Recommendations for Other Services       Precautions / Restrictions Precautions Precautions: Fall Restrictions Weight Bearing Restrictions: No      Mobility  Bed Mobility Overal bed mobility: Needs Assistance Bed Mobility: Supine to Sit     Supine to sit: Modified independent (Device/Increase time);HOB elevated     General bed mobility comments: No assist needed. No dizziness.  Transfers Overall transfer level: Needs assistance Equipment used: None Transfers: Sit to/from Stand Sit to Stand: Min guard         General transfer comment: Min guard for safety. Stood from [employer], needed to stand for a few seconds to get herself together. Transferred to chair near sink for wash up with tech present.  Ambulation/Gait Ambulation/Gait assistance: Min guard Gait Distance (Feet):  110 Feet Assistive device: None Gait Pattern/deviations: Step-through pattern;Decreased stride length;Drifts right/left Gait velocity: decreased   General Gait Details: Slow mildly unsteady gait with mild drifting noted but no overt LOB. SP02 remained in high 90s on RA. 2/4 DOE. HR stable.  Stairs            Wheelchair Mobility    Modified Rankin (Stroke Patients Only)       Balance Overall balance assessment: Needs assistance Sitting-balance support: Feet supported;No upper extremity supported Sitting balance-Leahy Scale: Good Sitting balance - Comments: Able to donn shoes sitting EOB without difficulty.   Standing balance support: During functional activity Standing balance-Leahy Scale: Fair Standing balance comment: Able to stand at sink and go through bag and reach outside BoS without difficulty.                             Pertinent Vitals/Pain Pain Assessment: No/denies pain    Home Living Family/patient expects to be discharged to:: Private residence Living Arrangements: Children Available Help at Discharge: Family;Available 24 hours/day Type of Home: House Home Access: Stairs to enter Entrance Stairs-Rails: Right Entrance Stairs-Number of Steps: 3 Home Layout: One level Home Equipment: None      Prior Function Level of Independence: Independent         Comments: Sits with elderly lady. Drives.     Hand Dominance        Extremity/Trunk Assessment   Upper Extremity Assessment Upper Extremity Assessment: Defer to OT evaluation    Lower Extremity Assessment Lower Extremity Assessment: Generalized weakness (but  functional)    Cervical / Trunk Assessment Cervical / Trunk Assessment: Normal  Communication   Communication: No difficulties  Cognition Arousal/Alertness: Awake/alert Behavior During Therapy: WFL for tasks assessed/performed Overall Cognitive Status: Within Functional Limits for tasks assessed                                  General Comments: for basic mobility tasks.      General Comments General comments (skin integrity, edema, etc.): VSS on RA.    Exercises     Assessment/Plan    PT Assessment Patient needs continued PT services  PT Problem List Decreased mobility;Decreased strength;Decreased balance;Decreased activity tolerance;Cardiopulmonary status limiting activity       PT Treatment Interventions Therapeutic activities;Gait training;Therapeutic exercise;Patient/family education;Balance training;Stair training;Functional mobility training    PT Goals (Current goals can be found in the Care Plan section)  Acute Rehab PT Goals Patient Stated Goal: to get home and return to work PT Goal Formulation: With patient Time For Goal Achievement: 09/19/20 Potential to Achieve Goals: Good    Frequency Min 3X/week   Barriers to discharge Inaccessible home environment stairs to enter home    Co-evaluation               AM-PAC PT "6 Clicks" Mobility  Outcome Measure Help needed turning from your back to your side while in a flat bed without using bedrails?: None Help needed moving from lying on your back to sitting on the side of a flat bed without using bedrails?: None Help needed moving to and from a bed to a chair (including a wheelchair)?: A Little Help needed standing up from a chair using your arms (e.g., wheelchair or bedside chair)?: A Little Help needed to walk in hospital room?: A Little Help needed climbing 3-5 steps with a railing? : A Little 6 Click Score: 20    End of Session Equipment Utilized During Treatment: Gait belt Activity Tolerance: Patient tolerated treatment well Patient left: in chair;with call bell/phone within reach (at sink ready for bath with tech in room) Nurse Communication: Mobility status PT Visit Diagnosis: Difficulty in walking, not elsewhere classified (R26.2);Unsteadiness on feet (R26.81)    Time: 6010-9323 PT Time  Calculation (min) (ACUTE ONLY): 15 min   Charges:   PT Evaluation $PT Eval Moderate Complexity: 1 Mod          Marisa Severin, PT, DPT Acute Rehabilitation Services Pager [phone] Office [phone]      Marguarite Arbour A Jerin Franzel 09/05/2020, 3:20 PM

## 2020-09-05 NOTE — TOC Benefit Eligibility Note (Signed)
Transition of Care [facility]) Benefit Eligibility Note    Patient Details  Name: [name] MRN: [id number] Date of Birth: May 27, 1941   Medication/Dose: Arne Cleveland  10 MG : NON-FORMULARY / ELIQUIS  2.5 MG BID   CO-PAY $45.00 , ELIQUIS  5 MG  CO-PAY- $ 45.00  and   XARELTO  15 MG BID  CO-PAY- $45.00 , XARELTO 20 MG DAILY CO-PAY-$45.00  Covered?: Yes  Tier: 3 Drug  Prescription Coverage [facility]: [facility]  Spoke with Person/Company/Phone Number:: TAKOYA  @  [employer] # [phone]  Co-Pay: $45.00  Prior Approval: No  Deductible: Met (OUT-OF-POCKET:UNMET)  Additional Notes: LANTUS COVER- YES CO-PAY- $746.00 TIER- 3  , LEVEMIR  COVER-YES  CO-PAY- $1,352.07 and   NOVOLOG  COVER-YES CO-PAY- $45.00 TIER- 3 DRUG    NO P/A    Memory Argue Phone Number: 09/05/2020, 11:45 AM

## 2020-09-05 NOTE — Discharge Instructions (Addendum)
Information on my medicine - ELIQUIS (apixaban)  This medication education was reviewed with me or my healthcare representative as part of my discharge preparation.  Why was Eliquis prescribed for you? Eliquis was prescribed to treat blood clots that may have been found in the veins of your legs (deep vein thrombosis) or in your lungs (pulmonary embolism) and to reduce the risk of them occurring again.  What do You need to know about Eliquis ? The starting dose is 10 mg (two 5 mg tablets) taken TWICE daily for the FIRST SEVEN (7) DAYS, then on (enter date)  09/12/2020  the dose is reduced to ONE 5 mg tablet taken TWICE daily.  Eliquis may be taken with or without food.   Try to take the dose about the same time in the morning and in the evening. If you have difficulty swallowing the tablet whole please discuss with your pharmacist how to take the medication safely.  Take Eliquis exactly as prescribed and DO NOT stop taking Eliquis without talking to the doctor who prescribed the medication.  Stopping may increase your risk of developing a new blood clot.  Refill your prescription before you run out.  After discharge, you should have regular check-up appointments with your healthcare provider that is prescribing your Eliquis.    What do you do if you miss a dose? If a dose of ELIQUIS is not taken at the scheduled time, take it as soon as possible on the same day and twice-daily administration should be resumed. The dose should not be doubled to make up for a missed dose.  Important Safety Information A possible side effect of Eliquis is bleeding. You should call your healthcare provider right away if you experience any of the following: ? Bleeding from an injury or your nose that does not stop. ? Unusual colored urine (red or dark brown) or unusual colored stools (red or black). ? Unusual bruising for unknown reasons. ? A serious fall or if you hit your head (even if there is no  bleeding).  Some medicines may interact with Eliquis and might increase your risk of bleeding or clotting while on Eliquis. To help avoid this, consult your healthcare provider or pharmacist prior to using any new prescription or non-prescription medications, including herbals, vitamins, non-steroidal anti-inflammatory drugs (NSAIDs) and supplements.  This website has more information on Eliquis (apixaban): http://www.eliquis.com/eliquis/home

## 2020-09-05 NOTE — Progress Notes (Signed)
PROGRESS NOTE  Carla Wells WYO:[id number] DOB: 1941/02/25 DOA: 09/03/2020 PCP: Susy Frizzle, MD   LOS: 2 days   Brief Narrative / Interim history: 79 year old female with HTN, HLD, CKD 3A who came into the hospital for shortness of breath.  This has been going on for the past month but worse in the last few days to the point that she came to the emergency room.  A CT angiogram on admission showed PEs.  She was also found to be in DKA, in the setting of new onset diabetes mellitus  Subjective / 24h Interval events: Reports persistent shortness of breath with activities.  Assessment & Plan: Principal Problem Acute pulmonary embolism-large clot burden seen on CT scan with concern for right heart strain.  No obvious provoking factors will stop PCCM evaluated patient but did not recommend lysis.  2D echo was done, showed EF 77-41%, grade 1 diastolic dysfunction, RV systolic function is normal with normal RV size.  She has tolerated heparin well, discussed with patient this morning risks benefits of oral anticoagulation and per preference will start Eliquis.  Case manager assisted with medication  Active Problems DKA, Type 2 diabetes mellitus-patient denies history of diabetes mellitus.  She was initially started on insulin infusion, her gap is closed and was placed on Lantus 18 units as well as sliding scale.  Fasting this morning is 210, gradually increasing towards lunch.  I will add 5 units scheduled NovoLog and increase Lantus.  Reviewed care management note, it appears that Lantus and Levemir are quite expensive however NovoLog was relatively well covered, and patient would probably benefit from NPH twice daily on discharge along with a NovoLog sliding scale.  Have asked RN to do teaching for CBG check as well as insulin administration, awaiting diabetes educator.  Suspect may be able to be discharged tomorrow if her CBGs are better controlled  CBG (last 3)  Recent Labs     09/05/20 0710 09/05/20 0816 09/05/20 1208  GLUCAP 210* 345* 353*    Elevated troponin-no chest pain, this is likely demand ischemia due to large PE.  Trending down.  Acute kidney injury chronic kidney disease stage IIIa-Baseline creatinine around 1.1-1.2, up to 1.5 on admission, improved with fluids  Essential hypertension-currently normotensive  Hyperlipidemia-continue statin   Scheduled Meds: . insulin aspart  0-15 Units Subcutaneous TID WC  . insulin aspart  0-5 Units Subcutaneous QHS  . insulin aspart  5 Units Subcutaneous TID WC  . insulin glargine  18 Units Subcutaneous Daily  . simvastatin  10 mg Oral QHS   Continuous Infusions: . heparin 1,000 Units/hr (09/04/20 2234)   PRN Meds:.dextrose  Diet Orders (From admission, onward)    Start     Ordered   09/04/20 1309  Diet Carb Modified Fluid consistency: Thin; Room service appropriate? Yes  Diet effective now       Question Answer Comment  Diet-HS Snack? Nothing   Calorie Level Medium 1600-2000   Fluid consistency: Thin   Room service appropriate? Yes      09/04/20 1309          DVT prophylaxis:      Code Status: Full Code  Family Communication: d/w patient   Status is: Inpatient  Remains inpatient appropriate because:IV treatments appropriate due to intensity of illness or inability to take PO and Inpatient level of care appropriate due to severity of illness   Dispo: The patient is from: Home  Anticipated d/c is to: Home              Anticipated d/c date is: 2 days              Patient currently is not medically stable to d/c.  Consultants:  PCCM  Procedures:  2D echo  Microbiology  None   Antimicrobials: None     Objective: Vitals:   09/04/20 1816 09/04/20 1952 09/04/20 2300 09/05/20 0750  BP: (!) 156/84 131/66 126/64 134/62  Pulse: 92   (!) 101  Resp: (!) 21   (!) 27  Temp: 97.9 F (36.6 C) 97.7 F (36.5 C) 98.7 F (37.1 C) 97.9 F (36.6 C)  TempSrc: Oral Oral   Oral  SpO2: 100% 98%  99%  Weight: 87.7 kg     Height: 5\' 4"  (1.626 m)       Intake/Output Summary (Last 24 hours) at 09/05/2020 1351 Last data filed at 09/05/2020 0859 Gross per 24 hour  Intake 586.59 ml  Output --  Net 586.59 ml   Filed Weights   09/04/20 1816  Weight: 87.7 kg    Examination:  Constitutional: No distress, comfortable at rest Eyes: No scleral icterus ENMT: mmm Neck: normal, supple Respiratory: Lungs are clear bilaterally, no wheezing, no crackles. Cardiovascular: Regular rate and rhythm, no murmurs, no peripheral edema Abdomen: Soft, nontender, nondistended, positive bowel sounds Musculoskeletal: no clubbing / cyanosis.  Skin: No rashes appreciated Neurologic: Nonfocal, equal strength   Data Reviewed: I have independently reviewed following labs and imaging studies   CBC: Recent Labs  Lab 09/03/20 0936 09/03/20 2207 09/04/20 0358 09/05/20 0043  WBC 10.7*  --  9.0 9.9  HGB 13.6 14.3 12.5 12.9  HCT 41.1 42.0 37.9 38.7  MCV 84.2  --  82.4 83.0  PLT 155  --  159 696   Basic Metabolic Panel: Recent Labs  Lab 09/04/20 0030 09/04/20 0358 09/04/20 0909 09/04/20 1258 09/05/20 0043  NA 137 138 136 139 139  K 3.6 3.7 3.6 3.9 3.7  CL 107 109 106 107 111  CO2 18* 20* 22 24 19*  GLUCOSE 135* 133* 162* 129* 161*  BUN 21 19 18 17 19   CREATININE 1.07* 0.99 1.02* 0.95 1.08*  CALCIUM 8.9 8.9 8.9 8.9 9.0   Liver Function Tests: Recent Labs  Lab 09/05/20 0043  AST 19  ALT 18  ALKPHOS 85  BILITOT 0.5  PROT 5.7*  ALBUMIN 2.6*   Coagulation Profile: No results for input(s): INR, PROTIME in the last 168 hours. HbA1C: Recent Labs    09/03/20 1618  HGBA1C 16.0*   CBG: Recent Labs  Lab 09/04/20 1725 09/04/20 2100 09/05/20 0710 09/05/20 0816 09/05/20 1208  GLUCAP 303* 217* 210* 345* 353*    Recent Results (from the past 240 hour(s))  Respiratory Panel by RT PCR (Flu A&B, Covid) - Nasopharyngeal Swab     Status: None   Collection Time:  09/03/20  4:23 PM   Specimen: Nasopharyngeal Swab  Result Value Ref Range Status   SARS Coronavirus 2 by RT PCR NEGATIVE NEGATIVE Final    Comment: (NOTE) SARS-CoV-2 target nucleic acids are NOT DETECTED.  The SARS-CoV-2 RNA is generally detectable in upper respiratoy specimens during the acute phase of infection. The lowest concentration of SARS-CoV-2 viral copies this assay can detect is 131 copies/mL. A negative result does not preclude SARS-Cov-2 infection and should not be used as the sole basis for treatment or other patient management decisions. A negative result may occur  with  improper specimen collection/handling, submission of specimen other than nasopharyngeal swab, presence of viral mutation(s) within the areas targeted by this assay, and inadequate number of viral copies (<131 copies/mL). A negative result must be combined with clinical observations, patient history, and epidemiological information. The expected result is Negative.  Fact Sheet for Patients:  PinkCheek.be  Fact Sheet for Healthcare Providers:  GravelBags.it  This test is no t yet approved or cleared by the [place] FDA and  has been authorized for detection and/or diagnosis of SARS-CoV-2 by FDA under an Emergency Use Authorization (EUA). This EUA will remain  in effect (meaning this test can be used) for the duration of the COVID-19 declaration under Section 564(b)(1) of the Act, 21 U.S.C. section 360bbb-3(b)(1), unless the authorization is terminated or revoked sooner.     Influenza A by PCR NEGATIVE NEGATIVE Final   Influenza B by PCR NEGATIVE NEGATIVE Final    Comment: (NOTE) The Xpert Xpress SARS-CoV-2/FLU/RSV assay is intended as an aid in  the diagnosis of influenza from Nasopharyngeal swab specimens and  should not be used as a sole basis for treatment. Nasal washings and  aspirates are unacceptable for Xpert Xpress  SARS-CoV-2/FLU/RSV  testing.  Fact Sheet for Patients: PinkCheek.be  Fact Sheet for Healthcare Providers: GravelBags.it  This test is not yet approved or cleared by the [place] FDA and  has been authorized for detection and/or diagnosis of SARS-CoV-2 by  FDA under an Emergency Use Authorization (EUA). This EUA will remain  in effect (meaning this test can be used) for the duration of the  Covid-19 declaration under Section 564(b)(1) of the Act, 21  U.S.C. section 360bbb-3(b)(1), unless the authorization is  terminated or revoked. Performed at [facility], [place] [address], [place], [place] [address]      Radiology Studies: ECHOCARDIOGRAM COMPLETE  Result Date: 09/04/2020    ECHOCARDIOGRAM REPORT   Patient Name:   Carla Wells Date of Exam: 09/04/2020 Medical Rec #:  [id number]        Height:       64.0 in Accession #:    [id number]       Weight:       204.0 lb Date of Birth:  04/17/1941         BSA:          1.973 m Patient Age:    20 years         BP:           138/62 mmHg Patient Gender: F                HR:           95 bpm. Exam Location:  Inpatient Procedure: 2D Echo, Color Doppler, Cardiac Doppler and Intracardiac            Opacification Agent Indications:    I26.02 Pulmonary embolus  History:        Patient has prior history of Echocardiogram examinations, most                 recent 09/15/2019. Risk Factors:Hypertension, Diabetes,                 Dyslipidemia and COVID+ 11/2019.  Sonographer:    Raquel Sarna Senior RDCS Referring Phys: [id number] [place]  Sonographer Comments: Technically difficult due to very poor echo windows. IMPRESSIONS  1. Overall very poor image quality.  2. Left ventricular ejection fraction, by estimation, is 60 to  65%. The left ventricle has normal function. The left ventricle has no regional wall motion abnormalities. Left ventricular diastolic parameters are consistent with Grade I diastolic  dysfunction (impaired relaxation).  3. Right ventricular systolic function is normal. The right ventricular size is normal. There is moderately elevated pulmonary artery systolic pressure.  4. The mitral valve is normal in structure. Trivial mitral valve regurgitation. No evidence of mitral stenosis.  5. The aortic valve was not well visualized. Aortic valve regurgitation is not visualized. No aortic stenosis is present.  6. The inferior vena cava is normal in size with greater than 50% respiratory variability, suggesting right atrial pressure of 3 mmHg. FINDINGS  Left Ventricle: Left ventricular ejection fraction, by estimation, is 60 to 65%. The left ventricle has normal function. The left ventricle has no regional wall motion abnormalities. Definity contrast agent was given IV to delineate the left ventricular  endocardial borders. The left ventricular internal cavity size was normal in size. There is no left ventricular hypertrophy. Left ventricular diastolic parameters are consistent with Grade I diastolic dysfunction (impaired relaxation). Right Ventricle: The right ventricular size is normal. No increase in right ventricular wall thickness. Right ventricular systolic function is normal. There is moderately elevated pulmonary artery systolic pressure. The tricuspid regurgitant velocity is 3.10 m/s, and with an assumed right atrial pressure of 8 mmHg, the estimated right ventricular systolic pressure is 66.0 mmHg. Left Atrium: Left atrial size was normal in size. Right Atrium: Right atrial size was normal in size. Pericardium: There is no evidence of pericardial effusion. Mitral Valve: The mitral valve is normal in structure. Trivial mitral valve regurgitation. No evidence of mitral valve stenosis. Tricuspid Valve: The tricuspid valve is normal in structure. Tricuspid valve regurgitation is mild . No evidence of tricuspid stenosis. Aortic Valve: The aortic valve was not well visualized. Aortic valve  regurgitation is not visualized. No aortic stenosis is present. Pulmonic Valve: The pulmonic valve was normal in structure. Pulmonic valve regurgitation is not visualized. No evidence of pulmonic stenosis. Aorta: The aortic root is normal in size and structure. Venous: The inferior vena cava is normal in size with greater than 50% respiratory variability, suggesting right atrial pressure of 3 mmHg. IAS/Shunts: No atrial level shunt detected by color flow Doppler. Additional Comments: Overall very poor image quality.  LEFT VENTRICLE PLAX 2D LVOT diam:     2.00 cm  Diastology LV SV:         82       LV e' medial:    6.53 cm/s LV SV Index:   42       LV E/e' medial:  9.8 LVOT Area:     3.14 cm LV e' lateral:   6.85 cm/s                         LV E/e' lateral: 9.4  RIGHT VENTRICLE RV S prime:     6.20 cm/s TAPSE (M-mode): 1.4 cm LEFT ATRIUM           Index       RIGHT ATRIUM           Index LA Vol (A4C): 53.8 ml 27.27 ml/m RA Area:     13.70 cm                                   RA Volume:   33.20 ml  16.83  ml/m  AORTIC VALVE LVOT Vmax:   108.00 cm/s LVOT Vmean:  84.700 cm/s LVOT VTI:    0.262 m  AORTA Ao Root diam: 3.10 cm MITRAL VALVE                TRICUSPID VALVE MV Area (PHT): 3.21 cm     TR Peak grad:   38.4 mmHg MV Decel Time: 236 msec     TR Vmax:        310.00 cm/s MV E velocity: 64.30 cm/s MV A velocity: 138.00 cm/s  SHUNTS MV E/A ratio:  0.47         Systemic VTI:  0.26 m                             Systemic Diam: 2.00 cm Jenkins Rouge MD Electronically signed by Jenkins Rouge MD Signature Date/Time: 09/04/2020/4:00:34 PM    Final    VAS [place] LOWER EXTREMITY VENOUS (DVT)  Result Date: 09/04/2020  Lower Venous DVTStudy Indications: Pulmonary embolism.  Limitations: Body habitus and poor ultrasound/tissue interface. Comparison Study: No prior study Performing Technologist: Maudry Mayhew MHA, RDMS, RVT, RDCS  Examination Guidelines: A complete evaluation includes B-mode imaging, spectral Doppler, color  Doppler, and power Doppler as needed of all accessible portions of each vessel. Bilateral testing is considered an integral part of a complete examination. Limited examinations for reoccurring indications may be performed as noted. The reflux portion of the exam is performed with the patient in reverse Trendelenburg.  +---------+---------------+---------+-----------+----------+--------------+ RIGHT    CompressibilityPhasicitySpontaneityPropertiesThrombus Aging +---------+---------------+---------+-----------+----------+--------------+ CFV      Full           Yes      Yes                                 +---------+---------------+---------+-----------+----------+--------------+ SFJ      Full                                                        +---------+---------------+---------+-----------+----------+--------------+ FV Prox  Full                                                        +---------+---------------+---------+-----------+----------+--------------+ FV Mid   Full                                                        +---------+---------------+---------+-----------+----------+--------------+ FV DistalFull                                                        +---------+---------------+---------+-----------+----------+--------------+ PFV      Full                                                        +---------+---------------+---------+-----------+----------+--------------+  POP      Full           Yes      Yes                                 +---------+---------------+---------+-----------+----------+--------------+ PTV                              Yes                                 +---------+---------------+---------+-----------+----------+--------------+ PERO                             Yes                                 +---------+---------------+---------+-----------+----------+--------------+   Right Technical Findings: Not  visualized segments include limited evaluation of right PTV, peroneal veins.  +---------+---------------+---------+-----------+----------+--------------+ LEFT     CompressibilityPhasicitySpontaneityPropertiesThrombus Aging +---------+---------------+---------+-----------+----------+--------------+ CFV      Full           Yes      Yes                                 +---------+---------------+---------+-----------+----------+--------------+ SFJ      Full                                                        +---------+---------------+---------+-----------+----------+--------------+ FV Prox  Full                                                        +---------+---------------+---------+-----------+----------+--------------+ FV Mid   Full                                                        +---------+---------------+---------+-----------+----------+--------------+ FV DistalFull                                                        +---------+---------------+---------+-----------+----------+--------------+ PFV      Full                                                        +---------+---------------+---------+-----------+----------+--------------+ POP      Full           Yes      Yes                                 +---------+---------------+---------+-----------+----------+--------------+  PTV      Full                                                        +---------+---------------+---------+-----------+----------+--------------+ PERO     Full                                                        +---------+---------------+---------+-----------+----------+--------------+     Summary: RIGHT: - There is no evidence of deep vein thrombosis in the lower extremity. However, portions of this examination were limited- see technologist comments above.  - No cystic structure found in the popliteal fossa.  LEFT: - There is no evidence of deep vein  thrombosis in the lower extremity.  - No cystic structure found in the popliteal fossa.  *See table(s) above for measurements and observations. Electronically signed by Ruta Hinds MD on 09/04/2020 at 4:52:36 PM.    Final     Marzetta Board, MD, PhD [facility]  Between 7 am - 7 pm I am available, please contact me via Amion or [place]  Between 7 pm - 7 am I am not available, please contact night coverage MD/APP via Amion

## 2020-09-05 NOTE — Progress Notes (Signed)
Inpatient Diabetes Program Recommendations  AACE/ADA: New Consensus Statement on Inpatient Glycemic Control (2015)  Target Ranges:  Prepandial:   less than 140 mg/dL      Peak postprandial:   less than 180 mg/dL (1-2 hours)      Critically ill patients:  140 - 180 mg/dL   Lab Results  Component Value Date   GLUCAP 353 (H) 09/05/2020   HGBA1C 16.0 (H) 09/03/2020    Review of Glycemic Control Results for Carla Wells, Carla Wells (MRN [id number]) as of 09/05/2020 13:54  Ref. Range 09/04/2020 17:25 09/04/2020 21:00 09/05/2020 07:10 09/05/2020 08:16 09/05/2020 12:08  Glucose-Capillary Latest Ref Range: 70 - 99 mg/dL 303 (H) 217 (H) 210 (H) 345 (H) 353 (H)   Diabetes history: DM 2 - New diagnosis  Outpatient Diabetes medications:  None Current orders for Inpatient glycemic control:  Lantus 20 units daily  Novolog 5 units tid with meals Novolog moderate tid with meals and HS  Inpatient Diabetes Program Recommendations:    F/u with patient. Reviewed use of insulin pen.  Educated patient on insulin pen use at home. Reviewed all steps if insulin pen including attachment of needle, 2-unit air shot, dialing up dose, giving injection, removing needle, disposal of sharps, storage of unused insulin, disposal of insulin etc.  Patient able to provide successful return demonstration.  Also reviewed troubleshooting with insulin pen.  MD to give patient Rxs for insulin pens and insulin pen needles. Also assisted with teaching patient how to administer insulin using insulin syringe.  Patient successfully was able to self-administer insulin dose at 1330 pm.  Reviewed importance of rotation of insulin administration sites as well.  We also discussed signs/symptoms of hypoglycemia and how to treat.  Patient was able to verbalize understanding and teach back. Called and discussed with patient's daughter as well.  Sent her video on how to use insulin pen.  She states that she will be helping her mother with DM at home.    Thanks Adah Perl, RN, BC-ADM Inpatient Diabetes Coordinator Pager [phone] (8a-5p)

## 2020-09-05 NOTE — Plan of Care (Signed)
°  RD consulted for nutrition education regarding diabetes.   Lab Results  Component Value Date   HGBA1C 16.0 (H) 09/03/2020    RD provided "Carbohydrate Counting for People with Diabetes" handout from the Academy of Nutrition and Dietetics. Discussed different food groups and their effects on blood sugar, emphasizing carbohydrate-containing foods. Provided list of carbohydrates and recommended serving sizes of common foods.  Discussed importance of controlled and consistent carbohydrate intake throughout the day. Provided examples of ways to balance meals/snacks and encouraged intake of high-fiber, whole grain complex carbohydrates. Teach back method used.  Pt typically eats three small meals daily that consist of B-cookies with coffee L- microwave dinner (alfredo) D- meat, vegetable, grain. Drinks soda and sweet tea. Discussed how to make meals balanced, which foods contain carbohydrates, how to count carbs, and how to make appropriate beverage substitutions. Pt would likely benefit from further outpatient education. Referral made.   Expect good compliance.  Current diet order is carb modified, patient is consuming approximately 100% of meals at this time. Labs and medications reviewed. No further nutrition interventions warranted at this time. RD contact information provided. If additional nutrition issues arise, please re-consult RD.  Mariana Single RD, LDN Clinical Nutrition Pager listed in [place]

## 2020-09-05 NOTE — Plan of Care (Signed)
Patient denies  pain or discomfort. Patient demonstrated correct administration of insulin. Heparin drip d/c and [place] given as ordered. Safety precautions maintained.

## 2020-09-06 DIAGNOSIS — E1159 Type 2 diabetes mellitus with other circulatory complications: Secondary | ICD-10-CM

## 2020-09-06 DIAGNOSIS — I152 Hypertension secondary to endocrine disorders: Secondary | ICD-10-CM

## 2020-09-06 LAB — BASIC METABOLIC PANEL
Anion gap: 9 (ref 5–15)
BUN: 18 mg/dL (ref 8–23)
CO2: 20 mmol/L — ABNORMAL LOW (ref 22–32)
Calcium: 8.9 mg/dL (ref 8.9–10.3)
Chloride: 107 mmol/L (ref 98–111)
Creatinine, Ser: 1.11 mg/dL — ABNORMAL HIGH (ref 0.44–1.00)
GFR calc non Af Amer: 47 mL/min — ABNORMAL LOW (ref >60)
Glucose, Bld: 126 mg/dL — ABNORMAL HIGH (ref 70–99)
Potassium: 3.4 mmol/L — ABNORMAL LOW (ref 3.5–5.1)
Sodium: 136 mmol/L (ref 135–145)

## 2020-09-06 LAB — CBC
HCT: 38.2 % (ref 36.0–46.0)
Hemoglobin: 12.4 g/dL (ref 12.0–15.0)
MCH: 27.1 pg (ref 26.0–34.0)
MCHC: 32.5 g/dL (ref 30.0–36.0)
MCV: 83.6 fL (ref 80.0–100.0)
Platelets: 174 10*3/uL (ref 150–400)
RBC: 4.57 MIL/uL (ref 3.87–5.11)
RDW: 14.4 % (ref 11.5–15.5)
WBC: 8.2 10*3/uL (ref 4.0–10.5)
nRBC: 0 % (ref 0.0–0.2)

## 2020-09-06 LAB — GLUCOSE, CAPILLARY
Glucose-Capillary: 152 mg/dL — ABNORMAL HIGH (ref 70–99)
Glucose-Capillary: 237 mg/dL — ABNORMAL HIGH (ref 70–99)

## 2020-09-06 MED ORDER — APIXABAN 5 MG PO TABS: ORAL_TABLET | ORAL | 0 refills | Status: DC

## 2020-09-06 MED ORDER — ALBUTEROL SULFATE HFA 108 (90 BASE) MCG/ACT IN AERS: 2.0000 | INHALATION_SPRAY | RESPIRATORY_TRACT | 0 refills | Status: DC | PRN

## 2020-09-06 MED ORDER — POTASSIUM CHLORIDE CRYS ER 20 MEQ PO TBCR
40.0000 meq | EXTENDED_RELEASE_TABLET | Freq: Two times a day (BID) | ORAL | Status: DC
  Administered 2020-09-06: 40 meq via ORAL
  Filled 2020-09-06: qty 2

## 2020-09-06 MED ORDER — NOVOLOG 70/30 FLEXPEN RELION (70-30) 100 UNIT/ML ~~LOC~~ SUPN: 16.0000 [IU] | PEN_INJECTOR | Freq: Two times a day (BID) | SUBCUTANEOUS | 11 refills | Status: DC

## 2020-09-06 MED ORDER — BLOOD GLUCOSE MONITOR KIT: PACK | 0 refills | Status: DC

## 2020-09-06 MED ORDER — INSULIN PEN NEEDLE 32G X 4 MM MISC: 1.0000 | Freq: Two times a day (BID) | 0 refills | Status: AC

## 2020-09-06 MED ORDER — POTASSIUM CHLORIDE CRYS ER 20 MEQ PO TBCR
20.0000 meq | EXTENDED_RELEASE_TABLET | Freq: Once | ORAL | Status: AC
  Administered 2020-09-06: 20 meq via ORAL
  Filled 2020-09-06: qty 1

## 2020-09-06 NOTE — Consult Note (Signed)
   [facility] CM Inpatient Consult   09/06/2020  Carla Wells 1941/01/01 [id number]   [facility] [ACO] Patient: [employer]   Patient evaluated for community based chronic disease management services with [facility] as a benefit of patient's [employer] for complex disease management. .  Medical record reviewed for post hospital follow up needs. Patient has a Hemoglobin A1C of 16.0 noted. Briefly, reviewed Diabetic Coordinator's notes.  Spoke with patient at bedside hospital phone to explain [facility] services.  Patient will receive post hospital discharge call and for assessments fo community needs for care/disease management.  Patient verbalized consent and also endorses Dr. Jenna Luo as her primary care provider, this office provides the [facility] follow up.  Plan:  Patient will be assigned to a [facility].Will follow up with and let Inpatient Transition of Care [TOC] Case Manager aware that [facility] following.   Of note, [facility] Care Management services does not replace or interfere with any services that are arranged by inpatient Transition of Care [TOC] team     For additional questions or referrals please contact:    Natividad Brood, RN BSN [facility] Liaison  [phone] business mobile phone Toll free office [phone]  Fax number: [phone] [name]  [email or web address]

## 2020-09-06 NOTE — Progress Notes (Signed)
Inpatient Diabetes Program Recommendations  AACE/ADA: New Consensus Statement on Inpatient Glycemic Control (2015)  Target Ranges:  Prepandial:   less than 140 mg/dL      Peak postprandial:   less than 180 mg/dL (1-2 hours)      Critically ill patients:  140 - 180 mg/dL   Lab Results  Component Value Date   GLUCAP 237 (H) 09/06/2020   HGBA1C 16.0 (H) 09/03/2020    Review of Glycemic Control Results for Carla Wells, Carla Wells (MRN [id number]) as of 09/06/2020 13:45  Ref. Range 09/05/2020 16:28 09/05/2020 21:00 09/06/2020 07:58 09/06/2020 11:56  Glucose-Capillary Latest Ref Range: 70 - 99 mg/dL 176 (H) 112 (H) 152 (H) 237 (H)   Diabetes history: DM 2 - New diagnosis  Outpatient Diabetes medications:  None Current orders for Inpatient glycemic control:  Lantus 20 units daily  Novolog 5 units tid with meals Novolog moderate tid with meals and HS  Inpatient Diabetes Program Recommendations:    In preparation for discharge, consider Novolin 70/30 16 units BID. Secure chat sent to MD.  Damaris Schooner with patient and daughter regarding discharge insulin, 70/30, how to purchase, when to inject, dosage, when to call MD, interventions for hypoglycemia, and survival skills. Patient verbalizes comfortability with injections. Relion information attached to DC summary. Patient and daughter have no further questions.   Thanks, Bronson Curb, MSN, RNC-OB Diabetes Coordinator [phone] ([phone])

## 2020-09-06 NOTE — Care Management Important Message (Signed)
Important Message  Patient Details  Name: Carla Wells MRN: [id number] Date of Birth: [birth year]   Medicare Important Message Given:  Yes     Kentaro Alewine 09/06/2020, 2:36 PM

## 2020-09-06 NOTE — Progress Notes (Signed)
MD request for prescription card for apixaban.  CSW printed off copy from website and delivered to pt. Lurline Idol, MSW, LCSW 10/6/20211:15 PM

## 2020-09-06 NOTE — Discharge Summary (Signed)
Physician Discharge Summary  Carla Wells ACZ:[id number] DOB: 17-Aug-1941 DOA: 09/03/2020  PCP: Carla Frizzle, MD  Admit date: 09/03/2020 Discharge date: 09/06/2020  Admitted From: Home Disposition:  Home  Recommendations for Outpatient Follow-up:  1. Follow up with PCP in 1-2 weeks 2. Follow up with Diabetes Education Coordinator within 1-2 weeks 3. Please obtain CMP/CBC, Mag, Phos in one week 4. Please follow up on the following pending results:  Home Health: No Equipment/Devices: None recommended by PT/OT  Discharge Condition: Stable CODE STATUS: FULL CODE Diet recommendation: Heart Healthy Carb Modified Diet   Brief/Interim Summary: The patient is a 79 year old obese African-American female with a past medical history significant for but not limited to hypertension, hyperlipidemia, chronic kidney disease stage III who came to the hospital with shortness of breath.  Shortness of breath have been going on for the past month and was worse last few days and she came to the emergency room.  CT angiogram showed PEs.  She is also found to be in DKA with new onset of diabetes mellitus type 2.  Hemoglobin A1c was 16.  Her blood sugars improved after she was transitioned to insulin and she tolerated heparin drip well and was transitioned to p.o. anticoagulation with apixaban.  We will give her free 30-day supply of apixaban and she was educated by the diabetes education coordinator on insulin administration and she will be discharged on NovoLog 70/30 given the prohibitive expense of the Lantus and Levemir.  Currently she is medically stable to be discharged as she did not desaturate on amatory home O2 screen and blood sugars have been fairly well controlled since that insulin regimen has been adjusted.  She will need to follow-up with PCP within 1 week.  Discharge Diagnoses:  Principal Problem:   Acute pulmonary embolus (HCC) Active Problems:   Hypertension associated with diabetes ([place])    Diabetic ketoacidosis without coma associated with type 2 diabetes mellitus ([place])   Acute kidney injury superimposed on CKD ([place])   Hyperlipidemia associated with type 2 diabetes mellitus ([place])  Acute pulmonary Embolus -Large clot burden seen on CT scan with concern for right heart strain.  No obvious provoking factors will stop PCCM evaluated patient but did not recommend lysis.   -2D echo was done, showed EF 10-93%, grade 1 diastolic dysfunction, RV systolic function is normal with normal RV size.   -She has tolerated heparin well, discussed with patient this morning risks benefits of oral anticoagulation and per preference will start Eliquis.  Case manager assisted with medication and she will get a free 30 day Supply -No DVT on LE Duplex -C/w Apixaban 10 mg po BID x7 days then 5 mg po BID -Did not Desaturate on Home O2 Screen   DKA, Type 2 diabetes mellitus-patient denies history of diabetes mellitus.  She was initially started on insulin infusion, her gap is closed and was placed on Lantus 18 units as well as sliding scale.  Fasting this morning is 210, gradually increasing towards lunch.  Dr. Cruzita Wells  added 5 units scheduled NovoLog and increase Lantus.  Reviewed care management note, it appears that Lantus and Levemir are quite expensive however NovoLog was relatively well covered, and patient would probably benefit from NPH twice daily on discharge along with a NovoLog sliding scale.  Have asked RN to do teaching for CBG check as well as insulin administration, awaiting diabetes educator.  Suspect may be able to be discharged today since CBG's are better -Will discharge on Novolog 70/30  16 units BID at the Diabetes Education Coordinator Recc's -Will need to follow up with Diabetes Education Coordinator within 1-2 weeks   Elevated troponin -no chest pain, this is likely demand ischemia due to large PE.   -Trending down with last Troponin I of 216  Acute kidney injury chronic kidney  disease stage IIIa Metabolic Acidosis -Baseline creatinine around 1.1-1.2, up to 1.5 on admission, improved with fluids -BUN/Cr is now 18/1.11; Metabolic Acidosis is mild with a CO2 of 20, anion gap of 9, chloride level 175 rectally monitor and trend and repeat CMP in the outpatient setting  Hypokalemia -Mild at 3.4 -Replete with po KCl 40 mEQ BID x2 -Continue to Monitor and Replete as Necessary -Repeat CMP within 1 week  Essential hypertension -currently normotensive -Resume Home Amlodipine 10 mg po Daily   Hyperlipidemia -continue statin  Obesity -Complicates care and overall prognosis -Estimated body mass index is 35.08 kg/m as calculated from the following:   Height as of this encounter: 5\' 4"  (1.626 m).   Weight as of this encounter: 92.7 kg. -Continued weight loss and dietary counseling  Discharge Instructions  Discharge Instructions    AMB Referral to [facility]   Complete by: As directed    Please assign to [facility] Coordinator for complex care for newly diagnosed diabetes Hgb A1c 16.0, daughter is her support person Wells,Carla [phone] and disease management follow up calls and assess for further needs. PCP - [place] does the [facility] calls.   Questions please call:   Natividad Brood, RN BSN [facility] Liaison  [phone] business mobile phone Toll free office [phone]  Fax number: [phone] [name]   Reason for consult: New Diabetic   Diagnoses of:  COPD/ Pneumonia Diabetes Other     Other Diagnosis: COVID - 19 positive vaccinated   Expected date of contact: 1-3 days (reserved for hospital discharges)   Amb Referral to Nutrition and Diabetic E   Complete by: As directed    Call MD for:  difficulty breathing, headache or visual disturbances   Complete by: As directed    Call MD for:  extreme fatigue   Complete by: As directed     Call MD for:  hives   Complete by: As directed    Call MD for:  persistant dizziness or light-headedness   Complete by: As directed    Call MD for:  persistant nausea and vomiting   Complete by: As directed    Call MD for:  redness, tenderness, or signs of infection (pain, swelling, redness, odor or green/yellow discharge around incision site)   Complete by: As directed    Call MD for:  severe uncontrolled pain   Complete by: As directed    Call MD for:  temperature >100.4   Complete by: As directed    Diet - low sodium heart healthy   Complete by: As directed    Diet Carb Modified   Complete by: As directed    Discharge instructions   Complete by: As directed    You were cared for by a hospitalist during your hospital stay. If you have any questions about your discharge medications or the care you received while you were in the hospital after you are discharged, you can call the unit and ask to speak with the hospitalist on call if the hospitalist that took care of you is not available. Once you are discharged, your primary care physician will handle any further  medical issues. Please note that NO REFILLS for any discharge medications will be authorized once you are discharged, as it is imperative that you return to your primary care physician (or establish a relationship with a primary care physician if you do not have one) for your aftercare needs so that they can reassess your need for medications and monitor your lab values.  Follow up with PCP and outpatient Diabetes Education Coordinator. Take all medications as prescribed. If symptoms change or worsen please return to the ED for evaluation   Increase activity slowly   Complete by: As directed      Allergies as of 09/06/2020      Reactions   Penicillins Swelling   Facial swelling      Medication List    TAKE these medications   albuterol 108 (90 Base) MCG/ACT inhaler Commonly known as: VENTOLIN HFA Inhale 2 puffs into the  lungs every 4 (four) hours as needed for wheezing or shortness of breath.   amLODipine 10 MG tablet Commonly known as: NORVASC TAKE 1 TABLET EVERY DAY   apixaban 5 MG Tabs tablet Commonly known as: ELIQUIS Take 2 tablets (10 mg total) by mouth 2 (two) times daily for 7 days, THEN 1 tablet (5 mg total) 2 (two) times daily for 28 days. Start taking on: September 12, 2020   aspirin EC 81 MG tablet Take 81 mg by mouth daily. Swallow whole.   blood glucose meter kit and supplies Kit Dispense based on patient and insurance preference. Use up to four times daily as directed. (FOR ICD-9 250.00, 250.01).   CALCIUM + D PO Take 1 tablet by mouth daily.   Insulin Pen Needle 32G X 4 MM Misc 1 Container by Does not apply route in the morning and at bedtime.   latanoprost 0.005 % ophthalmic solution Commonly known as: XALATAN Place 1 drop into both eyes at bedtime.   losartan-hydrochlorothiazide 50-12.5 MG tablet Commonly known as: HYZAAR TAKE 1 TABLET EVERY DAY   Mitigare 0.6 MG Caps Generic drug: Colchicine 2 tablets immediately then repeat 1 tablet 0.6 mg in 1 hour if pain persists.  Repeat this process daily until better What changed:   how much to take  how to take this  when to take this  additional instructions   NovoLOG 70/30 FlexPen ReliOn (70-30) 100 UNIT/ML FlexPen Generic drug: insulin aspart protamine - aspart Inject 0.16 mLs (16 Units total) into the skin 2 (two) times daily.   omeprazole 40 MG capsule Commonly known as: PRILOSEC TAKE 1 CAPSULE EVERY DAY   simvastatin 10 MG tablet Commonly known as: ZOCOR TAKE 1 TABLET AT BEDTIME   VITAMIN D3 PO Take 1 tablet by mouth daily.       Allergies  Allergen Reactions  . Penicillins Swelling    Facial swelling    Consultations:  PCCM  Procedures/Studies: DG Chest 2 View  Result Date: 09/03/2020 CLINICAL DATA:  Shortness of breath. EXAM: CHEST - 2 VIEW COMPARISON:  December 15, 2018. FINDINGS: The heart  size and mediastinal contours are within normal limits. Both lungs are clear. No pneumothorax or pleural effusion is noted. The visualized skeletal structures are unremarkable. IMPRESSION: No active cardiopulmonary disease. Electronically Signed   By: Marijo Conception M.D.   On: 09/03/2020 09:51   CT Angio Chest PE W and/or Wo Contrast  Result Date: 09/03/2020 CLINICAL DATA:  PE suspected, shortness of breath for 1 month EXAM: CT ANGIOGRAPHY CHEST WITH CONTRAST TECHNIQUE: Multidetector CT imaging of  the chest was performed using the standard protocol during bolus administration of intravenous contrast. Multiplanar CT image reconstructions and MIPs were obtained to evaluate the vascular anatomy. CONTRAST:  67mL OMNIPAQUE IOHEXOL 350 MG/ML SOLN COMPARISON:  None. FINDINGS: Cardiovascular: Satisfactory opacification of the pulmonary arteries to the segmental level. Positive examination for pulmonary embolism with a large burden of embolus present in the distal pulmonary arteries and nearly all distal lobar and segmental branch vessels. Enlargement of the RV LV ratio approximately 1.6-1. The main pulmonary artery is normal in caliber measuring 2.7 cm. Normal heart size. No pericardial effusion. Three-vessel coronary artery calcifications. Aortic atherosclerosis. Mediastinum/Nodes: No enlarged mediastinal, hilar, or axillary lymph nodes. Small hiatal hernia. Thyroid gland, trachea, and esophagus demonstrate no significant findings. Lungs/Pleura: Lungs are clear. No pleural effusion or pneumothorax. Upper Abdomen: No acute abnormality. Musculoskeletal: No chest wall abnormality. No acute or significant osseous findings. Review of the MIP images confirms the above findings. IMPRESSION: 1. Positive examination for pulmonary embolism with a large burden of embolus present in the distal pulmonary arteries and nearly all distal lobar and segmental branch vessels. 2. Enlargement of the RV LV ratio approximately 1.6-1,  concerning for right heart strain. The main pulmonary artery is normal in caliber measuring 2.7 cm. 3. Coronary artery disease.  Aortic Atherosclerosis (ICD10-I70.0). 4. Small hiatal hernia. These results were called by telephone at the time of interpretation on 09/03/2020 at 5:58 pm to PA [facility] , who verbally acknowledged these results. Electronically Signed   By: Eddie Candle M.D.   On: 09/03/2020 18:00   ECHOCARDIOGRAM COMPLETE  Result Date: 09/04/2020    ECHOCARDIOGRAM REPORT   Patient Name:   ENNIS DELPOZO SETTLE Date of Exam: 09/04/2020 Medical Rec #:  [id number]        Height:       64.0 in Accession #:    [id number]       Weight:       204.0 lb Date of Birth:  07/24/1941         BSA:          1.973 m Patient Age:    19 years         BP:           138/62 mmHg Patient Gender: F                HR:           95 bpm. Exam Location:  Inpatient Procedure: 2D Echo, Color Doppler, Cardiac Doppler and Intracardiac            Opacification Agent Indications:    I26.02 Pulmonary embolus  History:        Patient has prior history of Echocardiogram examinations, most                 recent 09/15/2019. Risk Factors:Hypertension, Diabetes,                 Dyslipidemia and COVID+ 11/2019.  Sonographer:    Raquel Sarna Senior RDCS Referring Phys: [id number] [place]  Sonographer Comments: Technically difficult due to very poor echo windows. IMPRESSIONS  1. Overall very poor image quality.  2. Left ventricular ejection fraction, by estimation, is 60 to 65%. The left ventricle has normal function. The left ventricle has no regional wall motion abnormalities. Left ventricular diastolic parameters are consistent with Grade I diastolic dysfunction (impaired relaxation).  3. Right ventricular systolic function is normal. The right ventricular size is normal. There is  moderately elevated pulmonary artery systolic pressure.  4. The mitral valve is normal in structure. Trivial mitral valve regurgitation. No evidence of mitral  stenosis.  5. The aortic valve was not well visualized. Aortic valve regurgitation is not visualized. No aortic stenosis is present.  6. The inferior vena cava is normal in size with greater than 50% respiratory variability, suggesting right atrial pressure of 3 mmHg. FINDINGS  Left Ventricle: Left ventricular ejection fraction, by estimation, is 60 to 65%. The left ventricle has normal function. The left ventricle has no regional wall motion abnormalities. Definity contrast agent was given IV to delineate the left ventricular  endocardial borders. The left ventricular internal cavity size was normal in size. There is no left ventricular hypertrophy. Left ventricular diastolic parameters are consistent with Grade I diastolic dysfunction (impaired relaxation). Right Ventricle: The right ventricular size is normal. No increase in right ventricular wall thickness. Right ventricular systolic function is normal. There is moderately elevated pulmonary artery systolic pressure. The tricuspid regurgitant velocity is 3.10 m/s, and with an assumed right atrial pressure of 8 mmHg, the estimated right ventricular systolic pressure is 74.2 mmHg. Left Atrium: Left atrial size was normal in size. Right Atrium: Right atrial size was normal in size. Pericardium: There is no evidence of pericardial effusion. Mitral Valve: The mitral valve is normal in structure. Trivial mitral valve regurgitation. No evidence of mitral valve stenosis. Tricuspid Valve: The tricuspid valve is normal in structure. Tricuspid valve regurgitation is mild . No evidence of tricuspid stenosis. Aortic Valve: The aortic valve was not well visualized. Aortic valve regurgitation is not visualized. No aortic stenosis is present. Pulmonic Valve: The pulmonic valve was normal in structure. Pulmonic valve regurgitation is not visualized. No evidence of pulmonic stenosis. Aorta: The aortic root is normal in size and structure. Venous: The inferior vena cava is normal  in size with greater than 50% respiratory variability, suggesting right atrial pressure of 3 mmHg. IAS/Shunts: No atrial level shunt detected by color flow Doppler. Additional Comments: Overall very poor image quality.  LEFT VENTRICLE PLAX 2D LVOT diam:     2.00 cm  Diastology LV SV:         82       LV e' medial:    6.53 cm/s LV SV Index:   42       LV E/e' medial:  9.8 LVOT Area:     3.14 cm LV e' lateral:   6.85 cm/s                         LV E/e' lateral: 9.4  RIGHT VENTRICLE RV S prime:     6.20 cm/s TAPSE (M-mode): 1.4 cm LEFT ATRIUM           Index       RIGHT ATRIUM           Index LA Vol (A4C): 53.8 ml 27.27 ml/m RA Area:     13.70 cm                                   RA Volume:   33.20 ml  16.83 ml/m  AORTIC VALVE LVOT Vmax:   108.00 cm/s LVOT Vmean:  84.700 cm/s LVOT VTI:    0.262 m  AORTA Ao Root diam: 3.10 cm MITRAL VALVE  TRICUSPID VALVE MV Area (PHT): 3.21 cm     TR Peak grad:   38.4 mmHg MV Decel Time: 236 msec     TR Vmax:        310.00 cm/s MV E velocity: 64.30 cm/s MV A velocity: 138.00 cm/s  SHUNTS MV E/A ratio:  0.47         Systemic VTI:  0.26 m                             Systemic Diam: 2.00 cm Jenkins Rouge MD Electronically signed by Jenkins Rouge MD Signature Date/Time: 09/04/2020/4:00:34 PM    Final    VAS [place] LOWER EXTREMITY VENOUS (DVT)  Result Date: 09/04/2020  Lower Venous DVTStudy Indications: Pulmonary embolism.  Limitations: Body habitus and poor ultrasound/tissue interface. Comparison Study: No prior study Performing Technologist: Maudry Mayhew MHA, RDMS, RVT, RDCS  Examination Guidelines: A complete evaluation includes B-mode imaging, spectral Doppler, color Doppler, and power Doppler as needed of all accessible portions of each vessel. Bilateral testing is considered an integral part of a complete examination. Limited examinations for reoccurring indications may be performed as noted. The reflux portion of the exam is performed with the patient in reverse  Trendelenburg.  +---------+---------------+---------+-----------+----------+--------------+ RIGHT    CompressibilityPhasicitySpontaneityPropertiesThrombus Aging +---------+---------------+---------+-----------+----------+--------------+ CFV      Full           Yes      Yes                                 +---------+---------------+---------+-----------+----------+--------------+ SFJ      Full                                                        +---------+---------------+---------+-----------+----------+--------------+ FV Prox  Full                                                        +---------+---------------+---------+-----------+----------+--------------+ FV Mid   Full                                                        +---------+---------------+---------+-----------+----------+--------------+ FV DistalFull                                                        +---------+---------------+---------+-----------+----------+--------------+ PFV      Full                                                        +---------+---------------+---------+-----------+----------+--------------+ POP      Full  Yes      Yes                                 +---------+---------------+---------+-----------+----------+--------------+ PTV                              Yes                                 +---------+---------------+---------+-----------+----------+--------------+ PERO                             Yes                                 +---------+---------------+---------+-----------+----------+--------------+   Right Technical Findings: Not visualized segments include limited evaluation of right PTV, peroneal veins.  +---------+---------------+---------+-----------+----------+--------------+ LEFT     CompressibilityPhasicitySpontaneityPropertiesThrombus Aging +---------+---------------+---------+-----------+----------+--------------+  CFV      Full           Yes      Yes                                 +---------+---------------+---------+-----------+----------+--------------+ SFJ      Full                                                        +---------+---------------+---------+-----------+----------+--------------+ FV Prox  Full                                                        +---------+---------------+---------+-----------+----------+--------------+ FV Mid   Full                                                        +---------+---------------+---------+-----------+----------+--------------+ FV DistalFull                                                        +---------+---------------+---------+-----------+----------+--------------+ PFV      Full                                                        +---------+---------------+---------+-----------+----------+--------------+ POP      Full           Yes      Yes                                 +---------+---------------+---------+-----------+----------+--------------+  PTV      Full                                                        +---------+---------------+---------+-----------+----------+--------------+ PERO     Full                                                        +---------+---------------+---------+-----------+----------+--------------+     Summary: RIGHT: - There is no evidence of deep vein thrombosis in the lower extremity. However, portions of this examination were limited- see technologist comments above.  - No cystic structure found in the popliteal fossa.  LEFT: - There is no evidence of deep vein thrombosis in the lower extremity.  - No cystic structure found in the popliteal fossa.  *See table(s) above for measurements and observations. Electronically signed by Ruta Hinds MD on 09/04/2020 at 4:52:36 PM.    Final     Subjective: And examined at bedside and she was not dyspneic.  Her blood  sugars were better controlled and she was educated by the diabetes education coronary.  We will transition her to NovoLog 70/30 16 units twice daily.  Strips were called into [facility] and patient understood and agreed with the plan of care.  Daughter was updated and all questions were answered to their satisfaction.  Currently she is stable to be discharged and will need to follow-up with PCP and follow-up with diabetes education coronary within 1 to 2 weeks.  Discharge Exam: Vitals:   09/06/20 0759 09/06/20 1213  BP: 139/66 (!) 135/99  Pulse: 95 85  Resp: (!) 22 16  Temp: (!) 97.5 F (36.4 C)   SpO2: 99% 99%   Vitals:   09/06/20 0406 09/06/20 0537 09/06/20 0759 09/06/20 1213  BP: 134/67  139/66 (!) 135/99  Pulse: 82  95 85  Resp: 19  (!) 22 16  Temp: 98 F (36.7 C)  (!) 97.5 F (36.4 C)   TempSrc: Oral  Oral   SpO2: 97%  99% 99%  Weight:  92.7 kg    Height:       General: Pt is alert, awake, not in acute distress Cardiovascular: RRR, S1/S2 +, no rubs, no gallops Respiratory: Diminished bilaterally, no wheezing, no rhonchi; unlabored breathing Abdominal: Soft, NT, distended secondary to body habitus, bowel sounds + Extremities: Trace edema, no cyanosis  The results of significant diagnostics from this hospitalization (including imaging, microbiology, ancillary and laboratory) are listed below for reference.    Microbiology: Recent Results (from the past 240 hour(s))  Respiratory Panel by RT PCR (Flu A&B, Covid) - Nasopharyngeal Swab     Status: None   Collection Time: 09/03/20  4:23 PM   Specimen: Nasopharyngeal Swab  Result Value Ref Range Status   SARS Coronavirus 2 by RT PCR NEGATIVE NEGATIVE Final    Comment: (NOTE) SARS-CoV-2 target nucleic acids are NOT DETECTED.  The SARS-CoV-2 RNA is generally detectable in upper respiratoy specimens during the acute phase of infection. The lowest concentration of SARS-CoV-2 viral copies this assay can detect is 131 copies/mL. A  negative result does not preclude SARS-Cov-2 infection and should not be used as the sole  basis for treatment or other patient management decisions. A negative result may occur with  improper specimen collection/handling, submission of specimen other than nasopharyngeal swab, presence of viral mutation(s) within the areas targeted by this assay, and inadequate number of viral copies (<131 copies/mL). A negative result must be combined with clinical observations, patient history, and epidemiological information. The expected result is Negative.  Fact Sheet for Patients:  PinkCheek.be  Fact Sheet for Healthcare Providers:  GravelBags.it  This test is no t yet approved or cleared by the [place] FDA and  has been authorized for detection and/or diagnosis of SARS-CoV-2 by FDA under an Emergency Use Authorization (EUA). This EUA will remain  in effect (meaning this test can be used) for the duration of the COVID-19 declaration under Section 564(b)(1) of the Act, 21 U.S.C. section 360bbb-3(b)(1), unless the authorization is terminated or revoked sooner.     Influenza A by PCR NEGATIVE NEGATIVE Final   Influenza B by PCR NEGATIVE NEGATIVE Final    Comment: (NOTE) The Xpert Xpress SARS-CoV-2/FLU/RSV assay is intended as an aid in  the diagnosis of influenza from Nasopharyngeal swab specimens and  should not be used as a sole basis for treatment. Nasal washings and  aspirates are unacceptable for Xpert Xpress SARS-CoV-2/FLU/RSV  testing.  Fact Sheet for Patients: PinkCheek.be  Fact Sheet for Healthcare Providers: GravelBags.it  This test is not yet approved or cleared by the [place] FDA and  has been authorized for detection and/or diagnosis of SARS-CoV-2 by  FDA under an Emergency Use Authorization (EUA). This EUA will remain  in effect (meaning this test can  be used) for the duration of the  Covid-19 declaration under Section 564(b)(1) of the Act, 21  U.S.C. section 360bbb-3(b)(1), unless the authorization is  terminated or revoked. Performed at [facility], [place] [address], [place], [place] [address]      Labs: BNP (last 3 results) Recent Labs    09/03/20 1618  BNP 683.4*   Basic Metabolic Panel: Recent Labs  Lab 09/04/20 0358 09/04/20 0909 09/04/20 1258 09/05/20 0043 09/06/20 0304  NA 138 136 139 139 136  K 3.7 3.6 3.9 3.7 3.4*  CL 109 106 107 111 107  CO2 20* 22 24 19* 20*  GLUCOSE 133* 162* 129* 161* 126*  BUN $Re'19 18 17 19 18  'ECA$ CREATININE 0.99 1.02* 0.95 1.08* 1.11*  CALCIUM 8.9 8.9 8.9 9.0 8.9   Liver Function Tests: Recent Labs  Lab 09/05/20 0043  AST 19  ALT 18  ALKPHOS 85  BILITOT 0.5  PROT 5.7*  ALBUMIN 2.6*   No results for input(s): LIPASE, AMYLASE in the last 168 hours. No results for input(s): AMMONIA in the last 168 hours. CBC: Recent Labs  Lab 09/03/20 0936 09/03/20 2207 09/04/20 0358 09/05/20 0043 09/06/20 0304  WBC 10.7*  --  9.0 9.9 8.2  HGB 13.6 14.3 12.5 12.9 12.4  HCT 41.1 42.0 37.9 38.7 38.2  MCV 84.2  --  82.4 83.0 83.6  PLT 155  --  159 168 174   Cardiac Enzymes: No results for input(s): CKTOTAL, CKMB, CKMBINDEX, TROPONINI in the last 168 hours. BNP: Invalid input(s): POCBNP CBG: Recent Labs  Lab 09/05/20 1208 09/05/20 1628 09/05/20 2100 09/06/20 0758 09/06/20 1156  GLUCAP 353* 176* 112* 152* 237*   D-Dimer No results for input(s): DDIMER in the last 72 hours. Hgb A1c No results for input(s): HGBA1C in the last 72 hours. Lipid Profile No results for input(s): CHOL, HDL,  LDLCALC, TRIG, CHOLHDL, LDLDIRECT in the last 72 hours. Thyroid function studies No results for input(s): TSH, T4TOTAL, T3FREE, THYROIDAB in the last 72 hours.  Invalid input(s): FREET3 Anemia work up No results for input(s): VITAMINB12, FOLATE, FERRITIN, TIBC, IRON, RETICCTPCT in the last 72  hours. Urinalysis    Component Value Date/Time   COLORURINE YELLOW 09/03/2020 1009   APPEARANCEUR HAZY (A) 09/03/2020 1009   LABSPEC 1.028 09/03/2020 1009   PHURINE 5.0 09/03/2020 1009   GLUCOSEU >=500 (A) 09/03/2020 1009   HGBUR SMALL (A) 09/03/2020 1009   BILIRUBINUR NEGATIVE 09/03/2020 1009   KETONESUR 20 (A) 09/03/2020 1009   PROTEINUR NEGATIVE 09/03/2020 1009   NITRITE NEGATIVE 09/03/2020 1009   LEUKOCYTESUR MODERATE (A) 09/03/2020 1009   Sepsis Labs Invalid input(s): PROCALCITONIN,  WBC,  LACTICIDVEN Microbiology Recent Results (from the past 240 hour(s))  Respiratory Panel by RT PCR (Flu A&B, Covid) - Nasopharyngeal Swab     Status: None   Collection Time: 09/03/20  4:23 PM   Specimen: Nasopharyngeal Swab  Result Value Ref Range Status   SARS Coronavirus 2 by RT PCR NEGATIVE NEGATIVE Final    Comment: (NOTE) SARS-CoV-2 target nucleic acids are NOT DETECTED.  The SARS-CoV-2 RNA is generally detectable in upper respiratoy specimens during the acute phase of infection. The lowest concentration of SARS-CoV-2 viral copies this assay can detect is 131 copies/mL. A negative result does not preclude SARS-Cov-2 infection and should not be used as the sole basis for treatment or other patient management decisions. A negative result may occur with  improper specimen collection/handling, submission of specimen other than nasopharyngeal swab, presence of viral mutation(s) within the areas targeted by this assay, and inadequate number of viral copies (<131 copies/mL). A negative result must be combined with clinical observations, patient history, and epidemiological information. The expected result is Negative.  Fact Sheet for Patients:  PinkCheek.be  Fact Sheet for Healthcare Providers:  GravelBags.it  This test is no t yet approved or cleared by the [place] FDA and  has been authorized for detection and/or  diagnosis of SARS-CoV-2 by FDA under an Emergency Use Authorization (EUA). This EUA will remain  in effect (meaning this test can be used) for the duration of the COVID-19 declaration under Section 564(b)(1) of the Act, 21 U.S.C. section 360bbb-3(b)(1), unless the authorization is terminated or revoked sooner.     Influenza A by PCR NEGATIVE NEGATIVE Final   Influenza B by PCR NEGATIVE NEGATIVE Final    Comment: (NOTE) The Xpert Xpress SARS-CoV-2/FLU/RSV assay is intended as an aid in  the diagnosis of influenza from Nasopharyngeal swab specimens and  should not be used as a sole basis for treatment. Nasal washings and  aspirates are unacceptable for Xpert Xpress SARS-CoV-2/FLU/RSV  testing.  Fact Sheet for Patients: PinkCheek.be  Fact Sheet for Healthcare Providers: GravelBags.it  This test is not yet approved or cleared by the [place] FDA and  has been authorized for detection and/or diagnosis of SARS-CoV-2 by  FDA under an Emergency Use Authorization (EUA). This EUA will remain  in effect (meaning this test can be used) for the duration of the  Covid-19 declaration under Section 564(b)(1) of the Act, 21  U.S.C. section 360bbb-3(b)(1), unless the authorization is  terminated or revoked. Performed at [facility], [place] [address] [place], [place] [address]    Time coordinating discharge: 35 minutes  SIGNED:  Kerney Elbe, DO [facility] 09/06/2020, 6:12 PM Pager is on [place]  If 7PM-7AM,  please contact night-coverage [email or web address]

## 2020-09-06 NOTE — Progress Notes (Signed)
Physical Therapy Treatment Patient Details Name: Carla Wells MRN: [id number] DOB: 06-23-1941 Today's Date: 09/06/2020    History of Present Illness Patient is a 79 y/o female who presents with SOB. Found to have acute PEs with large clot burden seen on CT scan with concern for right heart strain and DKA with new onset DM. PMh includes HTN, HLd, DM.    PT Comments    Pt mobilizing at mod I to independent level today, with no physical assist from PT needed during mobility. Pt with mild dyspnea on exertion, SpO2 91% and greater on RA and recovered well into upper 90s with rest and breathing technique. Pt met all PT goals, no more acute PT needs.   SATURATION QUALIFICATIONS: (This note is used to comply with regulatory documentation for home oxygen)  Patient Saturations on Room Air at Rest = 95%  Patient Saturations on Room Air while Ambulating = 91%  Patient Saturations on -- Liters of oxygen while Ambulating = --%  Please briefly explain why patient needs home oxygen: does not qualify for home O2    Follow Up Recommendations  No PT follow up;Supervision - Intermittent     Equipment Recommendations  None recommended by PT    Recommendations for Other Services       Precautions / Restrictions Precautions Precautions: None Restrictions Weight Bearing Restrictions: No    Mobility  Bed Mobility Overal bed mobility: Independent                Transfers Overall transfer level: Modified independent               General transfer comment: increased time to rise, no physical assist  Ambulation/Gait Ambulation/Gait assistance: Modified independent (Device/Increase time) Gait Distance (Feet): 350 Feet Assistive device: None Gait Pattern/deviations: Step-through pattern;Drifts right/left Gait velocity: slightly decr   General Gait Details: Mod I for increased time, drifting towards L occasionally requiring pt to re-direct walking path. SpO2 91% and greater on  RA during mobility, DOE 2/4 recovers with rest and breathing technique.   Stairs Stairs:  (deferred; standing marches to simulate stair navigation)           Wheelchair Mobility    Modified Rankin (Stroke Patients Only)       Balance Overall balance assessment: Needs assistance Sitting-balance support: Feet supported;No upper extremity supported Sitting balance-Leahy Scale: Good     Standing balance support: During functional activity Standing balance-Leahy Scale: Fair                              Cognition Arousal/Alertness: Awake/alert Behavior During Therapy: WFL for tasks assessed/performed Overall Cognitive Status: Within Functional Limits for tasks assessed                                        Exercises General Exercises - Lower Extremity Hip Flexion/Marching: AROM;Both;10 reps;Standing    General Comments        Pertinent Vitals/Pain Pain Assessment: No/denies pain    Home Living                      Prior Function            PT Goals (current goals can now be found in the care plan section) Acute Rehab PT Goals Patient Stated Goal: to get home and return to  work PT Goal Formulation: With patient Time For Goal Achievement: 09/19/20 Potential to Achieve Goals: Good Progress towards PT goals: Goals met/education completed, patient discharged from PT    Frequency    Min 3X/week      PT Plan Current plan remains appropriate    Co-evaluation              AM-PAC PT "6 Clicks" Mobility   Outcome Measure  Help needed turning from your back to your side while in a flat bed without using bedrails?: None Help needed moving from lying on your back to sitting on the side of a flat bed without using bedrails?: None Help needed moving to and from a bed to a chair (including a wheelchair)?: None Help needed standing up from a chair using your arms (e.g., wheelchair or bedside chair)?: None Help needed  to walk in hospital room?: None Help needed climbing 3-5 steps with a railing? : A Little 6 Click Score: 23    End of Session   Activity Tolerance: Patient tolerated treatment well Patient left: with call bell/phone within reach;in bed Nurse Communication: Mobility status PT Visit Diagnosis: Difficulty in walking, not elsewhere classified (R26.2);Unsteadiness on feet (R26.81)     Time: 4483-0159 PT Time Calculation (min) (ACUTE ONLY): 11 min  Charges:  $Gait Training: 8-22 mins                     Beverley Sherrard E, PT Acute Rehabilitation Services Pager [phone]  Office [phone]    Sarika Baldini D Elonda Husky 09/06/2020, 2:48 PM

## 2020-09-06 NOTE — Care Management Important Message (Signed)
Important Message  Patient Details  Name: Carla Wells MRN: [id number] Date of Birth: [birth year]   Medicare Important Message Given:  Yes     Ettamae Barkett [address] 09/06/2020, 2:37 PM

## 2020-09-07 ENCOUNTER — Other Ambulatory Visit: Payer: Self-pay

## 2020-09-07 NOTE — Patient Outreach (Addendum)
[name]) Care Management  [name]  09/07/2020   Carla Wells 24-Nov-1941 [id number]  Subjective: Telephone call to patient for follow up. Patient reports she is doing ok.  Her morning blood sugar was 312.  Patient reports taking her insulin this morning but took .8 as the insulin she got from the pharmacy she was instructed to take .8 of her 70/30. Patient has been trying to call Dr. Samella Parr  office for follow up and she has not gotten through but will call after CM call.  Patient reports that her diabetes is new for her and that she is adjusting to new diet.  Discussed things to eat to help mange her blood sugar.  Upon assessing patient I noted that patient would benefit from home health. Patient needs help with learning to monitor her sugars and basic one on one education about her diabetes.  Will call PCP office for that request.    79 y.o. 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  Patient recent A1c was 16.0.  Discussed goal of 7-8 but patient to discuss with physician.  Patient lives in the home with her 3 children and is independent with all her care and she still drives short distance such as to her PCP.  Daughter Carla Wells is her main caregiver at this time assisting patient.    Reviewed [facility] services and support. Patient is agreeable to services and gives CM permission to talk with daughter Carla Wells.    Telephone call to daughter Carla Wells.  Discussed concerns about patient diabetes management.  She states she checked patient blood sugar and made sure she had her insulin.  She stated the same thing about instruction on her insulin from the pharmacy that she was instructed to give .8 due to insulin she was given.  Discussed importance of follow up appointment with Dr. Samella Parr office to verify dosage.  She will make sure  patient sets appointment.  Also discussed home health working with patient as well. She is also agreeable as well.    Telephone call to Dr. Samella Parr office. Message left for his nurse about the ordering of home health for diabetes support.    Objective:   Encounter Medications:  Outpatient Encounter Medications as of 09/07/2020  Medication Sig  . albuterol (VENTOLIN HFA) 108 (90 Base) MCG/ACT inhaler Inhale 2 puffs into the lungs every 4 (four) hours as needed for wheezing or shortness of breath.  Marland Kitchen amLODipine (NORVASC) 10 MG tablet TAKE 1 TABLET EVERY DAY (Patient taking differently: Take 10 mg by mouth daily. )  . [START ON 09/12/2020] apixaban (ELIQUIS) 5 MG TABS tablet Take 2 tablets (10 mg total) by mouth 2 (two) times daily for 7 days, THEN 1 tablet (5 mg total) 2 (two) times daily for 28 days.  Marland Kitchen aspirin EC 81 MG tablet Take 81 mg by mouth daily. Swallow whole.  . blood glucose meter kit and supplies KIT Dispense based on patient and insurance preference. Use up to four times daily as directed. (FOR ICD-9 250.00, 250.01).  . Calcium Citrate-Vitamin D (CALCIUM + D PO) Take 1 tablet by mouth daily.  . Cholecalciferol (VITAMIN D3 PO) Take 1 tablet by mouth daily.  . insulin aspart protamine - aspart (NOVOLOG 70/30 FLEXPEN RELION) (70-30) 100 UNIT/ML FlexPen Inject 0.16 mLs (16 Units total) into the skin 2 (two) times daily.  . Insulin  Pen Needle 32G X 4 MM MISC 1 Container by Does not apply route in the morning and at bedtime.  Marland Kitchen latanoprost (XALATAN) 0.005 % ophthalmic solution Place 1 drop into both eyes at bedtime.   Marland Kitchen losartan-hydrochlorothiazide (HYZAAR) 50-12.5 MG tablet TAKE 1 TABLET EVERY DAY (Patient taking differently: Take 1 tablet by mouth daily. )  . MITIGARE 0.6 MG CAPS 2 tablets immediately then repeat 1 tablet 0.6 mg in 1 hour if pain persists.  Repeat this process daily until better (Patient taking differently: Take 0.6-1.2 mg by mouth See admin instructions. Take 2 tablets  (1.2 mg) immediately at onset of gout flare, then repeat 1 tablet (0.6 mg) in 1 hour if pain persists.  Repeat this process daily until better)  . simvastatin (ZOCOR) 10 MG tablet TAKE 1 TABLET AT BEDTIME (Patient taking differently: Take 10 mg by mouth at bedtime. )  . omeprazole (PRILOSEC) 40 MG capsule TAKE 1 CAPSULE EVERY DAY (Patient not taking: Reported on 09/07/2020)   No facility-administered encounter medications on file as of 09/07/2020.    Functional Status:  In your present state of health, do you have any difficulty performing the following activities: 09/07/2020 09/04/2020  Hearing? N N  Vision? N N  Difficulty concentrating or making decisions? N N  Walking or climbing stairs? N N  Dressing or bathing? N N  Doing errands, shopping? N N  Preparing Food and eating ? N -  Using the Toilet? N -  In the past six months, have you accidently leaked urine? N -  Do you have problems with loss of bowel control? N -  Managing your Medications? N -  Managing your Finances? N -  Housekeeping or managing your Housekeeping? N -  Some recent data might be hidden    Fall/Depression Screening: Fall Risk  09/07/2020 01/31/2020 01/19/2019  Falls in the past year? 0 0 0  Injury with Fall? - - 0  Follow up - Falls evaluation completed Falls evaluation completed   PHQ 2/9 Scores 09/07/2020 01/31/2020 01/19/2019 10/08/2018 09/17/2018 09/17/2018 09/09/2018  PHQ - 2 Score 0 0 0 0 0 0 0  PHQ- 9 Score - - - - - - -   SDOH Screenings   Alcohol Screen:   . Last Alcohol Screening Score (AUDIT): Not on file  Depression (PHQ2-9): Low Risk   . PHQ-2 Score: 0  Financial Resource Strain:   . Difficulty of Paying Living Expenses: Not on file  Food Insecurity:   . Worried About Charity fundraiser in the Last Year: Not on file  . Ran Out of Food in the Last Year: Not on file  Housing: Low Risk   . Last Housing Risk Score: 0  Physical Activity:   . Days of Exercise per Week: Not on file  . Minutes of  Exercise per Session: Not on file  Social Connections:   . Frequency of Communication with Friends and Family: Not on file  . Frequency of Social Gatherings with Friends and Family: Not on file  . Attends Religious Services: Not on file  . Active Member of Clubs or Organizations: Not on file  . Attends Archivist Meetings: Not on file  . Marital Status: Not on file  Stress:   . Feeling of Stress : Not on file  Tobacco Use: Medium Risk  . Smoking Tobacco Use: Never Smoker  . Smokeless Tobacco Use: Former Soil scientist Needs: No Transportation Needs  . Lack of Transportation (Medical): No  .  Lack of Transportation (Non-Medical): No    Assessment: Patient with new onset diabetes.  Main goal is to manage and monitor sugars to lower her blood sugars and decrease chances of complications.   Goals    . Make and Keep All Appointments     Follow Up Date 09/30/20   - keep a calendar with appointment dates    Why is this important?   Part of staying healthy is seeing the doctor for follow-up care.  If you forget your appointments, there are some things you can do to stay on track.    Notes: Patient to call PCP for follow up appointment.     . Monitor and Manage My Blood Sugar     Follow Up Date 09/30/20   - check blood sugar at prescribed times - check blood sugar if I feel it is too high or too low - enter blood sugar readings and medication or insulin into daily log - take the blood sugar log to all doctor visits    Why is this important?   Checking your blood sugar at home helps to keep it from getting very high or very low.  Writing the results in a diary or log helps the doctor know how to care for you.  Your blood sugar log should have the time, date and the results.  Also, write down the amount of insulin or other medicine that you take.  Other information, like what you ate, exercise done and how you were feeling, will also be helpful.     Notes:     .  Set My Target A1C     Follow Up Date 12/31/20 - set target A1C    Why is this important?   Your target A1C is decided together by you and your doctor.  It is based on several things like your age and other health issues.    Notes: Goal of 7-8        Plan: RN CM will provide ongoing education and support to patient through phone calls.   RN CM will send welcome packet with consent to patient.   RN CM will send initial barriers letter, assessment, and care plan to primary care physician.   RN CM will contact patient next week and patient agrees to next contact.    Jone Baseman, RN, MSN [facility] Care Management Coordinator Direct Line [phone] Cell [phone] Toll Free: [phone]  Fax: [phone]

## 2020-09-11 ENCOUNTER — Ambulatory Visit (INDEPENDENT_AMBULATORY_CARE_PROVIDER_SITE_OTHER): Payer: Medicare HMO | Admitting: Family Medicine

## 2020-09-11 ENCOUNTER — Other Ambulatory Visit: Payer: Self-pay

## 2020-09-11 ENCOUNTER — Telehealth: Payer: Self-pay

## 2020-09-11 VITALS — BP 130/60 | HR 94 | Temp 97.5°F | Ht 64.0 in | Wt 190.0 lb

## 2020-09-11 DIAGNOSIS — E1110 Type 2 diabetes mellitus with ketoacidosis without coma: Secondary | ICD-10-CM | POA: Diagnosis not present

## 2020-09-11 DIAGNOSIS — D6859 Other primary thrombophilia: Secondary | ICD-10-CM

## 2020-09-11 DIAGNOSIS — Z23 Encounter for immunization: Secondary | ICD-10-CM | POA: Diagnosis not present

## 2020-09-11 DIAGNOSIS — N1831 Chronic kidney disease, stage 3a: Secondary | ICD-10-CM | POA: Diagnosis not present

## 2020-09-11 DIAGNOSIS — I2699 Other pulmonary embolism without acute cor pulmonale: Secondary | ICD-10-CM | POA: Diagnosis not present

## 2020-09-11 NOTE — Telephone Encounter (Signed)
Dionne from [facility] called and stated [name] would really benefit from [facility]. She's been communicating with her, she thought the hospital would have done it but they did'nt.

## 2020-09-11 NOTE — Patient Outreach (Addendum)
[place] [facility]) Care Management  [place]  09/11/2020   Carla Wells 08-13-1941 [id number]  Subjective: Telephone call to patient for follow up. She reports she is doing good.  Patient to follow up with PCP today.  No problems with shortness of breath.  She reports she is taking her Eliquis. Discussed importance of continuing her eliquis.  Patient reports sugars are better with this morning reading 198.  Discussed insulin dosing and she states she is taking 16 units twice a day.  Patient reports she does check her sugars but sometimes her daughter does it for her.   Discussed diet and control of blood sugar.  She verbalized understanding and patient agreeable.   EMMI- General Discharge RED ON EMMI ALERT Day # 1 Date: 09/08/20 Red Alert Reason:  Questions about discharge papers? Yes  Know who to call about changes in condition? No    Addressed red alerts with patient.  She denies an problems.    Telephone call to Dr. Samella Parr office for possible home health request.  Message sent to MD for today's appointment.    Objective:   Encounter Medications:  Outpatient Encounter Medications as of 09/11/2020  Medication Sig  . albuterol (VENTOLIN HFA) 108 (90 Base) MCG/ACT inhaler Inhale 2 puffs into the lungs every 4 (four) hours as needed for wheezing or shortness of breath.  Marland Kitchen amLODipine (NORVASC) 10 MG tablet TAKE 1 TABLET EVERY DAY (Patient taking differently: Take 10 mg by mouth daily. )  . [START ON 09/12/2020] apixaban (ELIQUIS) 5 MG TABS tablet Take 2 tablets (10 mg total) by mouth 2 (two) times daily for 7 days, THEN 1 tablet (5 mg total) 2 (two) times daily for 28 days.  Marland Kitchen aspirin EC 81 MG tablet Take 81 mg by mouth daily. Swallow whole.  . blood glucose meter kit and supplies KIT Dispense based on patient and insurance preference. Use up to four times daily as directed. (FOR ICD-9 250.00, 250.01).  . Calcium Citrate-Vitamin D (CALCIUM + D PO) Take 1 tablet  by mouth daily.  . Cholecalciferol (VITAMIN D3 PO) Take 1 tablet by mouth daily.  . insulin aspart protamine - aspart (NOVOLOG 70/30 FLEXPEN RELION) (70-30) 100 UNIT/ML FlexPen Inject 0.16 mLs (16 Units total) into the skin 2 (two) times daily.  . Insulin Pen Needle 32G X 4 MM MISC 1 Container by Does not apply route in the morning and at bedtime.  Marland Kitchen latanoprost (XALATAN) 0.005 % ophthalmic solution Place 1 drop into both eyes at bedtime.   Marland Kitchen losartan-hydrochlorothiazide (HYZAAR) 50-12.5 MG tablet TAKE 1 TABLET EVERY DAY (Patient taking differently: Take 1 tablet by mouth daily. )  . MITIGARE 0.6 MG CAPS 2 tablets immediately then repeat 1 tablet 0.6 mg in 1 hour if pain persists.  Repeat this process daily until better (Patient taking differently: Take 0.6-1.2 mg by mouth See admin instructions. Take 2 tablets (1.2 mg) immediately at onset of gout flare, then repeat 1 tablet (0.6 mg) in 1 hour if pain persists.  Repeat this process daily until better)  . omeprazole (PRILOSEC) 40 MG capsule TAKE 1 CAPSULE EVERY DAY (Patient not taking: Reported on 09/07/2020)  . simvastatin (ZOCOR) 10 MG tablet TAKE 1 TABLET AT BEDTIME (Patient taking differently: Take 10 mg by mouth at bedtime. )   No facility-administered encounter medications on file as of 09/11/2020.    Functional Status:  In your present state of health, do you have any difficulty performing the following activities: 09/07/2020  09/04/2020  Hearing? N N  Vision? N N  Difficulty concentrating or making decisions? N N  Walking or climbing stairs? N N  Dressing or bathing? N N  Doing errands, shopping? N N  Preparing Food and eating ? N -  Using the Toilet? N -  In the past six months, have you accidently leaked urine? N -  Do you have problems with loss of bowel control? N -  Managing your Medications? N -  Managing your Finances? N -  Housekeeping or managing your Housekeeping? N -  Some recent data might be hidden    Fall/Depression  Screening: Fall Risk  09/07/2020 01/31/2020 01/19/2019  Falls in the past year? 0 0 0  Injury with Fall? - - 0  Follow up - Falls evaluation completed Falls evaluation completed   PHQ 2/9 Scores 09/07/2020 01/31/2020 01/19/2019 10/08/2018 09/17/2018 09/17/2018 09/09/2018  PHQ - 2 Score 0 0 0 0 0 0 0  PHQ- 9 Score - - - - - - -    Assessment:  Patient managing new diabetes diagnosis with help of family. Patient needs continuing education for optimal management.  Goals Addressed            This Visit's Progress   . Make and Keep All Appointments   On track    Follow Up Date 09/30/20   - keep a calendar with appointment dates    Why is this important?   Part of staying healthy is seeing the doctor for follow-up care.  If you forget your appointments, there are some things you can do to stay on track.    Notes: Patient has appointment 09/11/20 with PCP    . Monitor and Manage My Blood Sugar   On track    Follow Up Date 09/30/20   - check blood sugar at prescribed times - check blood sugar if I feel it is too high or too low - enter blood sugar readings and medication or insulin into daily log - take the blood sugar log to all doctor visits    Why is this important?   Checking your blood sugar at home helps to keep it from getting very high or very low.  Writing the results in a diary or log helps the doctor know how to care for you.  Your blood sugar log should have the time, date and the results.  Also, write down the amount of insulin or other medicine that you take.  Other information, like what you ate, exercise done and how you were feeling, will also be helpful.     Notes: Patient checking sugars herself most times.     . Set My Target A1C   On track    Follow Up Date 12/31/20 - set target A1C    Why is this important?   Your target A1C is decided together by you and your doctor.  It is based on several things like your age and other health issues.    Notes: Goal of 7-8        Plan:  RN CM will contact patient again in the month of October and patient agrees to next outreach.  Jone Baseman, RN, MSN [place] [facility] Care Management Coordinator Direct Line [phone] Cell [phone] Toll Free: [phone]  Fax: [phone]

## 2020-09-12 LAB — CBC WITH DIFFERENTIAL/PLATELET
Absolute Monocytes: 1051 cells/uL — ABNORMAL HIGH (ref 200–950)
Basophils Absolute: 52 cells/uL (ref 0–200)
Basophils Relative: 0.7 %
Eosinophils Absolute: 89 cells/uL (ref 15–500)
Eosinophils Relative: 1.2 %
HCT: 40.8 % (ref 35.0–45.0)
Hemoglobin: 13.3 g/dL (ref 11.7–15.5)
Lymphs Abs: 2072 cells/uL (ref 850–3900)
MCH: 28.1 pg (ref 27.0–33.0)
MCHC: 32.6 g/dL (ref 32.0–36.0)
MCV: 86.3 fL (ref 80.0–100.0)
MPV: 12.3 fL (ref 7.5–12.5)
Monocytes Relative: 14.2 %
Neutro Abs: 4137 cells/uL (ref 1500–7800)
Neutrophils Relative %: 55.9 %
Platelets: 308 10*3/uL (ref 140–400)
RBC: 4.73 10*6/uL (ref 3.80–5.10)
RDW: 13.7 % (ref 11.0–15.0)
Total Lymphocyte: 28 %
WBC: 7.4 10*3/uL (ref 3.8–10.8)

## 2020-09-12 LAB — COMPLETE METABOLIC PANEL WITH GFR
AG Ratio: 1.1 (calc) (ref 1.0–2.5)
ALT: 28 U/L (ref 6–29)
AST: 28 U/L (ref 10–35)
Albumin: 3.6 g/dL (ref 3.6–5.1)
Alkaline phosphatase (APISO): 94 U/L (ref 37–153)
BUN/Creatinine Ratio: 12 (calc) (ref 6–22)
BUN: 16 mg/dL (ref 7–25)
CO2: 25 mmol/L (ref 20–32)
Calcium: 9.8 mg/dL (ref 8.6–10.4)
Chloride: 102 mmol/L (ref 98–110)
Creat: 1.39 mg/dL — ABNORMAL HIGH (ref 0.60–0.93)
GFR, Est African American: 42 mL/min/{1.73_m2} — ABNORMAL LOW (ref >=60)
GFR, Est Non African American: 36 mL/min/{1.73_m2} — ABNORMAL LOW (ref >=60)
Globulin: 3.2 g/dL (calc) (ref 1.9–3.7)
Glucose, Bld: 145 mg/dL — ABNORMAL HIGH (ref 65–99)
Potassium: 4 mmol/L (ref 3.5–5.3)
Sodium: 136 mmol/L (ref 135–146)
Total Bilirubin: 0.4 mg/dL (ref 0.2–1.2)
Total Protein: 6.8 g/dL (ref 6.1–8.1)

## 2020-09-12 NOTE — Progress Notes (Signed)
Subjective:    Patient ID: Carla Wells, female    DOB: [birth year], 79 y.o.   MRN: [id number]  HPI Unfortunately, the patient was recently admitted with shortness of breath and was found to have multiple pulmonary emboli.  I have copied the discharge summary below: Admit date: 09/03/2020 Discharge date: 09/06/2020  Admitted From: Home Disposition:  Home  Recommendations for Outpatient Follow-up:  1. Follow up with PCP in 1-2 weeks 2. Follow up with Diabetes Education Coordinator within 1-2 weeks 3. Please obtain CMP/CBC, Mag, Phos in one week 4. Please follow up on the following pending results:  Home Health: No Equipment/Devices: None recommended by PT/OT  Discharge Condition: Stable CODE STATUS: FULL CODE Diet recommendation: Heart Healthy Carb Modified Diet   Brief/Interim Summary: The patient is a 79 year old obese African-American female with a past medical history significant for but not limited to hypertension, hyperlipidemia, chronic kidney disease stage III who came to the hospital with shortness of breath.  Shortness of breath have been going on for the past month and was worse last few days and she came to the emergency room.  CT angiogram showed PEs.  She is also found to be in DKA with new onset of diabetes mellitus type 2.  Hemoglobin A1c was 16.  Her blood sugars improved after she was transitioned to insulin and she tolerated heparin drip well and was transitioned to p.o. anticoagulation with apixaban.  We will give her free 30-day supply of apixaban and she was educated by the diabetes education coordinator on insulin administration and she will be discharged on NovoLog 70/30 given the prohibitive expense of the Lantus and Levemir.  Currently she is medically stable to be discharged as she did not desaturate on amatory home O2 screen and blood sugars have been fairly well controlled since that insulin regimen has been adjusted.  She will need to follow-up with PCP  within 1 week.  Discharge Diagnoses:  Principal Problem:   Acute pulmonary embolus (HCC) Active Problems:   Hypertension associated with diabetes ([place])   Diabetic ketoacidosis without coma associated with type 2 diabetes mellitus ([place])   Acute kidney injury superimposed on CKD ([place])   Hyperlipidemia associated with type 2 diabetes mellitus ([place])  Acute pulmonary Embolus -Large clot burden seen on CT scan with concern for right heart strain. No obvious provoking factors will stop PCCM evaluated patient but did not recommend lysis. -2D echo was done, showed EF 37-62%, grade 1 diastolic dysfunction, RV systolic function is normal with normal RV size.  -She has tolerated heparin well, discussed with patient this morning risks benefits of oral anticoagulation and per preference will start Eliquis. Case manager assisted with medication and she will get a free 30 day Supply -No DVT on LE Duplex -C/w Apixaban 10 mg po BID x7 days then 5 mg po BID -Did not Desaturate on Home O2 Screen   DKA, Type 2 diabetes mellitus-patient denies history of diabetes mellitus.She was initially started on insulin infusion, her gap is closed and was placed on Lantus 18 units as well as sliding scale. Fasting this morning is 210, gradually increasing towards lunch. Dr. Cruzita Lederer  added 5 units scheduled NovoLog and increase Lantus. Reviewed care management note, it appears that Lantus and Levemir are quite expensive however NovoLog was relatively well covered, and patient would probably benefit from NPH twice daily on discharge along with a NovoLog sliding scale. Have asked RN to do teaching for CBG check as well as insulin administration, awaiting  diabetes educator. Suspect may be able to be discharged today since CBG's are better -Will discharge on Novolog 70/30 16 units BID at the Diabetes Education Coordinator Recc's -Will need to follow up with Diabetes Education Coordinator within 1-2 weeks    Elevated troponin -no chest pain, this is likely demand ischemia due to large PE.  -Trending down with last Troponin I of 216  Acute kidney injury chronic kidney disease stage IIIa Metabolic Acidosis -Baseline creatinine around 1.1-1.2, up to 1.5 on admission, improved with fluids -BUN/Cr is now 18/1.11; Metabolic Acidosis is mild with a CO2 of 20, anion gap of 9, chloride level 175 rectally monitor and trend and repeat CMP in the outpatient setting  Hypokalemia -Mild at 3.4 -Replete with po KCl 40 mEQ BID x2 -Continue to Monitor and Replete as Necessary -Repeat CMP within 1 week  Essential hypertension -currently normotensive -Resume Home Amlodipine 10 mg po Daily   Hyperlipidemia -continue statin  Obesity -Complicates care and overall prognosis -Estimated body mass index is 35.08 kg/m as calculated from the following:   Height as of this encounter: $RemoveBeforeD'5\' 4"'[id number]$  (1.626 m).   Weight as of this encounter: 92.7 kg. -Continued weight loss and dietary counseling  09/12/20 Patient is here today for follow-up.  My first concern is why did the patient form pulmonary emboli.  She has not had any recent surgery.  She has not had any recent immobilization or prolonged plane flight, etc.  She has no history of coagulopathy.  There are no family histories of DVT or PE.  She has never had a DVT before.  Patient did have Covid in December however that was 10 months ago.  That is her only hypercoagulable risk factor.  She is tolerating Eliquis well with no complications.  She denies any bleeding or bruising.  She has not been checking her sugars because she does not know how to use her meter.  I have my nurse educate the patient on how to use her meter and make sure she was comfortable performing sugar checks.  Today in the office her 2-hour postprandial sugar is excellent at 158.  She is currently taking 16 units of NPH twice a day.  She denies any symptoms of hypoglycemia.  She denies any  polyuria, polydipsia, or blurry vision. Past Medical History:  Diagnosis Date  . Abnormal pap   . Cataract   . Colon polyps   . Diabetes mellitus without complication ([place])   . Hyperlipidemia   . Hypertension   . Murmur    No past surgical history on file. Current Outpatient Medications on File Prior to Visit  Medication Sig Dispense Refill  . albuterol (VENTOLIN HFA) 108 (90 Base) MCG/ACT inhaler Inhale 2 puffs into the lungs every 4 (four) hours as needed for wheezing or shortness of breath. 6.7 g 0  . amLODipine (NORVASC) 10 MG tablet TAKE 1 TABLET EVERY DAY (Patient taking differently: Take 10 mg by mouth daily. ) 90 tablet 1  . apixaban (ELIQUIS) 5 MG TABS tablet Take 2 tablets (10 mg total) by mouth 2 (two) times daily for 7 days, THEN 1 tablet (5 mg total) 2 (two) times daily for 28 days. 84 tablet 0  . aspirin EC 81 MG tablet Take 81 mg by mouth daily. Swallow whole.    . blood glucose meter kit and supplies KIT Dispense based on patient and insurance preference. Use up to four times daily as directed. (FOR ICD-9 250.00, 250.01). 1 each 0  . Calcium  Citrate-Vitamin D (CALCIUM + D PO) Take 1 tablet by mouth daily.    . Cholecalciferol (VITAMIN D3 PO) Take 1 tablet by mouth daily.    . insulin aspart protamine - aspart (NOVOLOG 70/30 FLEXPEN RELION) (70-30) 100 UNIT/ML FlexPen Inject 0.16 mLs (16 Units total) into the skin 2 (two) times daily. 15 mL 11  . Insulin Pen Needle 32G X 4 MM MISC 1 Container by Does not apply route in the morning and at bedtime. 100 each 0  . latanoprost (XALATAN) 0.005 % ophthalmic solution Place 1 drop into both eyes at bedtime.     Marland Kitchen losartan-hydrochlorothiazide (HYZAAR) 50-12.5 MG tablet TAKE 1 TABLET EVERY DAY (Patient taking differently: Take 1 tablet by mouth daily. ) 90 tablet 3  . MITIGARE 0.6 MG CAPS 2 tablets immediately then repeat 1 tablet 0.6 mg in 1 hour if pain persists.  Repeat this process daily until better (Patient taking differently: Take  0.6-1.2 mg by mouth See admin instructions. Take 2 tablets (1.2 mg) immediately at onset of gout flare, then repeat 1 tablet (0.6 mg) in 1 hour if pain persists.  Repeat this process daily until better) 90 capsule 2  . simvastatin (ZOCOR) 10 MG tablet TAKE 1 TABLET AT BEDTIME (Patient taking differently: Take 10 mg by mouth at bedtime. ) 90 tablet 1  . omeprazole (PRILOSEC) 40 MG capsule TAKE 1 CAPSULE EVERY DAY (Patient not taking: Reported on 09/07/2020) 90 capsule 3   No current facility-administered medications on file prior to visit.   Allergies  Allergen Reactions  . Penicillins Swelling    Facial swelling   Social History   Socioeconomic History  . Marital status: Single    Spouse name: Not on file  . Number of children: Not on file  . Years of education: Not on file  . Highest education level: Not on file  Occupational History  . Not on file  Tobacco Use  . Smoking status: Never Smoker  . Smokeless tobacco: Former Systems developer    Types: Snuff  Substance and Sexual Activity  . Alcohol use: No  . Drug use: No  . Sexual activity: Not on file  Other Topics Concern  . Not on file  Social History Narrative  . Not on file   Social Determinants of Health   Financial Resource Strain:   . Difficulty of Paying Living Expenses: Not on file  Food Insecurity:   . Worried About Charity fundraiser in the Last Year: Not on file  . Ran Out of Food in the Last Year: Not on file  Transportation Needs: No Transportation Needs  . Lack of Transportation (Medical): No  . Lack of Transportation (Non-Medical): No  Physical Activity:   . Days of Exercise per Week: Not on file  . Minutes of Exercise per Session: Not on file  Stress:   . Feeling of Stress : Not on file  Social Connections:   . Frequency of Communication with Friends and Family: Not on file  . Frequency of Social Gatherings with Friends and Family: Not on file  . Attends Religious Services: Not on file  . Active Member of  Clubs or Organizations: Not on file  . Attends Archivist Meetings: Not on file  . Marital Status: Not on file  Intimate Partner Violence:   . Fear of Current or Ex-Partner: Not on file  . Emotionally Abused: Not on file  . Physically Abused: Not on file  . Sexually Abused: Not on file  Review of Systems  All other systems reviewed and are negative.      Objective:   Physical Exam Vitals reviewed.  Constitutional:      General: She is not in acute distress.    Appearance: Normal appearance. She is normal weight. She is not ill-appearing or toxic-appearing.  Cardiovascular:     Rate and Rhythm: Normal rate and regular rhythm.     Heart sounds: Normal heart sounds. No murmur heard.  No gallop.   Pulmonary:     Effort: Pulmonary effort is normal. No respiratory distress.     Breath sounds: Normal breath sounds. No stridor. No wheezing, rhonchi or rales.  Abdominal:     General: Abdomen is flat. Bowel sounds are normal. There is no distension.     Palpations: Abdomen is soft.     Tenderness: There is no abdominal tenderness. There is no guarding or rebound.  Musculoskeletal:     Right lower leg: No edema.     Left lower leg: No edema.  Neurological:     General: No focal deficit present.     Mental Status: She is alert and oriented to person, place, and time. Mental status is at baseline.     Cranial Nerves: No cranial nerve deficit.     Motor: No weakness.     Coordination: Coordination normal.     Gait: Gait normal.           Assessment & Plan:  Pulmonary embolism and infarction (HCC) - Plan: CBC with Differential/Platelet, COMPLETE METABOLIC PANEL WITH GFR  Need for immunization against influenza - Plan: Flu Vaccine QUAD High Dose(Fluad)  Stage 3a chronic kidney disease ([place])  Diabetic ketoacidosis without coma associated with type 2 diabetes mellitus ([place])  Hypercoagulable state ([place])  First regarding her treatment of pulmonary emboli.  I will  check a CBC to evaluate for any drop in her hemoglobin since starting Eliquis.  Continue Eliquis as prescribed and after 10 days transition to 5 mg twice daily to complete 6 months of therapy.  However I feel that we need to start a hypercoagulable work-up.  Given her age, I do not believe she has an inherited hyper coagulable state such as factor V Leiden.  Therefore I do not feel that she requires this lab work.  I am concerned about possible occult malignancy.  Therefore I will schedule the patient for a CT scan of the abdomen and pelvis.  She is already had a CT angiogram of the chest which was negative for any underlying lung cancer etc.  Mammogram was done in June and was normal.  I will schedule the patient for a Cologuard as she has never had a colonoscopy.  We will do a Cologuard because I do not want to stop her anticoagulant unless absolutely necessary.  Regarding her diabetes, the patient will check her fasting blood sugar and her 2-hour postprandial sugars for the next week.  I will see the patient back next week and we will review the readings.  If the patient's fasting blood sugars are under 130 and her 2-hour postprandial sugars are under 160 on just 32 units total of insulin, we may try to transition the patient to oral medication gradually and wean her off insulin.  Recheck next week

## 2020-09-13 ENCOUNTER — Ambulatory Visit: Payer: Medicare HMO

## 2020-09-18 ENCOUNTER — Other Ambulatory Visit: Payer: Self-pay

## 2020-09-18 ENCOUNTER — Ambulatory Visit (INDEPENDENT_AMBULATORY_CARE_PROVIDER_SITE_OTHER): Payer: Medicare HMO | Admitting: Family Medicine

## 2020-09-18 VITALS — BP 106/50 | HR 86 | Temp 97.6°F | Ht 64.0 in | Wt 189.6 lb

## 2020-09-18 DIAGNOSIS — E1165 Type 2 diabetes mellitus with hyperglycemia: Secondary | ICD-10-CM

## 2020-09-18 DIAGNOSIS — I2699 Other pulmonary embolism without acute cor pulmonale: Secondary | ICD-10-CM

## 2020-09-18 DIAGNOSIS — N1831 Chronic kidney disease, stage 3a: Secondary | ICD-10-CM

## 2020-09-18 MED ORDER — METFORMIN HCL 500 MG PO TABS: 1000.0000 mg | ORAL_TABLET | Freq: Two times a day (BID) | ORAL | 3 refills | Status: DC

## 2020-09-18 NOTE — Patient Instructions (Signed)
° ° ° °  If you have lab work done today you will be contacted with your lab results within the next 2 weeks.  If you have not heard from us then please contact us. The fastest way to get your results is to register for My Chart. ° ° °IF you received an x-ray today, you will receive an invoice from [facility]. Please contact [facility] at [phone] with questions or concerns regarding your invoice.  ° °IF you received labwork today, you will receive an invoice from [facility]. Please contact [facility] at [phone] with questions or concerns regarding your invoice.  ° °Our billing staff will not be able to assist you with questions regarding bills from these companies. ° °You will be contacted with the lab results as soon as they are available. The fastest way to get your results is to activate your My Chart account. Instructions are located on the last page of this paperwork. If you have not heard from us regarding the results in 2 weeks, please contact this office. °  ° ° ° °

## 2020-09-18 NOTE — Progress Notes (Signed)
Subjective:    Patient ID: Carla Wells, female    DOB: [birth year], 79 y.o.   MRN: [id number]  HPI Unfortunately, the patient was recently admitted with shortness of breath and was found to have multiple pulmonary emboli.  I have copied the discharge summary below: Admit date: 09/03/2020 Discharge date: 09/06/2020  Admitted From: Home Disposition:  Home  Recommendations for Outpatient Follow-up:  1. Follow up with PCP in 1-2 weeks 2. Follow up with Diabetes Education Coordinator within 1-2 weeks 3. Please obtain CMP/CBC, Mag, Phos in one week 4. Please follow up on the following pending results:  Home Health: No Equipment/Devices: None recommended by PT/OT  Discharge Condition: Stable CODE STATUS: FULL CODE Diet recommendation: Heart Healthy Carb Modified Diet   Brief/Interim Summary: The patient is a 79 year old obese African-American female with a past medical history significant for but not limited to hypertension, hyperlipidemia, chronic kidney disease stage III who came to the hospital with shortness of breath.  Shortness of breath have been going on for the past month and was worse last few days and she came to the emergency room.  CT angiogram showed PEs.  She is also found to be in DKA with new onset of diabetes mellitus type 2.  Hemoglobin A1c was 16.  Her blood sugars improved after she was transitioned to insulin and she tolerated heparin drip well and was transitioned to p.o. anticoagulation with apixaban.  We will give her free 30-day supply of apixaban and she was educated by the diabetes education coordinator on insulin administration and she will be discharged on NovoLog 70/30 given the prohibitive expense of the Lantus and Levemir.  Currently she is medically stable to be discharged as she did not desaturate on amatory home O2 screen and blood sugars have been fairly well controlled since that insulin regimen has been adjusted.  She will need to follow-up with PCP  within 1 week.  Discharge Diagnoses:  Principal Problem:   Acute pulmonary embolus (HCC) Active Problems:   Hypertension associated with diabetes ([place])   Diabetic ketoacidosis without coma associated with type 2 diabetes mellitus ([place])   Acute kidney injury superimposed on CKD ([place])   Hyperlipidemia associated with type 2 diabetes mellitus ([place])  Acute pulmonary Embolus -Large clot burden seen on CT scan with concern for right heart strain. No obvious provoking factors will stop PCCM evaluated patient but did not recommend lysis. -2D echo was done, showed EF 37-62%, grade 1 diastolic dysfunction, RV systolic function is normal with normal RV size.  -She has tolerated heparin well, discussed with patient this morning risks benefits of oral anticoagulation and per preference will start Eliquis. Case manager assisted with medication and she will get a free 30 day Supply -No DVT on LE Duplex -C/w Apixaban 10 mg po BID x7 days then 5 mg po BID -Did not Desaturate on Home O2 Screen   DKA, Type 2 diabetes mellitus-patient denies history of diabetes mellitus.She was initially started on insulin infusion, her gap is closed and was placed on Lantus 18 units as well as sliding scale. Fasting this morning is 210, gradually increasing towards lunch. Dr. Cruzita Lederer  added 5 units scheduled NovoLog and increase Lantus. Reviewed care management note, it appears that Lantus and Levemir are quite expensive however NovoLog was relatively well covered, and patient would probably benefit from NPH twice daily on discharge along with a NovoLog sliding scale. Have asked RN to do teaching for CBG check as well as insulin administration, awaiting  diabetes educator. Suspect may be able to be discharged today since CBG's are better -Will discharge on Novolog 70/30 16 units BID at the Diabetes Education Coordinator Recc's -Will need to follow up with Diabetes Education Coordinator within 1-2 weeks    Elevated troponin -no chest pain, this is likely demand ischemia due to large PE.  -Trending down with last Troponin I of 216  Acute kidney injury chronic kidney disease stage IIIa Metabolic Acidosis -Baseline creatinine around 1.1-1.2, up to 1.5 on admission, improved with fluids -BUN/Cr is now 18/1.11; Metabolic Acidosis is mild with a CO2 of 20, anion gap of 9, chloride level 175 rectally monitor and trend and repeat CMP in the outpatient setting  Hypokalemia -Mild at 3.4 -Replete with po KCl 40 mEQ BID x2 -Continue to Monitor and Replete as Necessary -Repeat CMP within 1 week  Essential hypertension -currently normotensive -Resume Home Amlodipine 10 mg po Daily   Hyperlipidemia -continue statin  Obesity -Complicates care and overall prognosis -Estimated body mass index is 35.08 kg/m as calculated from the following:   Height as of this encounter: _0  (1.626 m).   Weight as of this encounter: 92.7 kg. -Continued weight loss and dietary counseling  09/12/20 Patient is here today for follow-up.  My first concern is why did the patient form pulmonary emboli.  She has not had any recent surgery.  She has not had any recent immobilization or prolonged plane flight, etc.  She has no history of coagulopathy.  There are no family histories of DVT or PE.  She has never had a DVT before.  Patient did have Covid in December however that was 10 months ago.  That is her only hypercoagulable risk factor.  She is tolerating Eliquis well with no complications.  She denies any bleeding or bruising.  She has not been checking her sugars because she does not know how to use her meter.  I have my nurse educate the patient on how to use her meter and make sure she was comfortable performing sugar checks.  Today in the office her 2-hour postprandial sugar is excellent at 158.  She is currently taking 16 units of NPH twice a day.  She denies any symptoms of hypoglycemia.  She denies any  polyuria, polydipsia, or blurry vision.  At that time, my plan was: First regarding her treatment of pulmonary emboli.  I will check a CBC to evaluate for any drop in her hemoglobin since starting Eliquis.  Continue Eliquis as prescribed and after 10 days transition to 5 mg twice daily to complete 6 months of therapy.  However I feel that we need to start a hypercoagulable work-up.  Given her age, I do not believe she has an inherited hyper coagulable state such as factor V Leiden.  Therefore I do not feel that she requires this lab work.  I am concerned about possible occult malignancy.  Therefore I will schedule the patient for a CT scan of the abdomen and pelvis.  She is already had a CT angiogram of the chest which was negative for any underlying lung cancer etc.  Mammogram was done in June and was normal.  I will schedule the patient for a Cologuard as she has never had a colonoscopy.  We will do a Cologuard because I do not want to stop her anticoagulant unless absolutely necessary.  Regarding her diabetes, the patient will check her fasting blood sugar and her 2-hour postprandial sugars for the next week.  I will  see the patient back next week and we will review the readings.  If the patient's fasting blood sugars are under 130 and her 2-hour postprandial sugars are under 160 on just 32 units total of insulin, we may try to transition the patient to oral medication gradually and wean her off insulin.  Recheck next week  09/18/20 Patient's fasting blood sugars are averaging in the mid 140s.  Her 2-hour postprandial sugars are around 180.  She is not having any hypoglycemic episodes.  This is on 16 units of insulin twice daily.  She denies any polyuria, polydipsia, or blurry vision.  Her shortness of breath has improved.  She is more comfortable now checking her blood sugars.  However her most recent creatinine had risen to 1.39. Past Medical History:  Diagnosis Date  . Abnormal pap   . Cataract   .  Colon polyps   . Diabetes mellitus without complication ([place])   . Hyperlipidemia   . Hypertension   . Murmur    No past surgical history on file. Current Outpatient Medications on File Prior to Visit  Medication Sig Dispense Refill  . albuterol (VENTOLIN HFA) 108 (90 Base) MCG/ACT inhaler Inhale 2 puffs into the lungs every 4 (four) hours as needed for wheezing or shortness of breath. 6.7 g 0  . amLODipine (NORVASC) 10 MG tablet TAKE 1 TABLET EVERY DAY (Patient taking differently: Take 10 mg by mouth daily. ) 90 tablet 1  . apixaban (ELIQUIS) 5 MG TABS tablet Take 2 tablets (10 mg total) by mouth 2 (two) times daily for 7 days, THEN 1 tablet (5 mg total) 2 (two) times daily for 28 days. 84 tablet 0  . aspirin EC 81 MG tablet Take 81 mg by mouth daily. Swallow whole.    . blood glucose meter kit and supplies KIT Dispense based on patient and insurance preference. Use up to four times daily as directed. (FOR ICD-9 250.00, 250.01). 1 each 0  . Calcium Citrate-Vitamin D (CALCIUM + D PO) Take 1 tablet by mouth daily.    . Cholecalciferol (VITAMIN D3 PO) Take 1 tablet by mouth daily.    . insulin aspart protamine - aspart (NOVOLOG 70/30 FLEXPEN RELION) (70-30) 100 UNIT/ML FlexPen Inject 0.16 mLs (16 Units total) into the skin 2 (two) times daily. 15 mL 11  . Insulin Pen Needle 32G X 4 MM MISC 1 Container by Does not apply route in the morning and at bedtime. 100 each 0  . latanoprost (XALATAN) 0.005 % ophthalmic solution Place 1 drop into both eyes at bedtime.     Marland Kitchen losartan-hydrochlorothiazide (HYZAAR) 50-12.5 MG tablet TAKE 1 TABLET EVERY DAY (Patient taking differently: Take 1 tablet by mouth daily. ) 90 tablet 3  . MITIGARE 0.6 MG CAPS 2 tablets immediately then repeat 1 tablet 0.6 mg in 1 hour if pain persists.  Repeat this process daily until better (Patient taking differently: Take 0.6-1.2 mg by mouth See admin instructions. Take 2 tablets (1.2 mg) immediately at onset of gout flare, then repeat  1 tablet (0.6 mg) in 1 hour if pain persists.  Repeat this process daily until better) 90 capsule 2  . omeprazole (PRILOSEC) 40 MG capsule TAKE 1 CAPSULE EVERY DAY 90 capsule 3  . simvastatin (ZOCOR) 10 MG tablet TAKE 1 TABLET AT BEDTIME (Patient taking differently: Take 10 mg by mouth at bedtime. ) 90 tablet 1   No current facility-administered medications on file prior to visit.   Allergies  Allergen Reactions  .  Penicillins Swelling    Facial swelling   Social History   Socioeconomic History  . Marital status: Single    Spouse name: Not on file  . Number of children: Not on file  . Years of education: Not on file  . Highest education level: Not on file  Occupational History  . Not on file  Tobacco Use  . Smoking status: Never Smoker  . Smokeless tobacco: Former Systems developer    Types: Snuff  Substance and Sexual Activity  . Alcohol use: No  . Drug use: No  . Sexual activity: Not on file  Other Topics Concern  . Not on file  Social History Narrative  . Not on file   Social Determinants of Health   Financial Resource Strain:   . Difficulty of Paying Living Expenses: Not on file  Food Insecurity:   . Worried About Charity fundraiser in the Last Year: Not on file  . Ran Out of Food in the Last Year: Not on file  Transportation Needs: No Transportation Needs  . Lack of Transportation (Medical): No  . Lack of Transportation (Non-Medical): No  Physical Activity:   . Days of Exercise per Week: Not on file  . Minutes of Exercise per Session: Not on file  Stress:   . Feeling of Stress : Not on file  Social Connections:   . Frequency of Communication with Friends and Family: Not on file  . Frequency of Social Gatherings with Friends and Family: Not on file  . Attends Religious Services: Not on file  . Active Member of Clubs or Organizations: Not on file  . Attends Archivist Meetings: Not on file  . Marital Status: Not on file  Intimate Partner Violence:   . Fear of  Current or Ex-Partner: Not on file  . Emotionally Abused: Not on file  . Physically Abused: Not on file  . Sexually Abused: Not on file     Review of Systems  All other systems reviewed and are negative.      Objective:   Physical Exam Vitals reviewed.  Constitutional:      General: She is not in acute distress.    Appearance: Normal appearance. She is normal weight. She is not ill-appearing or toxic-appearing.  Cardiovascular:     Rate and Rhythm: Normal rate and regular rhythm.     Heart sounds: Normal heart sounds. No murmur heard.  No gallop.   Pulmonary:     Effort: Pulmonary effort is normal. No respiratory distress.     Breath sounds: Normal breath sounds. No stridor. No wheezing, rhonchi or rales.  Abdominal:     General: Abdomen is flat. Bowel sounds are normal. There is no distension.     Palpations: Abdomen is soft.     Tenderness: There is no abdominal tenderness. There is no guarding or rebound.  Musculoskeletal:     Right lower leg: No edema.     Left lower leg: No edema.  Neurological:     General: No focal deficit present.     Mental Status: She is alert and oriented to person, place, and time. Mental status is at baseline.     Cranial Nerves: No cranial nerve deficit.     Motor: No weakness.     Coordination: Coordination normal.     Gait: Gait normal.           Assessment & Plan:  Stage 3a chronic kidney disease ([place]) - Plan: CBC with Differential/Platelet, BASIC  METABOLIC PANEL WITH GFR  Pulmonary embolism and infarction [facility])  Uncontrolled type 2 diabetes mellitus with hyperglycemia (HCC)  Reduce insulin to 10 units twice daily and start Metformin 500 mg p.o. twice daily.  In 2 weeks increase to 1000 mg twice daily assuming that she is tolerating the medication without diarrhea.  Monitor blood sugars.  If blood sugars drop below 100 she is to stop insulin immediately and notify me immediately.  Otherwise I would like to see the patient back in  3 weeks to review her blood sugars to see if we can transition her to oral medication and off insulin.  Goal fasting blood sugars will be less than 130.  Goal 2-hour postprandial sugars would be less than 160.  Monitor renal function closely given the addition of Metformin.  Monitor for any evidence of lactic acidosis or metabolic acidosis.  Recommended the patient drink a glass of water with each meal.  She has not been drinking as much lately.  Blood pressure is low so we will discontinue amlodipine and recheck blood pressure in 3 weeks.

## 2020-09-19 LAB — CBC WITH DIFFERENTIAL/PLATELET
Absolute Monocytes: 802 cells/uL (ref 200–950)
Basophils Absolute: 64 cells/uL (ref 0–200)
Basophils Relative: 0.9 %
Eosinophils Absolute: 92 cells/uL (ref 15–500)
Eosinophils Relative: 1.3 %
HCT: 40.4 % (ref 35.0–45.0)
Hemoglobin: 13.6 g/dL (ref 11.7–15.5)
Lymphs Abs: 2265 cells/uL (ref 850–3900)
MCH: 28.5 pg (ref 27.0–33.0)
MCHC: 33.7 g/dL (ref 32.0–36.0)
MCV: 84.7 fL (ref 80.0–100.0)
MPV: 12.1 fL (ref 7.5–12.5)
Monocytes Relative: 11.3 %
Neutro Abs: 3877 cells/uL (ref 1500–7800)
Neutrophils Relative %: 54.6 %
Platelets: 322 10*3/uL (ref 140–400)
RBC: 4.77 10*6/uL (ref 3.80–5.10)
RDW: 13.2 % (ref 11.0–15.0)
Total Lymphocyte: 31.9 %
WBC: 7.1 10*3/uL (ref 3.8–10.8)

## 2020-09-19 LAB — BASIC METABOLIC PANEL WITH GFR
BUN/Creatinine Ratio: 14 (calc) (ref 6–22)
BUN: 20 mg/dL (ref 7–25)
CO2: 23 mmol/L (ref 20–32)
Calcium: 9.9 mg/dL (ref 8.6–10.4)
Chloride: 103 mmol/L (ref 98–110)
Creat: 1.43 mg/dL — ABNORMAL HIGH (ref 0.60–0.93)
GFR, Est African American: 40 mL/min/{1.73_m2} — ABNORMAL LOW (ref >=60)
GFR, Est Non African American: 35 mL/min/{1.73_m2} — ABNORMAL LOW (ref >=60)
Glucose, Bld: 146 mg/dL — ABNORMAL HIGH (ref 65–99)
Potassium: 3.7 mmol/L (ref 3.5–5.3)
Sodium: 137 mmol/L (ref 135–146)

## 2020-09-20 ENCOUNTER — Other Ambulatory Visit: Payer: Self-pay | Admitting: Family Medicine

## 2020-09-25 ENCOUNTER — Other Ambulatory Visit: Payer: Self-pay

## 2020-09-25 NOTE — Patient Outreach (Signed)
[facility]) Care Management  [place]  09/25/2020   Carla Wells [birth year] [id number]  Subjective: Telephone call to patient for follow up.  She reports she is doing good.  Making it to her follow up appointments.  She reports she has another appointment on 10-09-20.  She reports that her sugar this am was 102.  Congratulated patient on her progress. Discussed diet and blood sugar management. Patient received educational information. Discussed information with patient.  She declines any questions or concerns about material received. Advised her to use it as a guide.  She verbalized understanding and voices no concerns.    Objective:   Encounter Medications:  Outpatient Encounter Medications as of 09/25/2020  Medication Sig  . albuterol (VENTOLIN HFA) 108 (90 Base) MCG/ACT inhaler Inhale 2 puffs into the lungs every 4 (four) hours as needed for wheezing or shortness of breath.  Marland Kitchen amLODipine (NORVASC) 10 MG tablet TAKE 1 TABLET EVERY DAY (Patient taking differently: Take 10 mg by mouth daily. )  . apixaban (ELIQUIS) 5 MG TABS tablet Take 2 tablets (10 mg total) by mouth 2 (two) times daily for 7 days, THEN 1 tablet (5 mg total) 2 (two) times daily for 28 days.  Marland Kitchen aspirin EC 81 MG tablet Take 81 mg by mouth daily. Swallow whole.  . blood glucose meter kit and supplies KIT Dispense based on patient and insurance preference. Use up to four times daily as directed. (FOR ICD-9 250.00, 250.01).  . Calcium Citrate-Vitamin D (CALCIUM + D PO) Take 1 tablet by mouth daily.  . Cholecalciferol (VITAMIN D3 PO) Take 1 tablet by mouth daily.  . insulin aspart protamine - aspart (NOVOLOG 70/30 FLEXPEN RELION) (70-30) 100 UNIT/ML FlexPen Inject 0.16 mLs (16 Units total) into the skin 2 (two) times daily.  . Insulin Pen Needle 32G X 4 MM MISC 1 Container by Does not apply route in the morning and at bedtime.  Marland Kitchen latanoprost (XALATAN) 0.005 % ophthalmic solution Place 1 drop into  both eyes at bedtime.   Marland Kitchen losartan-hydrochlorothiazide (HYZAAR) 50-12.5 MG tablet TAKE 1 TABLET EVERY DAY (Patient taking differently: Take 1 tablet by mouth daily. )  . metFORMIN (GLUCOPHAGE) 500 MG tablet Take 2 tablets (1,000 mg total) by mouth 2 (two) times daily with a meal.  . MITIGARE 0.6 MG CAPS 2 tablets immediately then repeat 1 tablet 0.6 mg in 1 hour if pain persists.  Repeat this process daily until better (Patient taking differently: Take 0.6-1.2 mg by mouth See admin instructions. Take 2 tablets (1.2 mg) immediately at onset of gout flare, then repeat 1 tablet (0.6 mg) in 1 hour if pain persists.  Repeat this process daily until better)  . omeprazole (PRILOSEC) 40 MG capsule TAKE 1 CAPSULE EVERY DAY  . simvastatin (ZOCOR) 10 MG tablet TAKE 1 TABLET AT BEDTIME   No facility-administered encounter medications on file as of 09/25/2020.    Functional Status:  In your present state of health, do you have any difficulty performing the following activities: 09/07/2020 09/04/2020  Hearing? N N  Vision? N N  Difficulty concentrating or making decisions? N N  Walking or climbing stairs? N N  Dressing or bathing? N N  Doing errands, shopping? N N  Preparing Food and eating ? N -  Using the Toilet? N -  In the past six months, have you accidently leaked urine? N -  Do you have problems with loss of bowel control? N -  Managing your Medications?  N -  Managing your Finances? N -  Housekeeping or managing your Housekeeping? N -  Some recent data might be hidden    Fall/Depression Screening: Fall Risk  09/18/2020 09/07/2020 01/31/2020  Falls in the past year? 0 0 0  Number falls in past yr: 0 - -  Injury with Fall? 0 - -  Follow up Falls evaluation completed - Falls evaluation completed   PHQ 2/9 Scores 09/18/2020 09/07/2020 01/31/2020 01/19/2019 10/08/2018 09/17/2018 09/17/2018  PHQ - 2 Score 0 0 0 0 0 0 0  PHQ- 9 Score - - - - - - -    Assessment:  Goals Addressed            This  Visit's Progress   . Make and Keep All Appointments   On track    Follow Up Date 10/31/20   - keep a calendar with appointment dates    Why is this important?   Part of staying healthy is seeing the doctor for follow-up care.  If you forget your appointments, there are some things you can do to stay on track.    Notes: Patient has appointment 09/11/20 with PCP    . Monitor and Manage My Blood Sugar   On track    Follow Up Date 10/31/20   - check blood sugar at prescribed times - check blood sugar if I feel it is too high or too low - enter blood sugar readings and medication or insulin into daily log - take the blood sugar log to all doctor visits    Why is this important?   Checking your blood sugar at home helps to keep it from getting very high or very low.  Writing the results in a diary or log helps the doctor know how to care for you.  Your blood sugar log should have the time, date and the results.  Also, write down the amount of insulin or other medicine that you take.  Other information, like what you ate, exercise done and how you were feeling, will also be helpful.     Notes: Patient checking sugars herself most times. 09/25/20 patient checking her sugars with most recent 102.  Keep up the great work!!    . Set My Target A1C   On track    Follow Up Date 12/31/20 - set target A1C    Why is this important?   Your target A1C is decided together by you and your doctor.  It is based on several things like your age and other health issues.    Notes: Goal of 7-8       Plan: RN CM will contact patient again in the month of November and patient agreeable.    Jone Baseman, RN, MSN [place] [facility] Care Management Coordinator Direct Line [phone] Cell [phone] Toll Free: [phone]  Fax: [phone]

## 2020-10-05 ENCOUNTER — Ambulatory Visit
Admission: RE | Admit: 2020-10-05 | Discharge: 2020-10-05 | Disposition: A | Discharge status: Home / Self Care | Payer: Medicare HMO | Source: Ambulatory Visit | Attending: Family Medicine | Admitting: Family Medicine

## 2020-10-05 DIAGNOSIS — K449 Diaphragmatic hernia without obstruction or gangrene: Secondary | ICD-10-CM | POA: Diagnosis not present

## 2020-10-05 DIAGNOSIS — I2699 Other pulmonary embolism without acute cor pulmonale: Secondary | ICD-10-CM

## 2020-10-05 DIAGNOSIS — I700 Atherosclerosis of aorta: Secondary | ICD-10-CM | POA: Diagnosis not present

## 2020-10-05 DIAGNOSIS — D6859 Other primary thrombophilia: Secondary | ICD-10-CM

## 2020-10-05 DIAGNOSIS — I2510 Atherosclerotic heart disease of native coronary artery without angina pectoris: Secondary | ICD-10-CM | POA: Diagnosis not present

## 2020-10-05 DIAGNOSIS — D259 Leiomyoma of uterus, unspecified: Secondary | ICD-10-CM | POA: Diagnosis not present

## 2020-10-05 MED ORDER — IOPAMIDOL (ISOVUE-300) INJECTION 61%
80.0000 mL | Freq: Once | INTRAVENOUS | Status: AC | PRN
  Administered 2020-10-05: 80 mL via INTRAVENOUS

## 2020-10-09 ENCOUNTER — Other Ambulatory Visit: Payer: Self-pay

## 2020-10-09 ENCOUNTER — Ambulatory Visit (INDEPENDENT_AMBULATORY_CARE_PROVIDER_SITE_OTHER): Payer: Medicare HMO | Admitting: Family Medicine

## 2020-10-09 VITALS — BP 124/70 | HR 81 | Temp 97.9°F | Ht 64.0 in | Wt 189.0 lb

## 2020-10-09 DIAGNOSIS — I2699 Other pulmonary embolism without acute cor pulmonale: Secondary | ICD-10-CM

## 2020-10-09 DIAGNOSIS — E1165 Type 2 diabetes mellitus with hyperglycemia: Secondary | ICD-10-CM

## 2020-10-09 DIAGNOSIS — N1831 Chronic kidney disease, stage 3a: Secondary | ICD-10-CM | POA: Diagnosis not present

## 2020-10-09 MED ORDER — APIXABAN 5 MG PO TABS: 5.0000 mg | ORAL_TABLET | Freq: Two times a day (BID) | ORAL | 4 refills | Status: DC

## 2020-10-09 NOTE — Progress Notes (Signed)
Subjective:    Patient ID: Carla Wells, female    DOB: November 04, 1941, 79 y.o.   MRN: [id number]  HPI Unfortunately, the patient was recently admitted with shortness of breath and was found to have multiple pulmonary emboli.  I have copied the discharge summary below: Admit date: 09/03/2020 Discharge date: 09/06/2020  Admitted From: Home Disposition:  Home  Recommendations for Outpatient Follow-up:  1. Follow up with PCP in 1-2 weeks 2. Follow up with Diabetes Education Coordinator within 1-2 weeks 3. Please obtain CMP/CBC, Mag, Phos in one week 4. Please follow up on the following pending results:  Home Health: No Equipment/Devices: None recommended by PT/OT  Discharge Condition: Stable CODE STATUS: FULL CODE Diet recommendation: Heart Healthy Carb Modified Diet   Brief/Interim Summary: The patient is a 79 year old obese African-American female with a past medical history significant for but not limited to hypertension, hyperlipidemia, chronic kidney disease stage III who came to the hospital with shortness of breath.  Shortness of breath have been going on for the past month and was worse last few days and she came to the emergency room.  CT angiogram showed PEs.  She is also found to be in DKA with new onset of diabetes mellitus type 2.  Hemoglobin A1c was 16.  Her blood sugars improved after she was transitioned to insulin and she tolerated heparin drip well and was transitioned to p.o. anticoagulation with apixaban.  We will give her free 30-day supply of apixaban and she was educated by the diabetes education coordinator on insulin administration and she will be discharged on NovoLog 70/30 given the prohibitive expense of the Lantus and Levemir.  Currently she is medically stable to be discharged as she did not desaturate on amatory home O2 screen and blood sugars have been fairly well controlled since that insulin regimen has been adjusted.  She will need to follow-up with PCP  within 1 week.  Discharge Diagnoses:  Principal Problem:   Acute pulmonary embolus (HCC) Active Problems:   Hypertension associated with diabetes ([place])   Diabetic ketoacidosis without coma associated with type 2 diabetes mellitus ([place])   Acute kidney injury superimposed on CKD ([place])   Hyperlipidemia associated with type 2 diabetes mellitus ([place])  Acute pulmonary Embolus -Large clot burden seen on CT scan with concern for right heart strain. No obvious provoking factors will stop PCCM evaluated patient but did not recommend lysis. -2D echo was done, showed EF 18-84%, grade 1 diastolic dysfunction, RV systolic function is normal with normal RV size.  -She has tolerated heparin well, discussed with patient this morning risks benefits of oral anticoagulation and per preference will start Eliquis. Case manager assisted with medication and she will get a free 30 day Supply -No DVT on LE Duplex -C/w Apixaban 10 mg po BID x7 days then 5 mg po BID -Did not Desaturate on Home O2 Screen   DKA, Type 2 diabetes mellitus-patient denies history of diabetes mellitus.She was initially started on insulin infusion, her gap is closed and was placed on Lantus 18 units as well as sliding scale. Fasting this morning is 210, gradually increasing towards lunch. Dr. Cruzita Lederer  added 5 units scheduled NovoLog and increase Lantus. Reviewed care management note, it appears that Lantus and Levemir are quite expensive however NovoLog was relatively well covered, and patient would probably benefit from NPH twice daily on discharge along with a NovoLog sliding scale. Have asked RN to do teaching for CBG check as well as insulin administration, awaiting  diabetes educator. Suspect may be able to be discharged today since CBG's are better -Will discharge on Novolog 70/30 16 units BID at the Diabetes Education Coordinator Recc's -Will need to follow up with Diabetes Education Coordinator within 1-2 weeks    Elevated troponin -no chest pain, this is likely demand ischemia due to large PE.  -Trending down with last Troponin I of 216  Acute kidney injury chronic kidney disease stage IIIa Metabolic Acidosis -Baseline creatinine around 1.1-1.2, up to 1.5 on admission, improved with fluids -BUN/Cr is now 18/1.11; Metabolic Acidosis is mild with a CO2 of 20, anion gap of 9, chloride level 175 rectally monitor and trend and repeat CMP in the outpatient setting  Hypokalemia -Mild at 3.4 -Replete with po KCl 40 mEQ BID x2 -Continue to Monitor and Replete as Necessary -Repeat CMP within 1 week  Essential hypertension -currently normotensive -Resume Home Amlodipine 10 mg po Daily   Hyperlipidemia -continue statin  Obesity -Complicates care and overall prognosis -Estimated body mass index is 35.08 kg/m as calculated from the following:   Height as of this encounter: '5\' 4"'  (1.626 m).   Weight as of this encounter: 92.7 kg. -Continued weight loss and dietary counseling  09/12/20 Patient is here today for follow-up.  My first concern is why did the patient form pulmonary emboli.  She has not had any recent surgery.  She has not had any recent immobilization or prolonged plane flight, etc.  She has no history of coagulopathy.  There are no family histories of DVT or PE.  She has never had a DVT before.  Patient did have Covid in December however that was 10 months ago.  That is her only hypercoagulable risk factor.  She is tolerating Eliquis well with no complications.  She denies any bleeding or bruising.  She has not been checking her sugars because she does not know how to use her meter.  I have my nurse educate the patient on how to use her meter and make sure she was comfortable performing sugar checks.  Today in the office her 2-hour postprandial sugar is excellent at 158.  She is currently taking 16 units of NPH twice a day.  She denies any symptoms of hypoglycemia.  She denies any  polyuria, polydipsia, or blurry vision.  At that time, my plan was: First regarding her treatment of pulmonary emboli.  I will check a CBC to evaluate for any drop in her hemoglobin since starting Eliquis.  Continue Eliquis as prescribed and after 10 days transition to 5 mg twice daily to complete 6 months of therapy.  However I feel that we need to start a hypercoagulable work-up.  Given her age, I do not believe she has an inherited hyper coagulable state such as factor V Leiden.  Therefore I do not feel that she requires this lab work.  I am concerned about possible occult malignancy.  Therefore I will schedule the patient for a CT scan of the abdomen and pelvis.  She is already had a CT angiogram of the chest which was negative for any underlying lung cancer etc.  Mammogram was done in June and was normal.  I will schedule the patient for a Cologuard as she has never had a colonoscopy.  We will do a Cologuard because I do not want to stop her anticoagulant unless absolutely necessary.  Regarding her diabetes, the patient will check her fasting blood sugar and her 2-hour postprandial sugars for the next week.  I will  see the patient back next week and we will review the readings.  If the patient's fasting blood sugars are under 130 and her 2-hour postprandial sugars are under 160 on just 32 units total of insulin, we may try to transition the patient to oral medication gradually and wean her off insulin.  Recheck next week  09/18/20 Patient's fasting blood sugars are averaging in the mid 140s.  Her 2-hour postprandial sugars are around 180.  She is not having any hypoglycemic episodes.  This is on 16 units of insulin twice daily.  She denies any polyuria, polydipsia, or blurry vision.  Her shortness of breath has improved.  She is more comfortable now checking her blood sugars.  However her most recent creatinine had risen to 1.39.  At that time, my plan BDZ:HGDJME insulin to 10 units twice daily and start  Metformin 500 mg p.o. twice daily.  In 2 weeks increase to 1000 mg twice daily assuming that she is tolerating the medication without diarrhea.  Monitor blood sugars.  If blood sugars drop below 100 she is to stop insulin immediately and notify me immediately.  Otherwise I would like to see the patient back in 3 weeks to review her blood sugars to see if we can transition her to oral medication and off insulin.  Goal fasting blood sugars will be less than 130.  Goal 2-hour postprandial sugars would be less than 160.  Monitor renal function closely given the addition of Metformin.  Monitor for any evidence of lactic acidosis or metabolic acidosis.  Recommended the patient drink a glass of water with each meal.  She has not been drinking as much lately.  Blood pressure is low so we will discontinue amlodipine and recheck blood pressure in 3 weeks.  10/09/20 Patient recently had a CAT scan that showed no malignancy in the abdomen.  Therefore I do not see any cancer the puts her in a hypercoagulable state causing her blood clot.  I believe this is most likely due to to her Covid infection.  She will complete her Eliquis in April.  That would complete 6 months of therapy.  She is currently on Metformin 500 mg twice a day and insulin 10 units twice a day.  Her blood sugars in the morning ranged from 83-117.  Her blood sugars 2-hour postprandial range from 104-173.  She has a few hypoglycemic episodes. Past Medical History:  Diagnosis Date   Abnormal pap    Cataract    Colon polyps    Diabetes mellitus without complication ([place])    Hyperlipidemia    Hypertension    Murmur    No past surgical history on file. Current Outpatient Medications on File Prior to Visit  Medication Sig Dispense Refill   albuterol (VENTOLIN HFA) 108 (90 Base) MCG/ACT inhaler Inhale 2 puffs into the lungs every 4 (four) hours as needed for wheezing or shortness of breath. 6.7 g 0   amLODipine (NORVASC) 10 MG tablet TAKE 1  TABLET EVERY DAY (Patient taking differently: Take 10 mg by mouth daily. ) 90 tablet 1   aspirin EC 81 MG tablet Take 81 mg by mouth daily. Swallow whole.     blood glucose meter kit and supplies KIT Dispense based on patient and insurance preference. Use up to four times daily as directed. (FOR ICD-9 250.00, 250.01). 1 each 0   Calcium Citrate-Vitamin D (CALCIUM + D PO) Take 1 tablet by mouth daily.     Cholecalciferol (VITAMIN D3 PO) Take  1 tablet by mouth daily.     insulin aspart protamine - aspart (NOVOLOG 70/30 FLEXPEN RELION) (70-30) 100 UNIT/ML FlexPen Inject 0.16 mLs (16 Units total) into the skin 2 (two) times daily. 15 mL 11   Insulin Pen Needle 32G X 4 MM MISC 1 Container by Does not apply route in the morning and at bedtime. 100 each 0   latanoprost (XALATAN) 0.005 % ophthalmic solution Place 1 drop into both eyes at bedtime.      losartan-hydrochlorothiazide (HYZAAR) 50-12.5 MG tablet TAKE 1 TABLET EVERY DAY (Patient taking differently: Take 1 tablet by mouth daily. ) 90 tablet 3   metFORMIN (GLUCOPHAGE) 500 MG tablet Take 2 tablets (1,000 mg total) by mouth 2 (two) times daily with a meal. 360 tablet 3   MITIGARE 0.6 MG CAPS 2 tablets immediately then repeat 1 tablet 0.6 mg in 1 hour if pain persists.  Repeat this process daily until better (Patient taking differently: Take 0.6-1.2 mg by mouth See admin instructions. Take 2 tablets (1.2 mg) immediately at onset of gout flare, then repeat 1 tablet (0.6 mg) in 1 hour if pain persists.  Repeat this process daily until better) 90 capsule 2   omeprazole (PRILOSEC) 40 MG capsule TAKE 1 CAPSULE EVERY DAY 90 capsule 3   simvastatin (ZOCOR) 10 MG tablet TAKE 1 TABLET AT BEDTIME 90 tablet 1   No current facility-administered medications on file prior to visit.   Allergies  Allergen Reactions   Penicillins Swelling    Facial swelling   Social History   Socioeconomic History   Marital status: Single    Spouse name: Not on  file   Number of children: Not on file   Years of education: Not on file   Highest education level: Not on file  Occupational History   Not on file  Tobacco Use   Smoking status: Never Smoker   Smokeless tobacco: Former Systems developer    Types: Snuff  Substance and Sexual Activity   Alcohol use: No   Drug use: No   Sexual activity: Not on file  Other Topics Concern   Not on file  Social History Narrative   Not on file   Social Determinants of Health   Financial Resource Strain:    Difficulty of Paying Living Expenses: Not on file  Food Insecurity:    Worried About [place] in the Last Year: Not on file   Ran Out of Food in the Last Year: Not on file  Transportation Needs: No Transportation Needs   Lack of Transportation (Medical): No   Lack of Transportation (Non-Medical): No  Physical Activity:    Days of Exercise per Week: Not on file   Minutes of Exercise per Session: Not on file  Stress:    Feeling of Stress : Not on file  Social Connections:    Frequency of Communication with Friends and Family: Not on file   Frequency of Social Gatherings with Friends and Family: Not on file   Attends Religious Services: Not on file   Active Member of Clubs or Organizations: Not on file   Attends Archivist Meetings: Not on file   Marital Status: Not on file  Intimate Partner Violence:    Fear of Current or Ex-Partner: Not on file   Emotionally Abused: Not on file   Physically Abused: Not on file   Sexually Abused: Not on file     Review of Systems  All other systems reviewed and are  negative.      Objective:   Physical Exam Vitals reviewed.  Constitutional:      General: She is not in acute distress.    Appearance: Normal appearance. She is normal weight. She is not ill-appearing or toxic-appearing.  Cardiovascular:     Rate and Rhythm: Normal rate and regular rhythm.     Heart sounds: Normal heart sounds. No murmur heard.   No gallop.   Pulmonary:     Effort: Pulmonary effort is normal. No respiratory distress.     Breath sounds: Normal breath sounds. No stridor. No wheezing, rhonchi or rales.  Abdominal:     General: Abdomen is flat. Bowel sounds are normal. There is no distension.     Palpations: Abdomen is soft.     Tenderness: There is no abdominal tenderness. There is no guarding or rebound.  Musculoskeletal:     Right lower leg: No edema.     Left lower leg: No edema.  Neurological:     General: No focal deficit present.     Mental Status: She is alert and oriented to person, place, and time. Mental status is at baseline.     Cranial Nerves: No cranial nerve deficit.     Motor: No weakness.     Coordination: Coordination normal.     Gait: Gait normal.           Assessment & Plan:  Pulmonary embolism and infarction (HCC)  Stage 3a chronic kidney disease ([place])  Uncontrolled type 2 diabetes mellitus with hyperglycemia ([place])  I do not feel that the patient requires insulin at this time.  I will have her discontinue insulin and start Metformin 1000 mg twice daily.  She will notify me if her blood sugars rise above 250.  If not, I would recheck an A1c in 3 months as long as her blood sugar stay below 200.  Hopefully the Metformin alone will be able to control her sugars.  If not, given her chronic kidney disease, I would add Trulicity once a week.  Complete 6 months of Eliquis.  She will complete this in early April.  Recheck lab work in March.

## 2020-10-10 ENCOUNTER — Other Ambulatory Visit: Payer: Self-pay

## 2020-10-10 NOTE — Patient Outreach (Signed)
[facility]) Care Management  [place]  10/10/2020   BAYLOR TEEGARDEN Jan 25, 1941 [id number]  Subjective: Telephone call to patient for follow up. Patient reports off insulin now.  Taking metformin BID.  Expressed how proud CM is of her and her progress. Patient sugar this am 95.  Discussed with patient the MD goal of sugars less than 250.  Reviewed diet and continue progress. She verbalized understanding and voices no concerns.     Objective:   Encounter Medications:  Outpatient Encounter Medications as of 10/10/2020  Medication Sig  . albuterol (VENTOLIN HFA) 108 (90 Base) MCG/ACT inhaler Inhale 2 puffs into the lungs every 4 (four) hours as needed for wheezing or shortness of breath.  Marland Kitchen amLODipine (NORVASC) 10 MG tablet TAKE 1 TABLET EVERY DAY (Patient taking differently: Take 10 mg by mouth daily. )  . apixaban (ELIQUIS) 5 MG TABS tablet Take 1 tablet (5 mg total) by mouth 2 (two) times daily.  Marland Kitchen aspirin EC 81 MG tablet Take 81 mg by mouth daily. Swallow whole.  . blood glucose meter kit and supplies KIT Dispense based on patient and insurance preference. Use up to four times daily as directed. (FOR ICD-9 250.00, 250.01).  . Calcium Citrate-Vitamin D (CALCIUM + D PO) Take 1 tablet by mouth daily.  . Cholecalciferol (VITAMIN D3 PO) Take 1 tablet by mouth daily.  . insulin aspart protamine - aspart (NOVOLOG 70/30 FLEXPEN RELION) (70-30) 100 UNIT/ML FlexPen Inject 0.16 mLs (16 Units total) into the skin 2 (two) times daily.  . Insulin Pen Needle 32G X 4 MM MISC 1 Container by Does not apply route in the morning and at bedtime.  Marland Kitchen latanoprost (XALATAN) 0.005 % ophthalmic solution Place 1 drop into both eyes at bedtime.   Marland Kitchen losartan-hydrochlorothiazide (HYZAAR) 50-12.5 MG tablet TAKE 1 TABLET EVERY DAY (Patient taking differently: Take 1 tablet by mouth daily. )  . metFORMIN (GLUCOPHAGE) 500 MG tablet Take 2 tablets (1,000 mg total) by mouth 2 (two) times daily with a  meal.  . MITIGARE 0.6 MG CAPS 2 tablets immediately then repeat 1 tablet 0.6 mg in 1 hour if pain persists.  Repeat this process daily until better (Patient taking differently: Take 0.6-1.2 mg by mouth See admin instructions. Take 2 tablets (1.2 mg) immediately at onset of gout flare, then repeat 1 tablet (0.6 mg) in 1 hour if pain persists.  Repeat this process daily until better)  . omeprazole (PRILOSEC) 40 MG capsule TAKE 1 CAPSULE EVERY DAY  . simvastatin (ZOCOR) 10 MG tablet TAKE 1 TABLET AT BEDTIME   No facility-administered encounter medications on file as of 10/10/2020.    Functional Status:  In your present state of health, do you have any difficulty performing the following activities: 09/07/2020 09/04/2020  Hearing? N N  Vision? N N  Difficulty concentrating or making decisions? N N  Walking or climbing stairs? N N  Dressing or bathing? N N  Doing errands, shopping? N N  Preparing Food and eating ? N -  Using the Toilet? N -  In the past six months, have you accidently leaked urine? N -  Do you have problems with loss of bowel control? N -  Managing your Medications? N -  Managing your Finances? N -  Housekeeping or managing your Housekeeping? N -  Some recent data might be hidden    Fall/Depression Screening: Fall Risk  09/18/2020 09/07/2020 01/31/2020  Falls in the past year? 0 0 0  Number  falls in past yr: 0 - -  Injury with Fall? 0 - -  Follow up Falls evaluation completed - Falls evaluation completed   PHQ 2/9 Scores 09/18/2020 09/07/2020 01/31/2020 01/19/2019 10/08/2018 09/17/2018 09/17/2018  PHQ - 2 Score 0 0 0 0 0 0 0  PHQ- 9 Score - - - - - - -    Assessment: Patient continues to manage her diabetes and making lots of progress.  Patient benefits from CM follow up.   Goals Addressed            This Visit's Progress   . Make and Keep All Appointments   On track    Follow Up Date 11/30/20   - keep a calendar with appointment dates    Why is this important?    Part of staying healthy is seeing the doctor for follow-up care.  If you forget your appointments, there are some things you can do to stay on track.    Notes:     Marland Kitchen Monitor and Manage My Blood Sugar   On track    Follow Up Date 11/30/20   - check blood sugar at prescribed times - check blood sugar if I feel it is too high or too low - enter blood sugar readings and medication or insulin into daily log - take the blood sugar log to all doctor visits    Why is this important?   Checking your blood sugar at home helps to keep it from getting very high or very low.  Writing the results in a diary or log helps the doctor know how to care for you.  Your blood sugar log should have the time, date and the results.  Also, write down the amount of insulin or other medicine that you take.  Other information, like what you ate, exercise done and how you were feeling, will also be helpful.     Notes: Patient checking sugars herself most times. 09/25/20 patient checking her sugars with most recent 102.  Keep up the great work!! 10/10/20 Off insulin morning sugar 95. Goal of less than 250.    Marland Kitchen Set My Target A1C   On track    Follow Up Date 12/31/20 - set target A1C    Why is this important?   Your target A1C is decided together by you and your doctor.  It is based on several things like your age and other health issues.    Notes: Goal of 7-8       Plan: RN CM will contact patient next month and patient agreeable.   Jone Baseman, RN, MSN [place] Management Care Management Coordinator Direct Line [phone] Cell [phone] Toll Free: [phone]  Fax: [phone]

## 2020-10-13 ENCOUNTER — Ambulatory Visit: Payer: Self-pay

## 2020-10-19 ENCOUNTER — Ambulatory Visit (INDEPENDENT_AMBULATORY_CARE_PROVIDER_SITE_OTHER): Payer: Medicare HMO | Admitting: Family Medicine

## 2020-10-19 ENCOUNTER — Other Ambulatory Visit: Payer: Self-pay

## 2020-10-19 VITALS — BP 120/40 | HR 97 | Temp 98.1°F | Ht 64.0 in | Wt 187.0 lb

## 2020-10-19 DIAGNOSIS — H9313 Tinnitus, bilateral: Secondary | ICD-10-CM

## 2020-10-19 DIAGNOSIS — H9113 Presbycusis, bilateral: Secondary | ICD-10-CM | POA: Diagnosis not present

## 2020-10-19 NOTE — Progress Notes (Signed)
Subjective:    Patient ID: Carla Wells, female    DOB: January 13, 1941, 79 y.o.   MRN: [id number]  HPI  I saw the patient in May for tinnitus in her right ear.  She now presents with tinnitus in her left ear.  She describes it as a roaring sound.  It is worse at night when she is trying to sleep and during the daytime when things are quiet.  It bothers her and it is distracting.  She denies any vertigo.  She denies any otalgia.  She denies any dizziness.  She denies any head trauma or headaches. Past Medical History:  Diagnosis Date  . Abnormal pap   . Cataract   . Colon polyps   . Diabetes mellitus without complication ([place])   . Hyperlipidemia   . Hypertension   . Murmur    No past surgical history on file. Current Outpatient Medications on File Prior to Visit  Medication Sig Dispense Refill  . albuterol (VENTOLIN HFA) 108 (90 Base) MCG/ACT inhaler Inhale 2 puffs into the lungs every 4 (four) hours as needed for wheezing or shortness of breath. 6.7 g 0  . amLODipine (NORVASC) 10 MG tablet TAKE 1 TABLET EVERY DAY (Patient taking differently: Take 10 mg by mouth daily. ) 90 tablet 1  . apixaban (ELIQUIS) 5 MG TABS tablet Take 1 tablet (5 mg total) by mouth 2 (two) times daily. 60 tablet 4  . aspirin EC 81 MG tablet Take 81 mg by mouth daily. Swallow whole.    . blood glucose meter kit and supplies KIT Dispense based on patient and insurance preference. Use up to four times daily as directed. (FOR ICD-9 250.00, 250.01). 1 each 0  . Calcium Citrate-Vitamin D (CALCIUM + D PO) Take 1 tablet by mouth daily.    . Cholecalciferol (VITAMIN D3 PO) Take 1 tablet by mouth daily.    . insulin aspart protamine - aspart (NOVOLOG 70/30 FLEXPEN RELION) (70-30) 100 UNIT/ML FlexPen Inject 0.16 mLs (16 Units total) into the skin 2 (two) times daily. 15 mL 11  . Insulin Pen Needle 32G X 4 MM MISC 1 Container by Does not apply route in the morning and at bedtime. 100 each 0  . latanoprost (XALATAN) 0.005 %  ophthalmic solution Place 1 drop into both eyes at bedtime.     Marland Kitchen losartan-hydrochlorothiazide (HYZAAR) 50-12.5 MG tablet TAKE 1 TABLET EVERY DAY (Patient taking differently: Take 1 tablet by mouth daily. ) 90 tablet 3  . metFORMIN (GLUCOPHAGE) 500 MG tablet Take 2 tablets (1,000 mg total) by mouth 2 (two) times daily with a meal. 360 tablet 3  . MITIGARE 0.6 MG CAPS 2 tablets immediately then repeat 1 tablet 0.6 mg in 1 hour if pain persists.  Repeat this process daily until better (Patient taking differently: Take 0.6-1.2 mg by mouth See admin instructions. Take 2 tablets (1.2 mg) immediately at onset of gout flare, then repeat 1 tablet (0.6 mg) in 1 hour if pain persists.  Repeat this process daily until better) 90 capsule 2  . omeprazole (PRILOSEC) 40 MG capsule TAKE 1 CAPSULE EVERY DAY 90 capsule 3  . simvastatin (ZOCOR) 10 MG tablet TAKE 1 TABLET AT BEDTIME 90 tablet 1   No current facility-administered medications on file prior to visit.   Allergies  Allergen Reactions  . Penicillins Swelling    Facial swelling   Social History   Socioeconomic History  . Marital status: Single    Spouse name: Not on  file  . Number of children: Not on file  . Years of education: Not on file  . Highest education level: Not on file  Occupational History  . Not on file  Tobacco Use  . Smoking status: Never Smoker  . Smokeless tobacco: Former Systems developer    Types: Snuff  Substance and Sexual Activity  . Alcohol use: No  . Drug use: No  . Sexual activity: Not on file  Other Topics Concern  . Not on file  Social History Narrative  . Not on file   Social Determinants of Health   Financial Resource Strain:   . Difficulty of Paying Living Expenses: Not on file  Food Insecurity:   . Worried About Charity fundraiser in the Last Year: Not on file  . Ran Out of Food in the Last Year: Not on file  Transportation Needs: No Transportation Needs  . Lack of Transportation (Medical): No  . Lack of  Transportation (Non-Medical): No  Physical Activity:   . Days of Exercise per Week: Not on file  . Minutes of Exercise per Session: Not on file  Stress:   . Feeling of Stress : Not on file  Social Connections:   . Frequency of Communication with Friends and Family: Not on file  . Frequency of Social Gatherings with Friends and Family: Not on file  . Attends Religious Services: Not on file  . Active Member of Clubs or Organizations: Not on file  . Attends Archivist Meetings: Not on file  . Marital Status: Not on file  Intimate Partner Violence:   . Fear of Current or Ex-Partner: Not on file  . Emotionally Abused: Not on file  . Physically Abused: Not on file  . Sexually Abused: Not on file     Review of Systems  All other systems reviewed and are negative.      Objective:   Physical Exam Vitals reviewed.  Constitutional:      Appearance: Normal appearance.  HENT:     Right Ear: Tympanic membrane and ear canal normal. No swelling or tenderness. No middle ear effusion. There is no impacted cerumen. Tympanic membrane is not injected, scarred, perforated or erythematous.     Left Ear: Tympanic membrane and ear canal normal. No swelling or tenderness.  No middle ear effusion. There is no impacted cerumen. Tympanic membrane is not injected, scarred or perforated.     Nose: Nose normal. No congestion or rhinorrhea.  Cardiovascular:     Rate and Rhythm: Normal rate and regular rhythm.     Heart sounds: Normal heart sounds.  Pulmonary:     Effort: Pulmonary effort is normal.     Breath sounds: Normal breath sounds.  Neurological:     Mental Status: She is alert.          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  Patient does not want to get hearing aids yet.  Therefore I reassured the patient  that I see nothing wrong on her exam.  If it worsens, I will be glad to send her to see an audiologist to be evaluated for hearing aids however I see no reversible cause on her exam.

## 2020-11-01 ENCOUNTER — Other Ambulatory Visit: Payer: Self-pay | Admitting: Family Medicine

## 2020-11-08 ENCOUNTER — Other Ambulatory Visit: Payer: Self-pay

## 2020-11-08 NOTE — Patient Instructions (Addendum)
Goals Addressed            This Visit's Progress   . Set My Target A1C   On track    Timeframe:  Short-Term Goal Priority:  Medium Start Date:  11/08/20                           Expected End Date:  03/01/21                     - set target A1C    Why is this important?   Your target A1C is decided together by you and your doctor.  It is based on several things like your age and other health issues.    Notes: Goal of 7-8    . THN-Make and Keep All Appointments   On track    Timeframe:  Long-Range Goal Priority:  High Start Date:    11/08/20                         Expected End Date:   03/01/21                   - keep a calendar with appointment dates    Why is this important?   Part of staying healthy is seeing the doctor for follow-up care.  If you forget your appointments, there are some things you can do to stay on track.    Notes:     . THN-Monitor and Manage My Blood Sugar   On track    Timeframe:  Long-Range Goal Priority:  High Start Date:  11/08/20                           Expected End Date:  03/01/21                       - check blood sugar at prescribed times - check blood sugar if I feel it is too high or too low - enter blood sugar readings and medication or insulin into daily log - take the blood sugar log to all doctor visits    Why is this important?   Checking your blood sugar at home helps to keep it from getting very high or very low.  Writing the results in a diary or log helps the doctor know how to care for you.  Your blood sugar log should have the time, date and the results.  Also, write down the amount of insulin or other medicine that you take.  Other information, like what you ate, exercise done and how you were feeling, will also be helpful.     Notes: Blood sugars under better control.  Keep up the great work!!      Diabetes Mellitus and Sick Day Management Blood sugar (glucose) can be difficult to control when you are sick. Common  illnesses that can cause problems for people with diabetes (diabetes mellitus) include colds, fever, flu (influenza), nausea, vomiting, and diarrhea. These illnesses can cause stress and loss of body fluids (dehydration), and those issues can cause blood glucose levels to increase. Because of this, it is very important to take your insulin and diabetes medicines and eat some form of carbohydrate when you are sick. You should make a plan for days when you are sick (sick day plan) as part of your  diabetes management plan. You and your health care provider should make this plan in advance. The following guidelines are intended to help you manage an illness that lasts for about 24 hours or less. Your health care provider may also give you more specific instructions. What do I need to do to manage my blood glucose?   Check your blood glucose every 2-4 hours, or as often as told by your health care provider.  Know your sick day treatment goals. Your target blood glucose levels may be different when you are sick.  If you use insulin, take your usual dose. ? If your blood glucose continues to be too high, you may need to take an additional insulin dose as told by your health care provider.  If you use oral diabetes medicine, you may need to stop taking it if you are not able to eat or drink normally. Ask your health care provider about whether you need to stop taking these medicines while you are sick.  If you use injectable hormone medicines other than insulin to control your diabetes, ask your health care provider about whether you need to stop taking these medicines while you are sick. What else can I do to manage my diabetes when I am sick? Check your ketones  If you have type 1 diabetes, check your urine ketones every 4 hours.  If you have type 2 diabetes, check your urine ketones as often as told by your health care provider. Drink fluids  Drink enough fluid to keep your urine clear or pale  yellow. This is especially important if you have a fever, vomiting, or diarrhea. Those symptoms can lead to dehydration.  Follow any instructions from your health care provider about beverages to avoid. ? Do not drink alcohol, caffeine, or drinks that contain a lot of sugar. Take medicines as directed  Take-over-the-counter and prescription medicines only as told by your health care provider.  Check medicine labels for added sugars. Some medicines may contain sugar or types of sugars that can raise your blood glucose level. What foods can I eat when I am sick?  You need to eat some form of carbohydrates when you are sick. You should eat 45-50 grams (45-50 g) of carbohydrates every 3-4 hours until you feel better. All of the food choices below contain about 15 g of carbohydrates. Plan ahead and keep some of these foods around so you have them if you get sick.  4-6 oz (120-177 mL) carbonated beverage that contains sugar, such as regular (not diet) soda. You may be able to drink carbonated beverages more easily if you open the beverage and let it sit at room temperature for a few minutes before drinking.   of a twin frozen ice pop.  4 oz (120 g) regular gelatin.  4 oz (120 mL) fruit juice.  4 oz (120 g) ice cream or frozen yogurt.  2 oz (60 g) sherbet.  8 oz (240 mL) clear broth or soup.  4 oz (120 g) regular custard.  4 oz (120 g) regular pudding.  8 oz (240 g) plain yogurt.  1 slice bread or toast.  6 saltine crackers.  5 vanilla wafers. Questions to ask your health care provider Consider asking the following questions so you know what to do on days when you are sick:  Should I adjust my diabetes medicines?  How often do I need to check my blood glucose?  What supplies do I need to manage my diabetes at home  when I am sick?  What number can I call if I have questions?  What foods and drinks should I avoid? Contact a health care provider if:  You develop symptoms  of diabetic ketoacidosis, such as: ? Fatigue. ? Weight loss. ? Excessive thirst. ? Light-headedness. ? Fruity or sweet-smelling breath. ? Excessive urination. ? Vision changes. ? Confusion or irritability. ? Nausea. ? Vomiting. ? Rapid breathing. ? Pain in the abdomen. ? Feeling flushed.  You are unable to drink fluids without vomiting.  You have any of the following for more than 6 hours: ? Nausea. ? Vomiting. ? Diarrhea.  Your blood glucose is at or above 240 mg/dL (13.3 mmol/L), even after you take an additional insulin dose.  You have a change in how you think, feel, or act (mental status).  You develop another serious illness.  You have been sick or have had a fever for 2 days or longer and you are not getting better. Get help right away if:  Your blood glucose is lower than 54 mg/dL (3.0 mmol/L).  You have difficulty breathing.  You have moderate or high ketone levels in your urine.  You used emergency glucagon to treat low blood glucose. Summary  Blood sugar (glucose) can be difficult to control when you are sick. Common illnesses that can cause problems for people with diabetes (diabetes mellitus) include colds, fever, flu (influenza), nausea, vomiting, and diarrhea.  Illnesses can cause stress and loss of body fluids (dehydration), and those issues can cause blood glucose levels to increase.  Make a plan for days when you are sick (sick day plan) as part of your diabetes management plan. You and your health care provider should make this plan in advance.  It is very important to take your insulin and diabetes medicines and to eat some form of carbohydrate when you are sick.  Contact your health care provider if have problems managing your blood glucose levels when you are sick, or if you have been sick or had a fever for 2 days or longer and are not getting better. This information is not intended to replace advice given to you by your health care provider.  Make sure you discuss any questions you have with your health care provider. Document Revised: 08/16/2016 Document Reviewed: 08/16/2016 Elsevier Patient Education  [place].

## 2020-11-23 ENCOUNTER — Telehealth: Payer: Self-pay

## 2020-11-23 NOTE — Telephone Encounter (Signed)
First make sur she is drinking enough water and not dehydrated.  If ok, there are no prescription medicines for dry mouth but she could try biotene OTC

## 2020-11-23 NOTE — Telephone Encounter (Signed)
Ms. Tawny Asal has dry mouth, able to swallow and eat, mouth is just staying dry, she asked if something could be sent to pharmacy

## 2020-11-27 NOTE — Telephone Encounter (Signed)
Patient made of aware of Providers instructions, patient said she is drinking water but not enough

## 2020-11-29 ENCOUNTER — Telehealth: Payer: Self-pay | Admitting: Family Medicine

## 2020-11-29 NOTE — Telephone Encounter (Signed)
Patient would like to know what to take for a migraine. She isn't sure what she can take  CB# [phone]

## 2020-11-30 NOTE — Telephone Encounter (Signed)
Dr. Tanya Nones has informed pt that it is fine to take tylenol for the migraines

## 2020-12-19 ENCOUNTER — Other Ambulatory Visit: Payer: Self-pay

## 2020-12-19 NOTE — Patient Outreach (Signed)
[place] [facility]) Care Management  [place]  12/19/2020   Carla Wells Aug 07, 1941 [id number]  Subjective: Telephone call to patient for disease management follow up. Patient reports she is doing good.  Blood sugar this am was 107.  Patient reports her sugars have been good and that she is trying to stay off insulin. Discussed diabetes control and diet.  She verbalized understanding.    Objective:   Encounter Medications:  Outpatient Encounter Medications as of 12/19/2020  Medication Sig  . albuterol (VENTOLIN HFA) 108 (90 Base) MCG/ACT inhaler Inhale 2 puffs into the lungs every 4 (four) hours as needed for wheezing or shortness of breath.  Marland Kitchen amLODipine (NORVASC) 10 MG tablet TAKE 1 TABLET EVERY DAY  . apixaban (ELIQUIS) 5 MG TABS tablet Take 1 tablet (5 mg total) by mouth 2 (two) times daily.  Marland Kitchen aspirin EC 81 MG tablet Take 81 mg by mouth daily. Swallow whole.  . blood glucose meter kit and supplies KIT Dispense based on patient and insurance preference. Use up to four times daily as directed. (FOR ICD-9 250.00, 250.01).  . Calcium Citrate-Vitamin D (CALCIUM + D PO) Take 1 tablet by mouth daily.  . Cholecalciferol (VITAMIN D3 PO) Take 1 tablet by mouth daily.  . insulin aspart protamine - aspart (NOVOLOG 70/30 FLEXPEN RELION) (70-30) 100 UNIT/ML FlexPen Inject 0.16 mLs (16 Units total) into the skin 2 (two) times daily.  . Insulin Pen Needle 32G X 4 MM MISC 1 Container by Does not apply route in the morning and at bedtime.  Marland Kitchen latanoprost (XALATAN) 0.005 % ophthalmic solution Place 1 drop into both eyes at bedtime.   Marland Kitchen losartan-hydrochlorothiazide (HYZAAR) 50-12.5 MG tablet TAKE 1 TABLET EVERY DAY  . metFORMIN (GLUCOPHAGE) 500 MG tablet Take 2 tablets (1,000 mg total) by mouth 2 (two) times daily with a meal.  . MITIGARE 0.6 MG CAPS 2 tablets immediately then repeat 1 tablet 0.6 mg in 1 hour if pain persists.  Repeat this process daily until better (Patient taking  differently: Take 0.6-1.2 mg by mouth See admin instructions. Take 2 tablets (1.2 mg) immediately at onset of gout flare, then repeat 1 tablet (0.6 mg) in 1 hour if pain persists.  Repeat this process daily until better)  . omeprazole (PRILOSEC) 40 MG capsule TAKE 1 CAPSULE EVERY DAY  . simvastatin (ZOCOR) 10 MG tablet TAKE 1 TABLET AT BEDTIME   No facility-administered encounter medications on file as of 12/19/2020.    Functional Status:  In your present state of health, do you have any difficulty performing the following activities: 12/19/2020 11/08/2020  Hearing? N N  Vision? N N  Difficulty concentrating or making decisions? N N  Walking or climbing stairs? N N  Dressing or bathing? N N  Doing errands, shopping? N N  Preparing Food and eating ? N N  Using the Toilet? N N  In the past six months, have you accidently leaked urine? N N  Do you have problems with loss of bowel control? N N  Managing your Medications? N N  Managing your Finances? N N  Housekeeping or managing your Housekeeping? N N  Some recent data might be hidden    Fall/Depression Screening: Fall Risk  11/08/2020 10/19/2020 09/18/2020  Falls in the past year? 0 0 0  Number falls in past yr: 0 0 0  Injury with Fall? 0 0 0  Follow up - Falls evaluation completed Falls evaluation completed   PHQ 2/9 Scores 12/19/2020  11/08/2020 10/19/2020 09/18/2020 09/07/2020 01/31/2020 01/19/2019  PHQ - 2 Score 0 0 0 0 0 0 0  PHQ- 9 Score - - - - - - -    Assessment: Patient managing diabetes well.  Patient continues to need diet education.  Goals Addressed            This Visit's Progress   . RN CM THN-Make and Keep All Appointments       Timeframe:  Long-Range Goal Priority:  High Start Date:    11/08/20                         Expected End Date:   03/31/21  Follow up date 03/31/21                - keep a calendar with appointment dates    Why is this important?   Part of staying healthy is seeing the doctor for  follow-up care.  If you forget your appointments, there are some things you can do to stay on track.    Notes:     . COMPLETED: Set My Target A1C       Timeframe:  Short-Term Goal Priority:  Medium Start Date:  11/08/20                           Expected End Date:  03/01/21                     - set target A1C    Why is this important?   Your target A1C is decided together by you and your doctor.  It is based on several things like your age and other health issues.    Notes: Goal of 7-8    . THN-Monitor and Manage My Blood Sugar       Timeframe:  Long-Range Goal Priority:  High Start Date:  11/08/20                           Expected End Date:  03/31/21                  - check blood sugar at prescribed times - check blood sugar if I feel it is too high or too low - enter blood sugar readings and medication or insulin into daily log - take the blood sugar log to all doctor visits    Why is this important?   Checking your blood sugar at home helps to keep it from getting very high or very low.  Writing the results in a diary or log helps the doctor know how to care for you.  Your blood sugar log should have the time, date and the results.  Also, write down the amount of insulin or other medicine that you take.  Other information, like what you ate, exercise done and how you were feeling, will also be helpful.     Notes: Keep up the great work!!       Plan: RN CM will contact in March. Follow-up:  Patient agrees to Care Plan and Follow-up.   Jone Baseman, RN, MSN [facility] Care Management Coordinator Direct Line [phone] Cell [phone] Toll Free: [phone]  Fax: [phone]

## 2020-12-19 NOTE — Patient Instructions (Signed)
Goals Addressed            This Visit's Progress   . RN CM THN-Make and Keep All Appointments       Timeframe:  Long-Range Goal Priority:  High Start Date:    11/08/20                         Expected End Date:   03/31/21  Follow up date 03/31/21                - keep a calendar with appointment dates    Why is this important?   Part of staying healthy is seeing the doctor for follow-up care.  If you forget your appointments, there are some things you can do to stay on track.    Notes:     . COMPLETED: Set My Target A1C       Timeframe:  Short-Term Goal Priority:  Medium Start Date:  11/08/20                           Expected End Date:  03/01/21                     - set target A1C    Why is this important?   Your target A1C is decided together by you and your doctor.  It is based on several things like your age and other health issues.    Notes: Goal of 7-8    . THN-Monitor and Manage My Blood Sugar       Timeframe:  Long-Range Goal Priority:  High Start Date:  11/08/20                           Expected End Date:  03/31/21                  - check blood sugar at prescribed times - check blood sugar if I feel it is too high or too low - enter blood sugar readings and medication or insulin into daily log - take the blood sugar log to all doctor visits    Why is this important?   Checking your blood sugar at home helps to keep it from getting very high or very low.  Writing the results in a diary or log helps the doctor know how to care for you.  Your blood sugar log should have the time, date and the results.  Also, write down the amount of insulin or other medicine that you take.  Other information, like what you ate, exercise done and how you were feeling, will also be helpful.     Notes: Keep up the great work!!

## 2021-01-05 ENCOUNTER — Encounter: Payer: Self-pay | Admitting: Family Medicine

## 2021-01-05 ENCOUNTER — Other Ambulatory Visit: Payer: Self-pay

## 2021-01-05 ENCOUNTER — Ambulatory Visit (INDEPENDENT_AMBULATORY_CARE_PROVIDER_SITE_OTHER): Payer: Medicare HMO | Admitting: Family Medicine

## 2021-01-05 VITALS — BP 120/68 | HR 110 | Temp 98.6°F | Ht 64.0 in | Wt 175.0 lb

## 2021-01-05 DIAGNOSIS — M109 Gout, unspecified: Secondary | ICD-10-CM | POA: Diagnosis not present

## 2021-01-05 MED ORDER — METHYLPREDNISOLONE ACETATE 40 MG/ML IJ SUSP
60.0000 mg | Freq: Once | INTRAMUSCULAR | Status: AC
  Administered 2021-01-05: 60 mg via INTRAMUSCULAR

## 2021-01-05 MED ORDER — PREDNISONE 20 MG PO TABS: ORAL_TABLET | ORAL | 0 refills | Status: DC

## 2021-01-05 NOTE — Progress Notes (Signed)
Subjective:    Patient ID: Carla Wells, female    DOB: 1941/04/24, 80 y.o.   MRN: [id number]  HPI  Patient reports the sudden onset of pain in her right foot beginning yesterday.  The midfoot is swollen warm to the touch and extremely painful.  This is consistent with the same presentation she has with gout.  She ran out of her gout pills and therefore she has not been able to take anything for it yet.  She denies any falls or injuries.  The tenderness is pain is primarily over the dorsal fourth and fifth metatarsals.  There is swelling in that area but there is no evidence of cellulitis or infection Past Medical History:  Diagnosis Date  . Abnormal pap   . Cataract   . Colon polyps   . Diabetes mellitus without complication ([place])   . Hyperlipidemia   . Hypertension   . Murmur    No past surgical history on file. Current Outpatient Medications on File Prior to Visit  Medication Sig Dispense Refill  . albuterol (VENTOLIN HFA) 108 (90 Base) MCG/ACT inhaler Inhale 2 puffs into the lungs every 4 (four) hours as needed for wheezing or shortness of breath. 6.7 g 0  . amLODipine (NORVASC) 10 MG tablet TAKE 1 TABLET EVERY DAY 90 tablet 1  . apixaban (ELIQUIS) 5 MG TABS tablet Take 1 tablet (5 mg total) by mouth 2 (two) times daily. 60 tablet 4  . blood glucose meter kit and supplies KIT Dispense based on patient and insurance preference. Use up to four times daily as directed. (FOR ICD-9 250.00, 250.01). 1 each 0  . Calcium Citrate-Vitamin D (CALCIUM + D PO) Take 1 tablet by mouth daily.    . Cholecalciferol (VITAMIN D3 PO) Take 1 tablet by mouth daily.    . Insulin Pen Needle 32G X 4 MM MISC 1 Container by Does not apply route in the morning and at bedtime. 100 each 0  . latanoprost (XALATAN) 0.005 % ophthalmic solution Place 1 drop into both eyes at bedtime.     Marland Kitchen losartan-hydrochlorothiazide (HYZAAR) 50-12.5 MG tablet TAKE 1 TABLET EVERY DAY 90 tablet 3  . metFORMIN (GLUCOPHAGE) 500 MG  tablet Take 2 tablets (1,000 mg total) by mouth 2 (two) times daily with a meal. 360 tablet 3  . omeprazole (PRILOSEC) 40 MG capsule TAKE 1 CAPSULE EVERY DAY 90 capsule 3  . simvastatin (ZOCOR) 10 MG tablet TAKE 1 TABLET AT BEDTIME 90 tablet 1  . aspirin EC 81 MG tablet Take 81 mg by mouth daily. Swallow whole. (Patient not taking: Reported on 01/05/2021)    . insulin aspart protamine - aspart (NOVOLOG 70/30 FLEXPEN RELION) (70-30) 100 UNIT/ML FlexPen Inject 0.16 mLs (16 Units total) into the skin 2 (two) times daily. (Patient not taking: Reported on 01/05/2021) 15 mL 11  . MITIGARE 0.6 MG CAPS 2 tablets immediately then repeat 1 tablet 0.6 mg in 1 hour if pain persists.  Repeat this process daily until better (Patient not taking: Reported on 01/05/2021) 90 capsule 2   No current facility-administered medications on file prior to visit.   Allergies  Allergen Reactions  . Penicillins Swelling    Facial swelling   Social History   Socioeconomic History  . Marital status: Single    Spouse name: Not on file  . Number of children: Not on file  . Years of education: Not on file  . Highest education level: Not on file  Occupational History  .  Not on file  Tobacco Use  . Smoking status: Never Smoker  . Smokeless tobacco: Former Systems developer    Types: Snuff  Substance and Sexual Activity  . Alcohol use: No  . Drug use: No  . Sexual activity: Not on file  Other Topics Concern  . Not on file  Social History Narrative  . Not on file   Social Determinants of Health   Financial Resource Strain: Not on file  Food Insecurity: No Food Insecurity  . Worried About Charity fundraiser in the Last Year: Never true  . Ran Out of Food in the Last Year: Never true  Transportation Needs: No Transportation Needs  . Lack of Transportation (Medical): No  . Lack of Transportation (Non-Medical): No  Physical Activity: Not on file  Stress: Not on file  Social Connections: Not on file  Intimate Partner Violence:  Not on file     Review of Systems  All other systems reviewed and are negative.      Objective:   Physical Exam Vitals reviewed.  Constitutional:      Appearance: Normal appearance.  Cardiovascular:     Rate and Rhythm: Normal rate and regular rhythm.     Heart sounds: Normal heart sounds.  Pulmonary:     Effort: Pulmonary effort is normal.     Breath sounds: Normal breath sounds.  Musculoskeletal:     Right foot: Decreased range of motion. Swelling, tenderness and bony tenderness present.       Feet:  Neurological:     Mental Status: She is alert.           Assessment & Plan:  Acute gout of right foot, unspecified cause  Patient received Depo-Medrol 60 mg IM x1 now and then will start a prednisone taper pack tomorrow.  Recheck next week if no better or sooner if worse.

## 2021-01-05 NOTE — Addendum Note (Signed)
Addended by: Kittie Plater, Taleah Bellantoni HUA on: 01/05/2021 10:55 AM   Modules accepted: Orders

## 2021-01-19 ENCOUNTER — Other Ambulatory Visit: Payer: Self-pay

## 2021-01-31 ENCOUNTER — Other Ambulatory Visit: Payer: Self-pay | Admitting: Family Medicine

## 2021-02-06 ENCOUNTER — Ambulatory Visit (INDEPENDENT_AMBULATORY_CARE_PROVIDER_SITE_OTHER): Payer: Medicare HMO | Admitting: Family Medicine

## 2021-02-06 ENCOUNTER — Other Ambulatory Visit: Payer: Self-pay

## 2021-02-06 VITALS — BP 150/70 | HR 82 | Temp 97.7°F | Wt 171.0 lb

## 2021-02-06 DIAGNOSIS — E118 Type 2 diabetes mellitus with unspecified complications: Secondary | ICD-10-CM | POA: Diagnosis not present

## 2021-02-06 DIAGNOSIS — R0600 Dyspnea, unspecified: Secondary | ICD-10-CM | POA: Diagnosis not present

## 2021-02-06 DIAGNOSIS — N1831 Chronic kidney disease, stage 3a: Secondary | ICD-10-CM | POA: Diagnosis not present

## 2021-02-06 DIAGNOSIS — I2699 Other pulmonary embolism without acute cor pulmonale: Secondary | ICD-10-CM

## 2021-02-06 DIAGNOSIS — E1165 Type 2 diabetes mellitus with hyperglycemia: Secondary | ICD-10-CM | POA: Diagnosis not present

## 2021-02-06 DIAGNOSIS — R0609 Other forms of dyspnea: Secondary | ICD-10-CM

## 2021-02-06 NOTE — Progress Notes (Signed)
Subjective:    Patient ID: Carla Wells, female    DOB: [birth year], 80 y.o.   MRN: [id number]  HPI Unfortunately, the patient was recently admitted with shortness of breath and was found to have multiple pulmonary emboli.  I have copied the discharge summary below: Admit date: 09/03/2020 Discharge date: 09/06/2020  Admitted From: Home Disposition:  Home  Recommendations for Outpatient Follow-up:  1. Follow up with PCP in 1-2 weeks 2. Follow up with Diabetes Education Coordinator within 1-2 weeks 3. Please obtain CMP/CBC, Mag, Phos in one week 4. Please follow up on the following pending results:  Home Health: No Equipment/Devices: None recommended by PT/OT  Discharge Condition: Stable CODE STATUS: FULL CODE Diet recommendation: Heart Healthy Carb Modified Diet   Brief/Interim Summary: The patient is a 80 year old obese African-American female with a past medical history significant for but not limited to hypertension, hyperlipidemia, chronic kidney disease stage III who came to the hospital with shortness of breath.  Shortness of breath have been going on for the past month and was worse last few days and she came to the emergency room.  CT angiogram showed PEs.  She is also found to be in DKA with new onset of diabetes mellitus type 2.  Hemoglobin A1c was 16.  Her blood sugars improved after she was transitioned to insulin and she tolerated heparin drip well and was transitioned to p.o. anticoagulation with apixaban.  We will give her free 30-day supply of apixaban and she was educated by the diabetes education coordinator on insulin administration and she will be discharged on NovoLog 70/30 given the prohibitive expense of the Lantus and Levemir.  Currently she is medically stable to be discharged as she did not desaturate on amatory home O2 screen and blood sugars have been fairly well controlled since that insulin regimen has been adjusted.  She will need to follow-up with PCP  within 1 week.  Discharge Diagnoses:  Principal Problem:   Acute pulmonary embolus (HCC) Active Problems:   Hypertension associated with diabetes ([place])   Diabetic ketoacidosis without coma associated with type 2 diabetes mellitus ([place])   Acute kidney injury superimposed on CKD ([place])   Hyperlipidemia associated with type 2 diabetes mellitus ([place])  Acute pulmonary Embolus -Large clot burden seen on CT scan with concern for right heart strain. No obvious provoking factors will stop PCCM evaluated patient but did not recommend lysis. -2D echo was done, showed EF 37-62%, grade 1 diastolic dysfunction, RV systolic function is normal with normal RV size.  -She has tolerated heparin well, discussed with patient this morning risks benefits of oral anticoagulation and per preference will start Eliquis. Case manager assisted with medication and she will get a free 30 day Supply -No DVT on LE Duplex -C/w Apixaban 10 mg po BID x7 days then 5 mg po BID -Did not Desaturate on Home O2 Screen   DKA, Type 2 diabetes mellitus-patient denies history of diabetes mellitus.She was initially started on insulin infusion, her gap is closed and was placed on Lantus 18 units as well as sliding scale. Fasting this morning is 210, gradually increasing towards lunch. Dr. Cruzita Lederer  added 5 units scheduled NovoLog and increase Lantus. Reviewed care management note, it appears that Lantus and Levemir are quite expensive however NovoLog was relatively well covered, and patient would probably benefit from NPH twice daily on discharge along with a NovoLog sliding scale. Have asked RN to do teaching for CBG check as well as insulin administration, awaiting  diabetes educator. Suspect may be able to be discharged today since CBG's are better -Will discharge on Novolog 70/30 16 units BID at the Diabetes Education Coordinator Recc's -Will need to follow up with Diabetes Education Coordinator within 1-2 weeks    Elevated troponin -no chest pain, this is likely demand ischemia due to large PE.  -Trending down with last Troponin I of 216  Acute kidney injury chronic kidney disease stage IIIa Metabolic Acidosis -Baseline creatinine around 1.1-1.2, up to 1.5 on admission, improved with fluids -BUN/Cr is now 18/1.11; Metabolic Acidosis is mild with a CO2 of 20, anion gap of 9, chloride level 175 rectally monitor and trend and repeat CMP in the outpatient setting  Hypokalemia -Mild at 3.4 -Replete with po KCl 40 mEQ BID x2 -Continue to Monitor and Replete as Necessary -Repeat CMP within 1 week  Essential hypertension -currently normotensive -Resume Home Amlodipine 10 mg po Daily   Hyperlipidemia -continue statin  Obesity -Complicates care and overall prognosis -Estimated body mass index is 35.08 kg/m as calculated from the following:   Height as of this encounter: _0  (1.626 m).   Weight as of this encounter: 92.7 kg. -Continued weight loss and dietary counseling  09/12/20 Patient is here today for follow-up.  My first concern is why did the patient form pulmonary emboli.  She has not had any recent surgery.  She has not had any recent immobilization or prolonged plane flight, etc.  She has no history of coagulopathy.  There are no family histories of DVT or PE.  She has never had a DVT before.  Patient did have Covid in December however that was 10 months ago.  That is her only hypercoagulable risk factor.  She is tolerating Eliquis well with no complications.  She denies any bleeding or bruising.  She has not been checking her sugars because she does not know how to use her meter.  I have my nurse educate the patient on how to use her meter and make sure she was comfortable performing sugar checks.  Today in the office her 2-hour postprandial sugar is excellent at 158.  She is currently taking 16 units of NPH twice a day.  She denies any symptoms of hypoglycemia.  She denies any  polyuria, polydipsia, or blurry vision.  At that time, my plan was: First regarding her treatment of pulmonary emboli.  I will check a CBC to evaluate for any drop in her hemoglobin since starting Eliquis.  Continue Eliquis as prescribed and after 10 days transition to 5 mg twice daily to complete 6 months of therapy.  However I feel that we need to start a hypercoagulable work-up.  Given her age, I do not believe she has an inherited hyper coagulable state such as factor V Leiden.  Therefore I do not feel that she requires this lab work.  I am concerned about possible occult malignancy.  Therefore I will schedule the patient for a CT scan of the abdomen and pelvis.  She is already had a CT angiogram of the chest which was negative for any underlying lung cancer etc.  Mammogram was done in June and was normal.  I will schedule the patient for a Cologuard as she has never had a colonoscopy.  We will do a Cologuard because I do not want to stop her anticoagulant unless absolutely necessary.  Regarding her diabetes, the patient will check her fasting blood sugar and her 2-hour postprandial sugars for the next week.  I will  see the patient back next week and we will review the readings.  If the patient's fasting blood sugars are under 130 and her 2-hour postprandial sugars are under 160 on just 32 units total of insulin, we may try to transition the patient to oral medication gradually and wean her off insulin.  Recheck next week  09/18/20 Patient's fasting blood sugars are averaging in the mid 140s.  Her 2-hour postprandial sugars are around 180.  She is not having any hypoglycemic episodes.  This is on 16 units of insulin twice daily.  She denies any polyuria, polydipsia, or blurry vision.  Her shortness of breath has improved.  She is more comfortable now checking her blood sugars.  However her most recent creatinine had risen to 1.39.  At that time, my plan JYN:WGNFAO insulin to 10 units twice daily and start  Metformin 500 mg p.o. twice daily.  In 2 weeks increase to 1000 mg twice daily assuming that she is tolerating the medication without diarrhea.  Monitor blood sugars.  If blood sugars drop below 100 she is to stop insulin immediately and notify me immediately.  Otherwise I would like to see the patient back in 3 weeks to review her blood sugars to see if we can transition her to oral medication and off insulin.  Goal fasting blood sugars will be less than 130.  Goal 2-hour postprandial sugars would be less than 160.  Monitor renal function closely given the addition of Metformin.  Monitor for any evidence of lactic acidosis or metabolic acidosis.  Recommended the patient drink a glass of water with each meal.  She has not been drinking as much lately.  Blood pressure is low so we will discontinue amlodipine and recheck blood pressure in 3 weeks.  10/09/20 Patient recently had a CAT scan that showed no malignancy in the abdomen.  Therefore I do not see any cancer the puts her in a hypercoagulable state causing her blood clot.  I believe this is most likely due to to her Covid infection.  She will complete her Eliquis in April.  That would complete 6 months of therapy.  She is currently on Metformin 500 mg twice a day and insulin 10 units twice a day.  Her blood sugars in the morning ranged from 83-117.  Her blood sugars 2-hour postprandial range from 104-173.  She has a few hypoglycemic episodes.  At that time, my plan was: I do not feel that the patient requires insulin at this time.  I will have her discontinue insulin and start Metformin 1000 mg twice daily.  She will notify me if her blood sugars rise above 250.  If not, I would recheck an A1c in 3 months as long as her blood sugar stay below 200.  Hopefully the Metformin alone will be able to control her sugars.  If not, given her chronic kidney disease, I would add Trulicity once a week.  Complete 6 months of Eliquis.  She will complete this in early April.   Recheck lab work in March.  02/06/21 Patient is here today for follow-up.  Patient has done a tremendous job checking her blood sugars.  Her fasting blood sugars are averaging between 101 120.  Her 2-hour postprandial sugars are typically between 120 and 160 with the vast majority under 150.  She is not having any hypoglycemic episodes.  She is no longer taking insulin.  Her blood pressure here today is elevated.  She is not checking her blood pressure  at home.  She has completed 5 months of anticoagulation therapy for her pulmonary embolism.  She is scheduled to discontinue Eliquis the first part of April.  However: She continues to have dyspnea on exertion.  She had an echocardiogram in October when she was diagnosed with a pulmonary embolism.  At that time she had elevated pulmonary artery pressures which I attributed to the pulmonary embolism.  Her ejection fraction was otherwise normal.  She denies any angina.  She denies any leg swelling or neuropathy. Past Medical History:  Diagnosis Date  . Abnormal pap   . Cataract   . Colon polyps   . Diabetes mellitus without complication ([place])   . Hyperlipidemia   . Hypertension   . Murmur    No past surgical history on file. Current Outpatient Medications on File Prior to Visit  Medication Sig Dispense Refill  . apixaban (ELIQUIS) 5 MG TABS tablet Take 1 tablet (5 mg total) by mouth 2 (two) times daily. 60 tablet 4  . Calcium Citrate-Vitamin D (CALCIUM + D PO) Take 1 tablet by mouth daily.    . Cholecalciferol (VITAMIN D3 PO) Take 1 tablet by mouth daily.    Marland Kitchen losartan-hydrochlorothiazide (HYZAAR) 50-12.5 MG tablet TAKE 1 TABLET EVERY DAY 90 tablet 3  . metFORMIN (GLUCOPHAGE) 500 MG tablet Take 2 tablets (1,000 mg total) by mouth 2 (two) times daily with a meal. 360 tablet 3  . omeprazole (PRILOSEC) 40 MG capsule TAKE 1 CAPSULE EVERY DAY 90 capsule 3  . simvastatin (ZOCOR) 10 MG tablet TAKE 1 TABLET AT BEDTIME 90 tablet 1  . albuterol (VENTOLIN  HFA) 108 (90 Base) MCG/ACT inhaler Inhale 2 puffs into the lungs every 4 (four) hours as needed for wheezing or shortness of breath. 6.7 g 0  . amLODipine (NORVASC) 10 MG tablet TAKE 1 TABLET EVERY DAY (Patient not taking: Reported on 02/06/2021) 90 tablet 1  . aspirin EC 81 MG tablet Take 81 mg by mouth daily. Swallow whole. (Patient not taking: No sig reported)    . blood glucose meter kit and supplies KIT Dispense based on patient and insurance preference. Use up to four times daily as directed. (FOR ICD-9 250.00, 250.01). 1 each 0  . insulin aspart protamine - aspart (NOVOLOG 70/30 FLEXPEN RELION) (70-30) 100 UNIT/ML FlexPen Inject 0.16 mLs (16 Units total) into the skin 2 (two) times daily. (Patient not taking: No sig reported) 15 mL 11  . Insulin Pen Needle 32G X 4 MM MISC 1 Container by Does not apply route in the morning and at bedtime. 100 each 0  . latanoprost (XALATAN) 0.005 % ophthalmic solution Place 1 drop into both eyes at bedtime.     Marland Kitchen MITIGARE 0.6 MG CAPS 2 tablets immediately then repeat 1 tablet 0.6 mg in 1 hour if pain persists.  Repeat this process daily until better (Patient not taking: Reported on 01/05/2021) 90 capsule 2  . predniSONE (DELTASONE) 20 MG tablet 3 tabs poqday 1-2, 2 tabs poqday 3-4, 1 tab poqday 5-6 (Patient not taking: Reported on 02/06/2021) 12 tablet 0   No current facility-administered medications on file prior to visit.   Allergies  Allergen Reactions  . Penicillins Swelling    Facial swelling   Social History   Socioeconomic History  . Marital status: Single    Spouse name: Not on file  . Number of children: Not on file  . Years of education: Not on file  . Highest education level: Not on file  Occupational  History  . Not on file  Tobacco Use  . Smoking status: Never Smoker  . Smokeless tobacco: Former Systems developer    Types: Snuff  Substance and Sexual Activity  . Alcohol use: No  . Drug use: No  . Sexual activity: Not on file  Other Topics Concern   . Not on file  Social History Narrative  . Not on file   Social Determinants of Health   Financial Resource Strain: Not on file  Food Insecurity: No Food Insecurity  . Worried About Charity fundraiser in the Last Year: Never true  . Ran Out of Food in the Last Year: Never true  Transportation Needs: No Transportation Needs  . Lack of Transportation (Medical): No  . Lack of Transportation (Non-Medical): No  Physical Activity: Not on file  Stress: Not on file  Social Connections: Not on file  Intimate Partner Violence: Not on file     Review of Systems  All other systems reviewed and are negative.      Objective:   Physical Exam Vitals reviewed.  Constitutional:      General: She is not in acute distress.    Appearance: Normal appearance. She is normal weight. She is not ill-appearing or toxic-appearing.  Cardiovascular:     Rate and Rhythm: Normal rate and regular rhythm.     Heart sounds: Normal heart sounds. No murmur heard. No gallop.   Pulmonary:     Effort: Pulmonary effort is normal. No respiratory distress.     Breath sounds: Normal breath sounds. No stridor. No wheezing, rhonchi or rales.  Abdominal:     General: Abdomen is flat. Bowel sounds are normal. There is no distension.     Palpations: Abdomen is soft.     Tenderness: There is no abdominal tenderness. There is no guarding or rebound.  Musculoskeletal:     Right lower leg: No edema.     Left lower leg: No edema.  Neurological:     General: No focal deficit present.     Mental Status: She is alert and oriented to person, place, and time. Mental status is at baseline.     Cranial Nerves: No cranial nerve deficit.     Motor: No weakness.     Coordination: Coordination normal.     Gait: Gait normal.           Assessment & Plan:  Controlled type 2 diabetes mellitus with complication, without long-term current use of insulin (HCC) - Plan: Hemoglobin A1c, CBC with Differential/Platelet, COMPLETE  METABOLIC PANEL WITH GFR, Microalbumin, urine  Pulmonary embolism and infarction (HCC)  Stage 3a chronic kidney disease (HCC)  Dyspnea on exertion - Plan: ECHOCARDIOGRAM COMPLETE  Blood pressure is elevated today.  Of asked the patient to check her blood pressure every day and call me back in 1 week.  If persistently elevated, I plan to add amlodipine to lower her blood pressure.  Her reported blood sugars are outstanding.  I will check an A1c however her sugar values sound well controlled.  I will monitor her chronic kidney disease with a CMP and I will monitor for any anemia on her anticoagulant with a CBC.  I am concerned about her dyspnea on exertion so I will repeat an echocardiogram to see if there is persistent pulmonary hypertension which would warrant a cardiology consultation.

## 2021-02-07 LAB — CBC WITH DIFFERENTIAL/PLATELET
Absolute Monocytes: 777 cells/uL (ref 200–950)
Basophils Absolute: 30 cells/uL (ref 0–200)
Basophils Relative: 0.4 %
Eosinophils Absolute: 37 cells/uL (ref 15–500)
Eosinophils Relative: 0.5 %
HCT: 39 % (ref 35.0–45.0)
Hemoglobin: 13.1 g/dL (ref 11.7–15.5)
Lymphs Abs: 2057 cells/uL (ref 850–3900)
MCH: 27.5 pg (ref 27.0–33.0)
MCHC: 33.6 g/dL (ref 32.0–36.0)
MCV: 81.9 fL (ref 80.0–100.0)
MPV: 11.9 fL (ref 7.5–12.5)
Monocytes Relative: 10.5 %
Neutro Abs: 4499 cells/uL (ref 1500–7800)
Neutrophils Relative %: 60.8 %
Platelets: 318 10*3/uL (ref 140–400)
RBC: 4.76 10*6/uL (ref 3.80–5.10)
RDW: 14.2 % (ref 11.0–15.0)
Total Lymphocyte: 27.8 %
WBC: 7.4 10*3/uL (ref 3.8–10.8)

## 2021-02-07 LAB — COMPLETE METABOLIC PANEL WITH GFR
AG Ratio: 1.4 (calc) (ref 1.0–2.5)
ALT: 13 U/L (ref 6–29)
AST: 13 U/L (ref 10–35)
Albumin: 3.9 g/dL (ref 3.6–5.1)
Alkaline phosphatase (APISO): 84 U/L (ref 37–153)
BUN/Creatinine Ratio: 19 (calc) (ref 6–22)
BUN: 22 mg/dL (ref 7–25)
CO2: 25 mmol/L (ref 20–32)
Calcium: 10.4 mg/dL (ref 8.6–10.4)
Chloride: 103 mmol/L (ref 98–110)
Creat: 1.17 mg/dL — ABNORMAL HIGH (ref 0.60–0.93)
GFR, Est African American: 51 mL/min/{1.73_m2} — ABNORMAL LOW (ref >=60)
GFR, Est Non African American: 44 mL/min/{1.73_m2} — ABNORMAL LOW (ref >=60)
Globulin: 2.8 g/dL (calc) (ref 1.9–3.7)
Glucose, Bld: 103 mg/dL — ABNORMAL HIGH (ref 65–99)
Potassium: 4.3 mmol/L (ref 3.5–5.3)
Sodium: 138 mmol/L (ref 135–146)
Total Bilirubin: 0.3 mg/dL (ref 0.2–1.2)
Total Protein: 6.7 g/dL (ref 6.1–8.1)

## 2021-02-07 LAB — HEMOGLOBIN A1C
Hgb A1c MFr Bld: 6.4 % of total Hgb — ABNORMAL HIGH (ref <5.7)
Mean Plasma Glucose: 137 mg/dL
eAG (mmol/L): 7.6 mmol/L

## 2021-02-07 LAB — MICROALBUMIN, URINE: Microalb, Ur: 0.8 mg/dL

## 2021-02-13 ENCOUNTER — Telehealth: Payer: Self-pay | Admitting: *Deleted

## 2021-02-13 ENCOUNTER — Other Ambulatory Visit: Payer: Self-pay

## 2021-02-13 MED ORDER — AMLODIPINE BESYLATE 5 MG PO TABS: 5.0000 mg | ORAL_TABLET | Freq: Every day | ORAL | 3 refills | Status: DC

## 2021-02-13 NOTE — Telephone Encounter (Signed)
Prescription sent to pharmacy.   Call placed to patient. No answer. No VM.

## 2021-02-13 NOTE — Telephone Encounter (Signed)
Received call from patient.   Reports that intermittent BP readings are as follows: 157/88 144/87 138/83 158/86  States that she is currently taking Losartan HCTZ 50/12.5mg .   Per OV note, plan was to add Amlodipine if BO remains elevated. Patient has Amlodipine 10mg  at home.   Please advise.

## 2021-02-13 NOTE — Telephone Encounter (Signed)
Start amlodipine 5 mg poqday.

## 2021-02-13 NOTE — Patient Instructions (Signed)
Goals    . RN CM [facility]-Make and Keep All Appointments     Timeframe:  Long-Range Goal Priority:  High Start Date:    11/08/20                         Expected End Date 08/31/21     Follow up date 05/31/21        - ask family or friend for a ride    Why is this important?   Part of staying healthy is seeing the doctor for follow-up care.  If you forget your appointments, there are some things you can do to stay on track.    Notes: 02/13/21 Daughter takes to appointments further from home.      . [facility]-Monitor and Manage My Blood Sugar     Timeframe:  Long-Range Goal Priority:  High Start Date:  11/08/20                           Expected End Date:  08/31/21      Follow up: 05/31/21   - check blood sugar before and after exercise - take the blood sugar log to all doctor visits    Why is this important?   Checking your blood sugar at home helps to keep it from getting very high or very low.  Writing the results in a diary or log helps the doctor know how to care for you.  Your blood sugar log should have the time, date and the results.  Also, write down the amount of insulin or other medicine that you take.  Other information, like what you ate, exercise done and how you were feeling, will also be helpful.     Notes: 02/14/11 Keep up the great work!!

## 2021-02-13 NOTE — Patient Outreach (Signed)
[place] [facility]) Care Management  [place]  02/13/2021   Carla Wells [birth year] [id number]  Subjective: Patient doing well. Patient saw PCP 02-06-21. A1c 6.4.  Congratulated patient on success. Encouraged to continue to manage her diabetes and begin more exercise as the weather gets warmer. She verbalized understanding.    Objective:   Encounter Medications:  Outpatient Encounter Medications as of 02/13/2021  Medication Sig  . albuterol (VENTOLIN HFA) 108 (90 Base) MCG/ACT inhaler Inhale 2 puffs into the lungs every 4 (four) hours as needed for wheezing or shortness of breath.  Marland Kitchen apixaban (ELIQUIS) 5 MG TABS tablet Take 1 tablet (5 mg total) by mouth 2 (two) times daily.  . blood glucose meter kit and supplies KIT Dispense based on patient and insurance preference. Use up to four times daily as directed. (FOR ICD-9 250.00, 250.01).  . Calcium Citrate-Vitamin D (CALCIUM + D PO) Take 1 tablet by mouth daily.  . Cholecalciferol (VITAMIN D3 PO) Take 1 tablet by mouth daily.  . Insulin Pen Needle 32G X 4 MM MISC 1 Container by Does not apply route in the morning and at bedtime.  Marland Kitchen latanoprost (XALATAN) 0.005 % ophthalmic solution Place 1 drop into both eyes at bedtime.   Marland Kitchen losartan-hydrochlorothiazide (HYZAAR) 50-12.5 MG tablet TAKE 1 TABLET EVERY DAY  . metFORMIN (GLUCOPHAGE) 500 MG tablet Take 2 tablets (1,000 mg total) by mouth 2 (two) times daily with a meal.  . omeprazole (PRILOSEC) 40 MG capsule TAKE 1 CAPSULE EVERY DAY  . simvastatin (ZOCOR) 10 MG tablet TAKE 1 TABLET AT BEDTIME   No facility-administered encounter medications on file as of 02/13/2021.    Functional Status:  In your present state of health, do you have any difficulty performing the following activities: 02/13/2021 12/19/2020  Hearing? N N  Vision? N N  Difficulty concentrating or making decisions? N N  Walking or climbing stairs? N N  Dressing or bathing? N N  Doing errands, shopping? N N   Preparing Food and eating ? N N  Using the Toilet? N N  In the past six months, have you accidently leaked urine? N N  Do you have problems with loss of bowel control? N N  Managing your Medications? N N  Managing your Finances? N N  Housekeeping or managing your Housekeeping? N N  Some recent data might be hidden    Fall/Depression Screening: Fall Risk  02/13/2021 01/05/2021 11/08/2020  Falls in the past year? 0 0 0  Number falls in past yr: - 0 0  Injury with Fall? - 0 0  Follow up - Falls evaluation completed -   PHQ 2/9 Scores 02/13/2021 12/19/2020 11/08/2020 10/19/2020 09/18/2020 09/07/2020 01/31/2020  PHQ - 2 Score 0 0 0 0 0 0 0  PHQ- 9 Score - - - - - - -    Assessment:  Goals Addressed            This Visit's Progress   . RN CM THN-Make and Keep All Appointments       Timeframe:  Long-Range Goal Priority:  High Start Date:    11/08/20                         Expected End Date 08/31/21     Follow up date 05/31/21        - ask family or friend for a ride    Why is this important?   Part of  staying healthy is seeing the doctor for follow-up care.  If you forget your appointments, there are some things you can do to stay on track.    Notes: 02/13/21 Daughter takes to appointments further from home.      . THN-Monitor and Manage My Blood Sugar       Timeframe:  Long-Range Goal Priority:  High Start Date:  11/08/20                           Expected End Date:  08/31/21      Follow up: 05/31/21   - check blood sugar before and after exercise - take the blood sugar log to all doctor visits    Why is this important?   Checking your blood sugar at home helps to keep it from getting very high or very low.  Writing the results in a diary or log helps the doctor know how to care for you.  Your blood sugar log should have the time, date and the results.  Also, write down the amount of insulin or other medicine that you take.  Other information, like what you ate, exercise  done and how you were feeling, will also be helpful.     Notes: 02/14/11 Keep up the great work!!       Plan: RN CM will contact in June.  Follow-up:  Patient agrees to Care Plan and Follow-up.   Jone Baseman, RN, MSN [place] [facility] Care Management Coordinator Direct Line [phone] Cell [phone] Toll Free: [phone]  Fax: [phone]

## 2021-02-14 NOTE — Telephone Encounter (Signed)
Call placed to patient and patient daughter Helene Kelp made aware.   Advised to call back in 2 weeks with BP readings.

## 2021-02-20 ENCOUNTER — Telehealth: Payer: Self-pay

## 2021-02-20 MED ORDER — METFORMIN HCL 500 MG PO TABS
1000.0000 mg | ORAL_TABLET | Freq: Two times a day (BID) | ORAL | 3 refills | Status: DC
Start: 2021-02-20 — End: 2022-02-27

## 2021-02-20 NOTE — Telephone Encounter (Signed)
Med refill metFORMIN (GLUCOPHAGE) 500 MG tablet   [facility] [address]  [place], [place] [address]  Phone:  [phone] Fax:  [phone]

## 2021-02-20 NOTE — Addendum Note (Signed)
Addended by: Sheral Flow on: 02/20/2021 04:49 PM   Modules accepted: Orders

## 2021-02-20 NOTE — Telephone Encounter (Signed)
Prescription sent to pharmacy.

## 2021-03-01 DIAGNOSIS — H906 Mixed conductive and sensorineural hearing loss, bilateral: Secondary | ICD-10-CM | POA: Diagnosis not present

## 2021-03-07 DIAGNOSIS — H906 Mixed conductive and sensorineural hearing loss, bilateral: Secondary | ICD-10-CM | POA: Diagnosis not present

## 2021-03-07 DIAGNOSIS — H6523 Chronic serous otitis media, bilateral: Secondary | ICD-10-CM | POA: Diagnosis not present

## 2021-03-08 ENCOUNTER — Other Ambulatory Visit: Payer: Self-pay

## 2021-03-08 ENCOUNTER — Ambulatory Visit (HOSPITAL_COMMUNITY): Payer: Medicare HMO | Attending: Cardiovascular Disease

## 2021-03-08 DIAGNOSIS — R0600 Dyspnea, unspecified: Secondary | ICD-10-CM | POA: Diagnosis not present

## 2021-03-08 DIAGNOSIS — R0609 Other forms of dyspnea: Secondary | ICD-10-CM

## 2021-03-08 LAB — ECHOCARDIOGRAM COMPLETE
Area-P 1/2: 2.66 cm2
S' Lateral: 2.7 cm

## 2021-03-21 ENCOUNTER — Telehealth: Payer: Self-pay | Admitting: Family Medicine

## 2021-03-21 NOTE — Telephone Encounter (Signed)
Received fax from [facility] requesting refill on apixaban (ELIQUIS) 5 MG TABS tablet

## 2021-03-22 ENCOUNTER — Encounter: Payer: Self-pay | Admitting: Family Medicine

## 2021-03-22 ENCOUNTER — Other Ambulatory Visit: Payer: Self-pay

## 2021-03-22 ENCOUNTER — Ambulatory Visit: Payer: Medicare HMO | Admitting: Family Medicine

## 2021-03-22 ENCOUNTER — Ambulatory Visit (INDEPENDENT_AMBULATORY_CARE_PROVIDER_SITE_OTHER): Payer: Medicare HMO | Admitting: Family Medicine

## 2021-03-22 VITALS — BP 142/66 | HR 98 | Temp 98.6°F | Resp 16 | Ht 64.0 in | Wt 168.0 lb

## 2021-03-22 DIAGNOSIS — M109 Gout, unspecified: Secondary | ICD-10-CM

## 2021-03-22 MED ORDER — PREDNISONE 20 MG PO TABS: ORAL_TABLET | ORAL | 0 refills | Status: DC

## 2021-03-22 NOTE — Telephone Encounter (Signed)
Patient is no longer on Eliquis.

## 2021-03-22 NOTE — Progress Notes (Signed)
Subjective:    Patient ID: Carla Wells, female    DOB: 1941/07/04, 80 y.o.   MRN: [id number]  HPI  Patient reports sudden onset of pain in her left wrist.  The wrist is red hot swollen and extremely tender.  She has had 3 gout exacerbations in the last 6 months.  This feels like her previous gout exacerbations.  She denies any falls or injuries.  This began yesterday.  She denies any fever or chills. Past Medical History:  Diagnosis Date  . Abnormal pap   . Cataract   . Colon polyps   . Diabetes mellitus without complication ([place])   . Hyperlipidemia   . Hypertension   . Murmur    History reviewed. No pertinent surgical history. Current Outpatient Medications on File Prior to Visit  Medication Sig Dispense Refill  . amLODipine (NORVASC) 5 MG tablet Take 1 tablet (5 mg total) by mouth daily. 90 tablet 3  . blood glucose meter kit and supplies KIT Dispense based on patient and insurance preference. Use up to four times daily as directed. (FOR ICD-9 250.00, 250.01). 1 each 0  . Calcium Citrate-Vitamin D (CALCIUM + D PO) Take 1 tablet by mouth daily.    . Cholecalciferol (VITAMIN D3 PO) Take 1 tablet by mouth daily.    . Insulin Pen Needle 32G X 4 MM MISC 1 Container by Does not apply route in the morning and at bedtime. 100 each 0  . latanoprost (XALATAN) 0.005 % ophthalmic solution Place 1 drop into both eyes at bedtime.     Marland Kitchen losartan-hydrochlorothiazide (HYZAAR) 50-12.5 MG tablet TAKE 1 TABLET EVERY DAY 90 tablet 3  . metFORMIN (GLUCOPHAGE) 500 MG tablet Take 2 tablets (1,000 mg total) by mouth 2 (two) times daily with a meal. 360 tablet 3  . omeprazole (PRILOSEC) 40 MG capsule TAKE 1 CAPSULE EVERY DAY 90 capsule 3  . simvastatin (ZOCOR) 10 MG tablet TAKE 1 TABLET AT BEDTIME 90 tablet 1   No current facility-administered medications on file prior to visit.   Allergies  Allergen Reactions  . Penicillins Swelling    Facial swelling   Social History   Socioeconomic History   . Marital status: Single    Spouse name: Not on file  . Number of children: Not on file  . Years of education: Not on file  . Highest education level: Not on file  Occupational History  . Not on file  Tobacco Use  . Smoking status: Never Smoker  . Smokeless tobacco: Former Systems developer    Types: Snuff  Substance and Sexual Activity  . Alcohol use: No  . Drug use: No  . Sexual activity: Not on file  Other Topics Concern  . Not on file  Social History Narrative  . Not on file   Social Determinants of Health   Financial Resource Strain: Not on file  Food Insecurity: No Food Insecurity  . Worried About Charity fundraiser in the Last Year: Never true  . Ran Out of Food in the Last Year: Never true  Transportation Needs: No Transportation Needs  . Lack of Transportation (Medical): No  . Lack of Transportation (Non-Medical): No  Physical Activity: Not on file  Stress: No Stress Concern Present  . Feeling of Stress : Not at all  Social Connections: Not on file  Intimate Partner Violence: Not on file     Review of Systems  All other systems reviewed and are negative.  Objective:   Physical Exam Vitals reviewed.  Constitutional:      Appearance: Normal appearance.  Cardiovascular:     Rate and Rhythm: Normal rate and regular rhythm.     Heart sounds: Normal heart sounds.  Pulmonary:     Effort: Pulmonary effort is normal.     Breath sounds: Normal breath sounds.  Musculoskeletal:     Left wrist: Swelling, effusion and tenderness present. Decreased range of motion.  Neurological:     Mental Status: She is alert.           Assessment & Plan:  Exacerbation of gout   Begin prednisone taper pack.  She will call back next week hopefully if the gout exacerbation has resolved and we will start her on allopurinol for prevention along with colchicine until uric acid levels fall below 6.  Recheck next week if no better or sooner if worse.

## 2021-03-28 ENCOUNTER — Telehealth: Payer: Self-pay

## 2021-03-28 DIAGNOSIS — M109 Gout, unspecified: Secondary | ICD-10-CM

## 2021-03-29 ENCOUNTER — Other Ambulatory Visit: Payer: Self-pay

## 2021-03-29 ENCOUNTER — Other Ambulatory Visit: Payer: Self-pay | Admitting: Family Medicine

## 2021-03-29 DIAGNOSIS — M109 Gout, unspecified: Secondary | ICD-10-CM

## 2021-03-29 MED ORDER — ALLOPURINOL 100 MG PO TABS: 100.0000 mg | ORAL_TABLET | Freq: Every day | ORAL | 6 refills | Status: DC

## 2021-03-29 MED ORDER — ALLOPURINOL 100 MG PO TABS: 100.0000 mg | ORAL_TABLET | Freq: Two times a day (BID) | ORAL | 0 refills | Status: DC

## 2021-03-29 MED ORDER — COLCHICINE 0.6 MG PO TABS: 0.6000 mg | ORAL_TABLET | Freq: Every day | ORAL | 0 refills | Status: DC

## 2021-03-29 NOTE — Telephone Encounter (Signed)
Pt verbalized understanding.

## 2021-03-29 NOTE — Telephone Encounter (Signed)
Start allopurinol 200 mg poqday and colchicine 0.6 mg poqday and recheck uric acid level in 6 weks here. If less than6 at that time,  we will stop colchicine and uses allopurinol alone.

## 2021-03-30 ENCOUNTER — Other Ambulatory Visit: Payer: Self-pay

## 2021-03-30 MED ORDER — COLCHICINE 0.6 MG PO CAPS: ORAL_CAPSULE | ORAL | 0 refills | Status: AC

## 2021-03-30 NOTE — Telephone Encounter (Signed)
Sent mitigare which is formulary drug that insurance will cover

## 2021-04-02 ENCOUNTER — Other Ambulatory Visit: Payer: Self-pay | Admitting: Family Medicine

## 2021-04-18 DIAGNOSIS — H903 Sensorineural hearing loss, bilateral: Secondary | ICD-10-CM | POA: Diagnosis not present

## 2021-05-07 ENCOUNTER — Telehealth: Payer: Self-pay | Admitting: Family Medicine

## 2021-05-07 DIAGNOSIS — M109 Gout, unspecified: Secondary | ICD-10-CM

## 2021-05-07 MED ORDER — AMLODIPINE BESYLATE 10 MG PO TABS: 1.0000 | ORAL_TABLET | Freq: Every day | ORAL | 1 refills | Status: DC

## 2021-05-07 MED ORDER — ALLOPURINOL 100 MG PO TABS: 100.0000 mg | ORAL_TABLET | Freq: Two times a day (BID) | ORAL | 1 refills | Status: DC

## 2021-05-07 NOTE — Telephone Encounter (Signed)
[facility] pharmacy called in requesting a refill for this pt of allopurinol (ZYLOPRIM) 100 MG  amLODipine (NORVASC) 10 MG tablet

## 2021-05-07 NOTE — Telephone Encounter (Signed)
Prescription sent to pharmacy.

## 2021-05-15 ENCOUNTER — Other Ambulatory Visit: Payer: Self-pay

## 2021-05-15 NOTE — Patient Instructions (Signed)
Goals Addressed             This Visit's Progress    COMPLETED: RN CM THN-Make and Keep All Appointments   On track    Timeframe:  Long-Range Goal Priority:  High Start Date:    11/08/20                         Expected End Date 08/31/21     Follow up date 05/31/21        - ask family or friend for a ride    Why is this important?   Part of staying healthy is seeing the doctor for follow-up care.  If you forget your appointments, there are some things you can do to stay on track.    Notes: 02/13/21 Daughter takes to appointments further from home.        THN-Monitor and Manage My Blood Sugar   On track    Timeframe:  Long-Range Goal Priority:  High Start Date:  11/08/20                           Expected End Date:  08/31/21      Follow up: 05/31/21   - check blood sugar before and after exercise - take the blood sugar log to all doctor visits    Why is this important?   Checking your blood sugar at home helps to keep it from getting very high or very low.  Writing the results in a diary or log helps the doctor know how to care for you.  Your blood sugar log should have the time, date and the results.  Also, write down the amount of insulin or other medicine that you take.  Other information, like what you ate, exercise done and how you were feeling, will also be helpful.     Notes: 02/14/11 Keep up the great work!! 6/14/ A1c last 6.4.   Diabetes Management Discussed: Medication adherence Reviewed importance of limiting carbs such as rice, potatoes, breads, and pastas. Also discussed limiting sweets and sugary drinks.  Discussed importance of portion control.  Also discussed importance of annual exams such as mammogram and eye exam.

## 2021-05-15 NOTE — Patient Outreach (Signed)
[facility] [place] [facility]) Care Management  [place]  05/15/2021   ADALIS GATTI 1941-04-07 [id number]  Subjective: Telephone call to patient for diabetes management. Patient reports doing better.  She reports a spell with gout but is ok now.  Discussed diabetes management.    Objective:   Encounter Medications:  Outpatient Encounter Medications as of 05/15/2021  Medication Sig   allopurinol (ZYLOPRIM) 100 MG tablet Take 1 tablet (100 mg total) by mouth 2 (two) times daily.   amLODipine (NORVASC) 10 MG tablet Take 1 tablet (10 mg total) by mouth daily.   blood glucose meter kit and supplies KIT Dispense based on patient and insurance preference. Use up to four times daily as directed. (FOR ICD-9 250.00, 250.01).   Calcium Citrate-Vitamin D (CALCIUM + D PO) Take 1 tablet by mouth daily.   Cholecalciferol (VITAMIN D3 PO) Take 1 tablet by mouth daily.   Colchicine (MITIGARE) 0.6 MG CAPS Take one tablet by mouth daily   Insulin Pen Needle 32G X 4 MM MISC 1 Container by Does not apply route in the morning and at bedtime.   latanoprost (XALATAN) 0.005 % ophthalmic solution Place 1 drop into both eyes at bedtime.    losartan-hydrochlorothiazide (HYZAAR) 50-12.5 MG tablet TAKE 1 TABLET EVERY DAY   metFORMIN (GLUCOPHAGE) 500 MG tablet Take 2 tablets (1,000 mg total) by mouth 2 (two) times daily with a meal.   omeprazole (PRILOSEC) 40 MG capsule TAKE 1 CAPSULE EVERY DAY   predniSONE (DELTASONE) 20 MG tablet 3 tabs poqday 1-2, 2 tabs poqday 3-4, 1 tab poqday 5-6   simvastatin (ZOCOR) 10 MG tablet TAKE 1 TABLET AT BEDTIME   No facility-administered encounter medications on file as of 05/15/2021.    Functional Status:  In your present state of health, do you have any difficulty performing the following activities: 05/15/2021 02/13/2021  Hearing? N N  Vision? N N  Difficulty concentrating or making decisions? N N  Walking or climbing stairs? N N  Dressing or bathing? N N  Doing  errands, shopping? N N  Preparing Food and eating ? N N  Using the Toilet? N N  In the past six months, have you accidently leaked urine? - N  Do you have problems with loss of bowel control? N N  Managing your Medications? N N  Managing your Finances? N N  Housekeeping or managing your Housekeeping? N N  Some recent data might be hidden    Fall/Depression Screening: Fall Risk  05/15/2021 03/22/2021 02/13/2021  Falls in the past year? 0 0 0  Number falls in past yr: - - -  Injury with Fall? - - -  Risk for fall due to : - No Fall Risks -  Follow up - Falls evaluation completed -   PHQ 2/9 Scores 05/15/2021 03/22/2021 02/13/2021 12/19/2020 11/08/2020 10/19/2020 09/18/2020  PHQ - 2 Score 0 0 0 0 0 0 0  PHQ- 9 Score - - - - - - -    Assessment:   Care Plan Care Plan : Diabetes Type 2 (Adult)  Updates made by Jon Billings, RN since 05/15/2021 12:00 AM     Problem: Disease Progression (Diabetes, Type 2)   Priority: High  Note:   Patient with newly diagnosed diabetes.      Long-Range Goal: Disease Progression Prevented or Minimized as evidenced by A1c less than 7.0   Start Date: 09/07/2020  Expected End Date: 08/31/2021  This Visit's Progress: On track  Recent Progress: On  track  Priority: High  Note:   Evidence-based guidance:  Encourage lifestyle changes, such as increased intake of plant-based foods, stress reduction, consistent physical activity, and smoking cessation to prevent  long-term complications and chronic diseases.  Individualize activity and exercise recommendations while considering  potential limitations, such as neuropathy, retinopathy, or the ability to prevent  hyperglycemia or hypoglycemia. Ensure completion of annual comprehensive foot exam and dilated eye exam.  Notes: 02/13/21 A1c 6.4.     Task: Monitor and Manage Follow-up for Comorbidities   Due Date: 08/31/2021  Priority: Routine  Responsible User: Jon Billings, RN  Note:   Care Management  Activities:    - healthy lifestyle promoted - response to pharmacologic therapy monitored    Notes: Encouraged logging blood sugars and exercise.  02/13/21 Patient exercising more now.   05/15/21 Patient reports that her sugar has trended higher due to gout medication but this morning was 103.        Goals Addressed             This Visit's Progress    COMPLETED: RN CM THN-Make and Keep All Appointments   On track    Timeframe:  Long-Range Goal Priority:  High Start Date:    11/08/20                         Expected End Date 08/31/21     Follow up date 05/31/21        - ask family or friend for a ride    Why is this important?   Part of staying healthy is seeing the doctor for follow-up care.  If you forget your appointments, there are some things you can do to stay on track.    Notes: 02/13/21 Daughter takes to appointments further from home.        THN-Monitor and Manage My Blood Sugar   On track    Timeframe:  Long-Range Goal Priority:  High Start Date:  11/08/20                           Expected End Date:  08/31/21      Follow up: 05/31/21   - check blood sugar before and after exercise - take the blood sugar log to all doctor visits    Why is this important?   Checking your blood sugar at home helps to keep it from getting very high or very low.  Writing the results in a diary or log helps the doctor know how to care for you.  Your blood sugar log should have the time, date and the results.  Also, write down the amount of insulin or other medicine that you take.  Other information, like what you ate, exercise done and how you were feeling, will also be helpful.     Notes: 02/14/11 Keep up the great work!! 6/14/ A1c last 6.4.   Diabetes Management Discussed: Medication adherence Reviewed importance of limiting carbs such as rice, potatoes, breads, and pastas. Also discussed limiting sweets and sugary drinks.  Discussed importance of portion control.  Also  discussed importance of annual exams such as mammogram and eye exam.           Plan:  Follow-up: Patient agrees to Care Plan and Follow-up. Follow-up in 3 month(s)   Jone Baseman, RN, MSN [facility] Care Management Coordinator Direct Line [phone] Cell [phone] Toll Free: [phone]  Fax: [phone]

## 2021-05-17 NOTE — Progress Notes (Signed)
Subjective:   Carla Wells is a 80 y.o. female who presents for Medicare Annual (Subsequent) preventive examination.  Review of Systems    N/A  Cardiac Risk Factors include: advanced age (>87mn, >>29women);hypertension;diabetes mellitus;dyslipidemia     Objective:    Today's Vitals   05/18/21 0949  BP: (!) 114/58  Temp: 98 F (36.7 C)  TempSrc: Oral  Weight: 168 lb 2 oz (76.3 kg)  Height: '5\' 4"'  (1.626 m)   Body mass index is 28.86 kg/m.  Advanced Directives 05/18/2021 11/08/2020 09/07/2020 09/04/2020 09/17/2018  Does Patient Have a Medical Advance Directive? Yes Yes Yes Yes No  Type of Advance Directive Living will Healthcare Power of [place]-  Does patient want to make changes to medical advance directive? No - Patient declined No - Patient declined No - Patient declined No - Patient declined -  Copy of [place] Chart? - - No - copy requested No - copy requested -    Current Medications (verified) Outpatient Encounter Medications as of 05/18/2021  Medication Sig   allopurinol (ZYLOPRIM) 100 MG tablet Take 1 tablet (100 mg total) by mouth 2 (two) times daily.   amLODipine (NORVASC) 10 MG tablet Take 1 tablet (10 mg total) by mouth daily.   blood glucose meter kit and supplies KIT Dispense based on patient and insurance preference. Use up to four times daily as directed. (FOR ICD-9 250.00, 250.01).   Calcium Citrate-Vitamin D (CALCIUM + D PO) Take 1 tablet by mouth daily.   Cholecalciferol (VITAMIN D3 PO) Take 1 tablet by mouth daily.   Colchicine (MITIGARE) 0.6 MG CAPS Take one tablet by mouth daily   Insulin Pen Needle 32G X 4 MM MISC 1 Container by Does not apply route in the morning and at bedtime.   latanoprost (XALATAN) 0.005 % ophthalmic solution Place 1 drop into both eyes at bedtime.    losartan-hydrochlorothiazide (HYZAAR) 50-12.5 MG tablet TAKE 1 TABLET EVERY DAY   metFORMIN  (GLUCOPHAGE) 500 MG tablet Take 2 tablets (1,000 mg total) by mouth 2 (two) times daily with a meal.   omeprazole (PRILOSEC) 40 MG capsule TAKE 1 CAPSULE EVERY DAY   simvastatin (ZOCOR) 10 MG tablet TAKE 1 TABLET AT BEDTIME   [DISCONTINUED] predniSONE (DELTASONE) 20 MG tablet 3 tabs poqday 1-2, 2 tabs poqday 3-4, 1 tab poqday 5-6   No facility-administered encounter medications on file as of 05/18/2021.    Allergies (verified) Penicillins   History: Past Medical History:  Diagnosis Date   Abnormal pap    Cataract    Colon polyps    Diabetes mellitus without complication ([place]    Hyperlipidemia    Hypertension    Murmur    History reviewed. No pertinent surgical history. Family History  Problem Relation Age of Onset   Stroke Mother    Kidney failure Father    Diabetes Brother    Cancer Brother        Lung - Smoker   Lung cancer Brother    Cervical cancer Sister    Cancer Sister    Cancer Sister        ?Cervical CA   Social History   Socioeconomic History   Marital status: Single    Spouse name: Not on file   Number of children: Not on file   Years of education: Not on file   Highest education level: Not on file  Occupational History  Not on file  Tobacco Use   Smoking status: Never   Smokeless tobacco: Former    Types: Snuff  Substance and Sexual Activity   Alcohol use: No   Drug use: No   Sexual activity: Not on file  Other Topics Concern   Not on file  Social History Narrative   Not on file   Social Determinants of Health   Financial Resource Strain: Low Risk    Difficulty of Paying Living Expenses: Not hard at all  Food Insecurity: No Food Insecurity   Worried About Charity fundraiser in the Last Year: Never true   [place] in the Last Year: Never true  Transportation Needs: No Transportation Needs   Lack of Transportation (Medical): No   Lack of Transportation (Non-Medical): No  Physical Activity: Sufficiently Active   Days of Exercise  per Week: 7 days   Minutes of Exercise per Session: 30 min  Stress: No Stress Concern Present   Feeling of Stress : Not at all  Social Connections: Moderately Isolated   Frequency of Communication with Friends and Family: Three times a week   Frequency of Social Gatherings with Friends and Family: Twice a week   Attends Religious Services: More than 4 times per year   Active Member of [employer] or Organizations: No   Attends Music therapist: Never   Marital Status: Divorced    Tobacco Counseling Counseling given: Not Answered   Clinical Intake:  Pre-visit preparation completed: Yes  Pain : No/denies pain     Nutritional Risks: None Diabetes: Yes CBG done?: No Did pt. bring in CBG monitor from home?: No  How often do you need to have someone help you when you read instructions, pamphlets, or other written materials from your doctor or pharmacy?: 1 - Never  Diabetic?Yes Nutrition Risk Assessment:  Has the patient had any N/V/D within the last 2 months?  No  Does the patient have any non-healing wounds?  No  Has the patient had any unintentional weight loss or weight gain?  No   Diabetes:  Is the patient diabetic?  Yes  If diabetic, was a CBG obtained today?  No  Did the patient bring in their glucometer from home?  No  How often do you monitor your CBG's? Patient states checks glucose once in the morning and once at night .   Financial Strains and Diabetes Management:  Are you having any financial strains with the device, your supplies or your medication? No .  Does the patient want to be seen by Chronic Care Management for management of their diabetes?  No  Would the patient like to be referred to a Nutritionist or for Diabetic Management?  No   Diabetic Exams:  Diabetic Eye Exam: Overdue for diabetic eye exam. Pt has been advised about the importance in completing this exam. Patient advised to call and schedule an eye exam. Diabetic Foot Exam: Overdue,  Pt has been advised about the importance in completing this exam. Pt is scheduled for diabetic foot exam on next office visit.   Interpreter Needed?: No  Information entered by :: [place] of Daily Living In your present state of health, do you have any difficulty performing the following activities: 05/18/2021 05/15/2021  Hearing? Y N  Vision? N N  Difficulty concentrating or making decisions? N N  Walking or climbing stairs? N N  Dressing or bathing? N N  Doing errands, shopping? N N  Preparing  Food and eating ? N N  Using the Toilet? N N  In the past six months, have you accidently leaked urine? N -  Do you have problems with loss of bowel control? N N  Managing your Medications? N N  Managing your Finances? N N  Housekeeping or managing your Housekeeping? N N  Some recent data might be hidden    Patient Care Team: Susy Frizzle, MD as PCP - General (Family Medicine) Jon Billings, RN as [place] any recent [place] you may have received from other than [facility] providers in the past year (date may be approximate).     Assessment:   This is a routine wellness examination for [place].  Hearing/Vision screen Vision Screening - Comments:: Patient states gets eyes examined once per year. Currently wears reading glasses   Dietary issues and exercise activities discussed: Current Exercise Habits: Home exercise routine, Type of exercise: walking, Time (Minutes): 25, Frequency (Times/Week): 7, Weekly Exercise (Minutes/Week): 175, Intensity: Moderate, Exercise limited by: None identified   Goals Addressed   None    Depression Screen PHQ 2/9 Scores 05/18/2021 05/15/2021 03/22/2021 02/13/2021 12/19/2020 11/08/2020 10/19/2020  PHQ - 2 Score 0 0 0 0 0 0 0  PHQ- 9 Score - - - - - - -    Fall Risk Fall Risk  05/18/2021 05/15/2021 03/22/2021 02/13/2021 01/05/2021  Falls in the past year? 0 0 0 0 0  Number falls in past yr: 0  - - - 0  Injury with Fall? 0 - - - 0  Risk for fall due to : No Fall Risks - No Fall Risks - -  Follow up Falls evaluation completed;Falls prevention discussed - Falls evaluation completed - Falls evaluation completed    FALL RISK PREVENTION PERTAINING TO THE HOME:  Any stairs in or around the home? No  If so, are there any without handrails? No  Home free of loose throw rugs in walkways, pet beds, electrical cords, etc? Yes  Adequate lighting in your home to reduce risk of falls? Yes   ASSISTIVE DEVICES UTILIZED TO PREVENT FALLS:  Life alert? No  Use of a cane, walker or w/c? No  Grab bars in the bathroom? No  Shower chair or bench in shower? No  Elevated toilet seat or a handicapped toilet? No   TIMED UP AND GO:  Was the test performed? Yes .  Length of time to ambulate 10 feet: 3 sec.   Gait steady and fast without use of assistive device  Cognitive Function:    Normal cognitive status assessed by direct observation by this Nurse Health Advisor. No abnormalities found.      Immunizations Immunization History  Administered Date(s) Administered   Fluad Quad(high Dose 65+) 08/27/2019, 09/11/2020   Influenza Split 11/06/2012   Influenza, High Dose Seasonal PF 09/09/2018   Influenza,inj,Quad PF,6+ Mos 09/16/2013, 09/06/2014, 09/07/2015, 09/26/2016, 09/11/2017   PFIZER(Purple Top)SARS-COV-2 Vaccination 01/11/2020, 02/05/2020, 03/13/2021   Pneumococcal Conjugate-13 11/04/2013   Pneumococcal Polysaccharide-23 10/03/2010    TDAP status: Due, Education has been provided regarding the importance of this vaccine. Advised may receive this vaccine at local pharmacy or Health Dept. Aware to provide a copy of the vaccination record if obtained from local pharmacy or Health Dept. Verbalized acceptance and understanding.  Flu Vaccine status: Up to date  Pneumococcal vaccine status: Up to date  Covid-19 vaccine status: Completed vaccines  Qualifies for Shingles Vaccine? Yes    Zostavax completed No  Shingrix Completed?: No.    Education has been provided regarding the importance of this vaccine. Patient has been advised to call insurance company to determine out of pocket expense if they have not yet received this vaccine. Advised may also receive vaccine at local pharmacy or Health Dept. Verbalized acceptance and understanding.  Screening Tests Health Maintenance  Topic Date Due   FOOT EXAM  Never done   OPHTHALMOLOGY EXAM  Never done   Hepatitis C Screening  Never done   TETANUS/TDAP  Never done   Zoster Vaccines- Shingrix (1 of 2) Never done   MAMMOGRAM  05/26/2021   INFLUENZA VACCINE  07/02/2021   COVID-19 Vaccine (4 - Booster for Pfizer series) 07/13/2021   HEMOGLOBIN A1C  08/09/2021   DEXA SCAN  Completed   PNA vac Low Risk Adult  Completed   HPV VACCINES  Aged Out    Health Maintenance  Health Maintenance Due  Topic Date Due   FOOT EXAM  Never done   OPHTHALMOLOGY EXAM  Never done   Hepatitis C Screening  Never done   TETANUS/TDAP  Never done   Zoster Vaccines- Shingrix (1 of 2) Never done    Colorectal cancer screening: No longer required.   Mammogram status: Completed 05/26/2020. Repeat every year  Bone Density status: Completed 05/25/2019. Results reflect: Bone density results: OSTEOPENIA. Repeat every 5 years.  Lung Cancer Screening: (Low Dose CT Chest recommended if Age 72-80 years, 30 pack-year currently smoking OR have quit w/in 15years.) does not qualify.   Lung Cancer Screening Referral: N/A   Additional Screening:  Hepatitis C Screening: does qualify;   Vision Screening: Recommended annual ophthalmology exams for early detection of glaucoma and other disorders of the eye. Is the patient up to date with their annual eye exam?  Yes  Who is the provider or what is the name of the office in which the patient attends annual eye exams? Dr. Kathlen Mody If pt is not established with a provider, would they like to be referred to a  provider to establish care? No .   Dental Screening: Recommended annual dental exams for proper oral hygiene  Community Resource Referral / Chronic Care Management: CRR required this visit?  No   CCM required this visit?  No      Plan:     I have personally reviewed and noted the following in the patient's chart:   Medical and social history Use of alcohol, tobacco or illicit drugs  Current medications and supplements including opioid prescriptions.  Functional ability and status Nutritional status Physical activity Advanced directives List of other physicians Hospitalizations, surgeries, and ER visits in previous 12 months Vitals Screenings to include cognitive, depression, and falls Referrals and appointments  In addition, I have reviewed and discussed with patient certain preventive protocols, quality metrics, and best practice recommendations. A written personalized care plan for preventive services as well as general preventive health recommendations were provided to patient.     Ofilia Neas, LPN   3/00/5110   Nurse Notes: None

## 2021-05-18 ENCOUNTER — Ambulatory Visit (INDEPENDENT_AMBULATORY_CARE_PROVIDER_SITE_OTHER): Payer: Medicare HMO

## 2021-05-18 ENCOUNTER — Other Ambulatory Visit: Payer: Self-pay

## 2021-05-18 VITALS — BP 114/58 | Temp 98.0°F | Ht 64.0 in | Wt 168.1 lb

## 2021-05-18 DIAGNOSIS — Z0000 Encounter for general adult medical examination without abnormal findings: Secondary | ICD-10-CM

## 2021-05-18 NOTE — Patient Instructions (Signed)
[name] , Thank you for taking time to come for your Medicare Wellness Visit. I appreciate your ongoing commitment to your health goals. Please review the following plan we discussed and let me know if I can assist you in the future.   Screening recommendations/referrals: Colonoscopy: No longer required Mammogram: Up to date, next due 05/26/2021 Bone Density: Up to date, next due 05/2024 Recommended yearly ophthalmology/optometry visit for glaucoma screening and checkup Recommended yearly dental visit for hygiene and checkup  Vaccinations: Influenza vaccine: Up to date, next due fall 2022  Pneumococcal vaccine: Completed series Tdap vaccine: Currently due, you may await and injury to receive  Shingles vaccine: Currently due if you would like to receive we recommend that you do so at your local pharmacy    Advanced directives: Please bring in copies of your living will so that we may scan them into your chart  Conditions/risks identified: None   Next appointment: 05/24/2022 @ 9:45 am with [place] 80 Years and Older, Female Preventive care refers to lifestyle choices and visits with your health care provider that can promote health and wellness. What does preventive care include? A yearly physical exam. This is also called an annual well check. Dental exams once or twice a year. Routine eye exams. Ask your health care provider how often you should have your eyes checked. Personal lifestyle choices, including: Daily care of your teeth and gums. Regular physical activity. Eating a healthy diet. Avoiding tobacco and drug use. Limiting alcohol use. Practicing safe sex. Taking low-dose aspirin every day. Taking vitamin and mineral supplements as recommended by your health care provider. What happens during an annual well check? The services and screenings done by your health care provider during your annual well check will depend on your age, overall  health, lifestyle risk factors, and family history of disease. Counseling  Your health care provider may ask you questions about your: Alcohol use. Tobacco use. Drug use. Emotional well-being. Home and relationship well-being. Sexual activity. Eating habits. History of falls. Memory and ability to understand (cognition). Work and work Statistician. Reproductive health. Screening  You may have the following tests or measurements: Height, weight, and BMI. Blood pressure. Lipid and cholesterol levels. These may be checked every 5 years, or more frequently if you are over 80 years old. Skin check. Lung cancer screening. You may have this screening every year starting at age 80 if you have a 30-pack-year history of smoking and currently smoke or have quit within the past 15 years. Fecal occult blood test (FOBT) of the stool. You may have this test every year starting at age 80. Flexible sigmoidoscopy or colonoscopy. You may have a sigmoidoscopy every 5 years or a colonoscopy every 10 years starting at age 80. Hepatitis C blood test. Hepatitis B blood test. Sexually transmitted disease (STD) testing. Diabetes screening. This is done by checking your blood sugar (glucose) after you have not eaten for a while (fasting). You may have this done every 1-3 years. Bone density scan. This is done to screen for osteoporosis. You may have this done starting at age 80. Mammogram. This may be done every 1-2 years. Talk to your health care provider about how often you should have regular mammograms. Talk with your health care provider about your test results, treatment options, and if necessary, the need for more tests. Vaccines  Your health care provider may recommend certain vaccines, such as: Influenza vaccine. This is recommended every year. Tetanus,  diphtheria, and acellular pertussis (Tdap, Td) vaccine. You may need a Td booster every 10 years. Zoster vaccine. You may need this after age  18. Pneumococcal 13-valent conjugate (PCV13) vaccine. One dose is recommended after age 80. Pneumococcal polysaccharide (PPSV23) vaccine. One dose is recommended after age 80. Talk to your health care provider about which screenings and vaccines you need and how often you need them. This information is not intended to replace advice given to you by your health care provider. Make sure you discuss any questions you have with your health care provider. Document Released: 12/15/2015 Document Revised: 08/07/2016 Document Reviewed: 09/19/2015 Elsevier Interactive Patient Education  2017 [place] Prevention in the Home Falls can cause injuries. They can happen to people of all ages. There are many things you can do to make your home safe and to help prevent falls. What can I do on the outside of my home? Regularly fix the edges of walkways and driveways and fix any cracks. Remove anything that might make you trip as you walk through a door, such as a raised step or threshold. Trim any bushes or trees on the path to your home. Use bright outdoor lighting. Clear any walking paths of anything that might make someone trip, such as rocks or tools. Regularly check to see if handrails are loose or broken. Make sure that both sides of any steps have handrails. Any raised decks and porches should have guardrails on the edges. Have any leaves, snow, or ice cleared regularly. Use sand or salt on walking paths during winter. Clean up any spills in your garage right away. This includes oil or grease spills. What can I do in the bathroom? Use night lights. Install grab bars by the toilet and in the tub and shower. Do not use towel bars as grab bars. Use non-skid mats or decals in the tub or shower. If you need to sit down in the shower, use a plastic, non-slip stool. Keep the floor dry. Clean up any water that spills on the floor as soon as it happens. Remove soap buildup in the tub or shower  regularly. Attach bath mats securely with double-sided non-slip rug tape. Do not have throw rugs and other things on the floor that can make you trip. What can I do in the bedroom? Use night lights. Make sure that you have a light by your bed that is easy to reach. Do not use any sheets or blankets that are too big for your bed. They should not hang down onto the floor. Have a firm chair that has side arms. You can use this for support while you get dressed. Do not have throw rugs and other things on the floor that can make you trip. What can I do in the kitchen? Clean up any spills right away. Avoid walking on wet floors. Keep items that you use a lot in easy-to-reach places. If you need to reach something above you, use a strong step stool that has a grab bar. Keep electrical cords out of the way. Do not use floor polish or wax that makes floors slippery. If you must use wax, use non-skid floor wax. Do not have throw rugs and other things on the floor that can make you trip. What can I do with my stairs? Do not leave any items on the stairs. Make sure that there are handrails on both sides of the stairs and use them. Fix handrails that are broken or loose. Make  sure that handrails are as long as the stairways. Check any carpeting to make sure that it is firmly attached to the stairs. Fix any carpet that is loose or worn. Avoid having throw rugs at the top or bottom of the stairs. If you do have throw rugs, attach them to the floor with carpet tape. Make sure that you have a light switch at the top of the stairs and the bottom of the stairs. If you do not have them, ask someone to add them for you. What else can I do to help prevent falls? Wear shoes that: Do not have high heels. Have rubber bottoms. Are comfortable and fit you well. Are closed at the toe. Do not wear sandals. If you use a stepladder: Make sure that it is fully opened. Do not climb a closed stepladder. Make sure that  both sides of the stepladder are locked into place. Ask someone to hold it for you, if possible. Clearly mark and make sure that you can see: Any grab bars or handrails. First and last steps. Where the edge of each step is. Use tools that help you move around (mobility aids) if they are needed. These include: Canes. Walkers. Scooters. Crutches. Turn on the lights when you go into a dark area. Replace any light bulbs as soon as they burn out. Set up your furniture so you have a clear path. Avoid moving your furniture around. If any of your floors are uneven, fix them. If there are any pets around you, be aware of where they are. Review your medicines with your doctor. Some medicines can make you feel dizzy. This can increase your chance of falling. Ask your doctor what other things that you can do to help prevent falls. This information is not intended to replace advice given to you by your health care provider. Make sure you discuss any questions you have with your health care provider. Document Released: 09/14/2009 Document Revised: 04/25/2016 Document Reviewed: 12/23/2014 Elsevier Interactive Patient Education  2017 [employer].

## 2021-05-31 DIAGNOSIS — H6523 Chronic serous otitis media, bilateral: Secondary | ICD-10-CM | POA: Diagnosis not present

## 2021-05-31 DIAGNOSIS — H906 Mixed conductive and sensorineural hearing loss, bilateral: Secondary | ICD-10-CM | POA: Diagnosis not present

## 2021-06-01 DIAGNOSIS — Z1231 Encounter for screening mammogram for malignant neoplasm of breast: Secondary | ICD-10-CM | POA: Diagnosis not present

## 2021-06-13 DIAGNOSIS — H6983 Other specified disorders of Eustachian tube, bilateral: Secondary | ICD-10-CM | POA: Diagnosis not present

## 2021-06-13 DIAGNOSIS — H6523 Chronic serous otitis media, bilateral: Secondary | ICD-10-CM | POA: Diagnosis not present

## 2021-06-13 DIAGNOSIS — H906 Mixed conductive and sensorineural hearing loss, bilateral: Secondary | ICD-10-CM | POA: Diagnosis not present

## 2021-06-18 ENCOUNTER — Other Ambulatory Visit: Payer: Self-pay | Admitting: Family Medicine

## 2021-06-19 ENCOUNTER — Other Ambulatory Visit: Payer: Self-pay | Admitting: Family Medicine

## 2021-06-19 DIAGNOSIS — M109 Gout, unspecified: Secondary | ICD-10-CM

## 2021-06-22 ENCOUNTER — Encounter: Payer: Self-pay | Admitting: Family Medicine

## 2021-06-22 DIAGNOSIS — R922 Inconclusive mammogram: Secondary | ICD-10-CM | POA: Diagnosis not present

## 2021-06-22 DIAGNOSIS — R928 Other abnormal and inconclusive findings on diagnostic imaging of breast: Secondary | ICD-10-CM | POA: Diagnosis not present

## 2021-07-02 ENCOUNTER — Encounter: Payer: Self-pay | Admitting: Family Medicine

## 2021-07-02 DIAGNOSIS — Z780 Asymptomatic menopausal state: Secondary | ICD-10-CM | POA: Diagnosis not present

## 2021-07-03 ENCOUNTER — Encounter: Payer: Self-pay | Admitting: *Deleted

## 2021-07-20 DIAGNOSIS — H6993 Unspecified Eustachian tube disorder, bilateral: Secondary | ICD-10-CM | POA: Diagnosis not present

## 2021-07-20 DIAGNOSIS — H903 Sensorineural hearing loss, bilateral: Secondary | ICD-10-CM | POA: Diagnosis not present

## 2021-08-13 ENCOUNTER — Other Ambulatory Visit: Payer: Self-pay

## 2021-08-13 NOTE — Patient Outreach (Signed)
[facility]) Care Management  08/13/2021  Carla Wells December 09, 1940 [id number]   Telephone call to patient for disease management follow up. No answer. Unable to leave a message.   Plan: RN CM will attempt again in the month of December.    Jone Baseman, RN, MSN [place] Management Care Management Coordinator Direct Line [phone] Cell [phone] Toll Free: [phone]  Fax: [phone]

## 2021-08-14 ENCOUNTER — Ambulatory Visit: Payer: Self-pay

## 2021-08-28 ENCOUNTER — Other Ambulatory Visit: Payer: Self-pay | Admitting: Family Medicine

## 2021-10-04 DIAGNOSIS — H903 Sensorineural hearing loss, bilateral: Secondary | ICD-10-CM | POA: Diagnosis not present

## 2021-10-06 IMAGING — CR DG CHEST 2V
2 series · 2 of 2 positions shown · non-contrast
Comparison: December 15, 2018.

CLINICAL DATA: Shortness of breath.

EXAM:
CHEST - 2 VIEW

[chest pa]
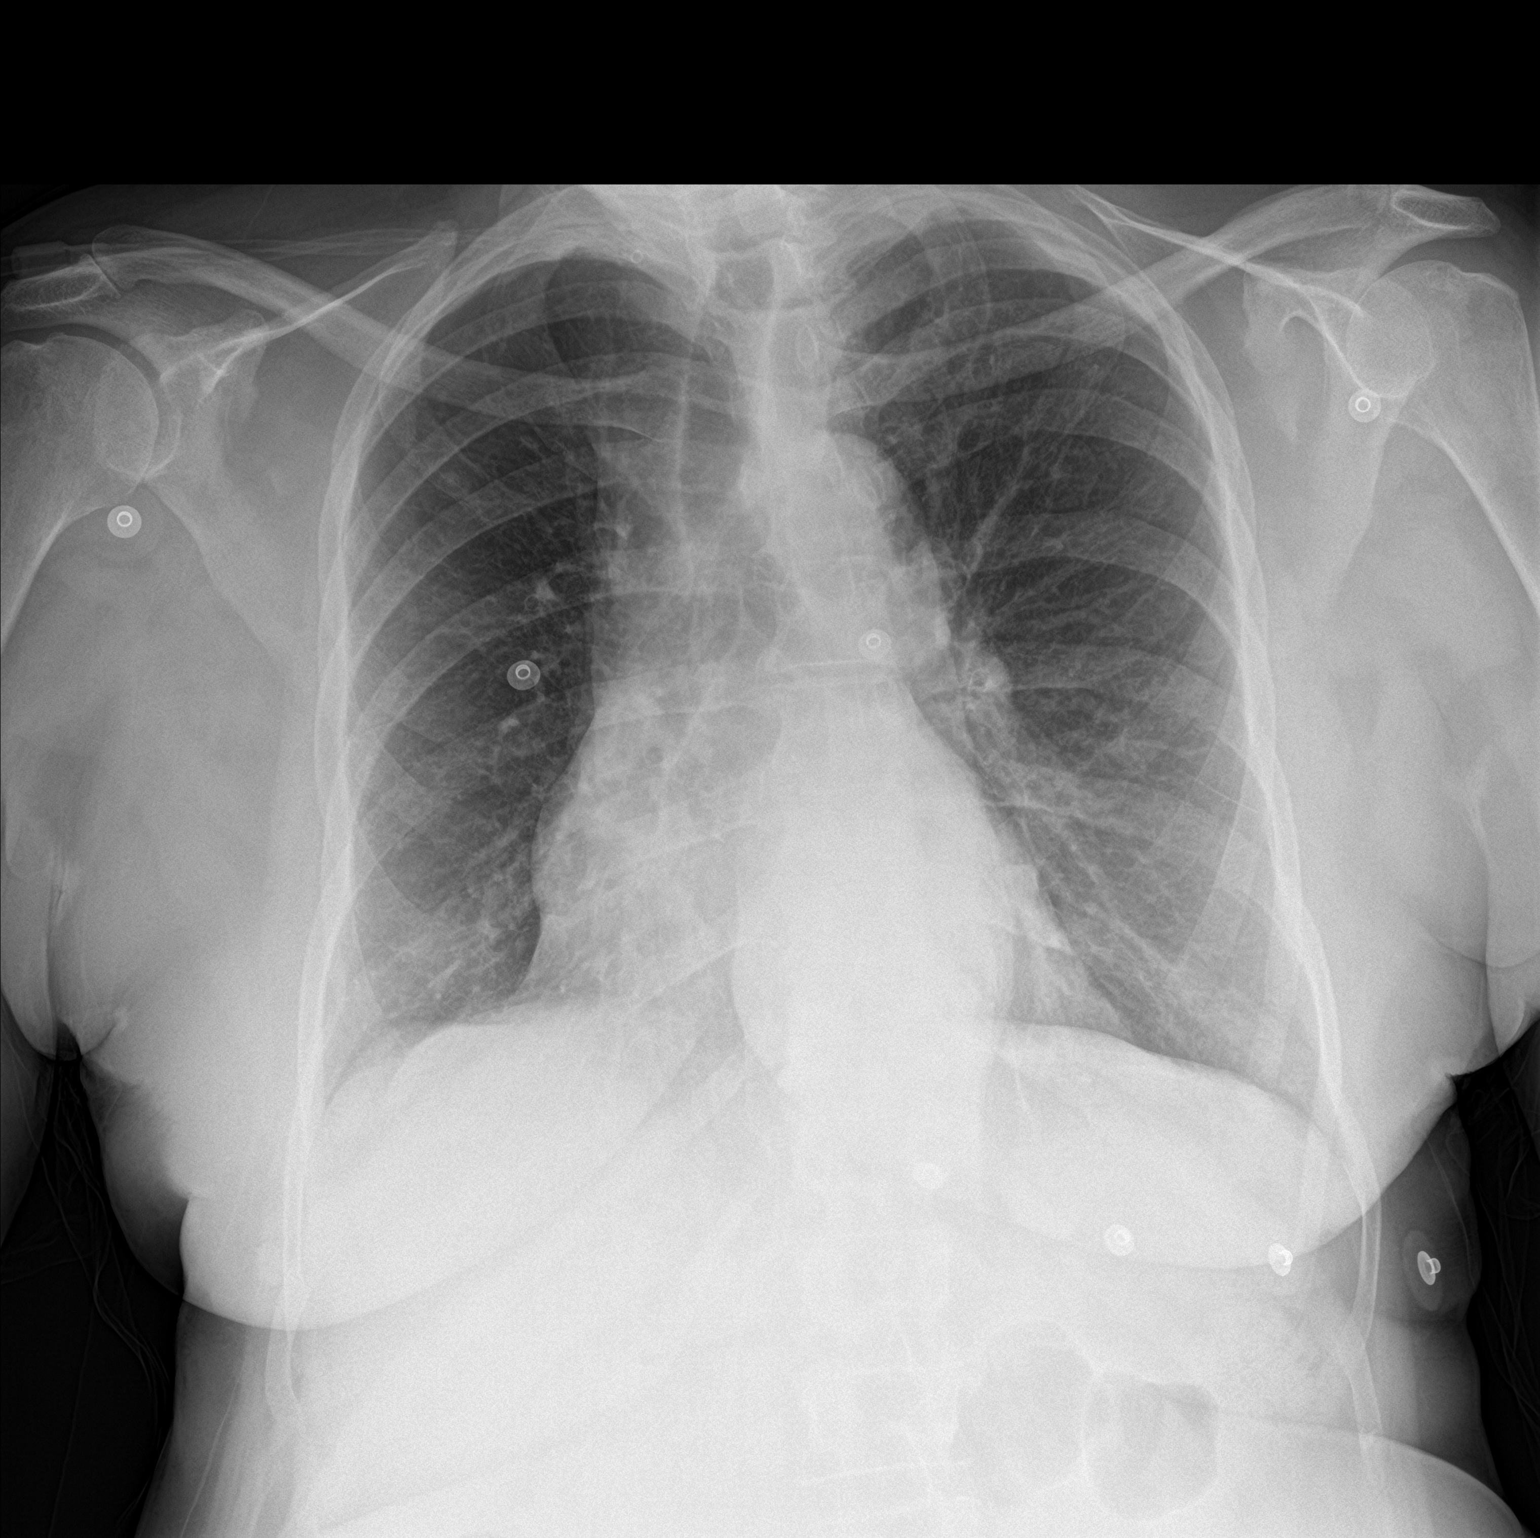

[chest lat]
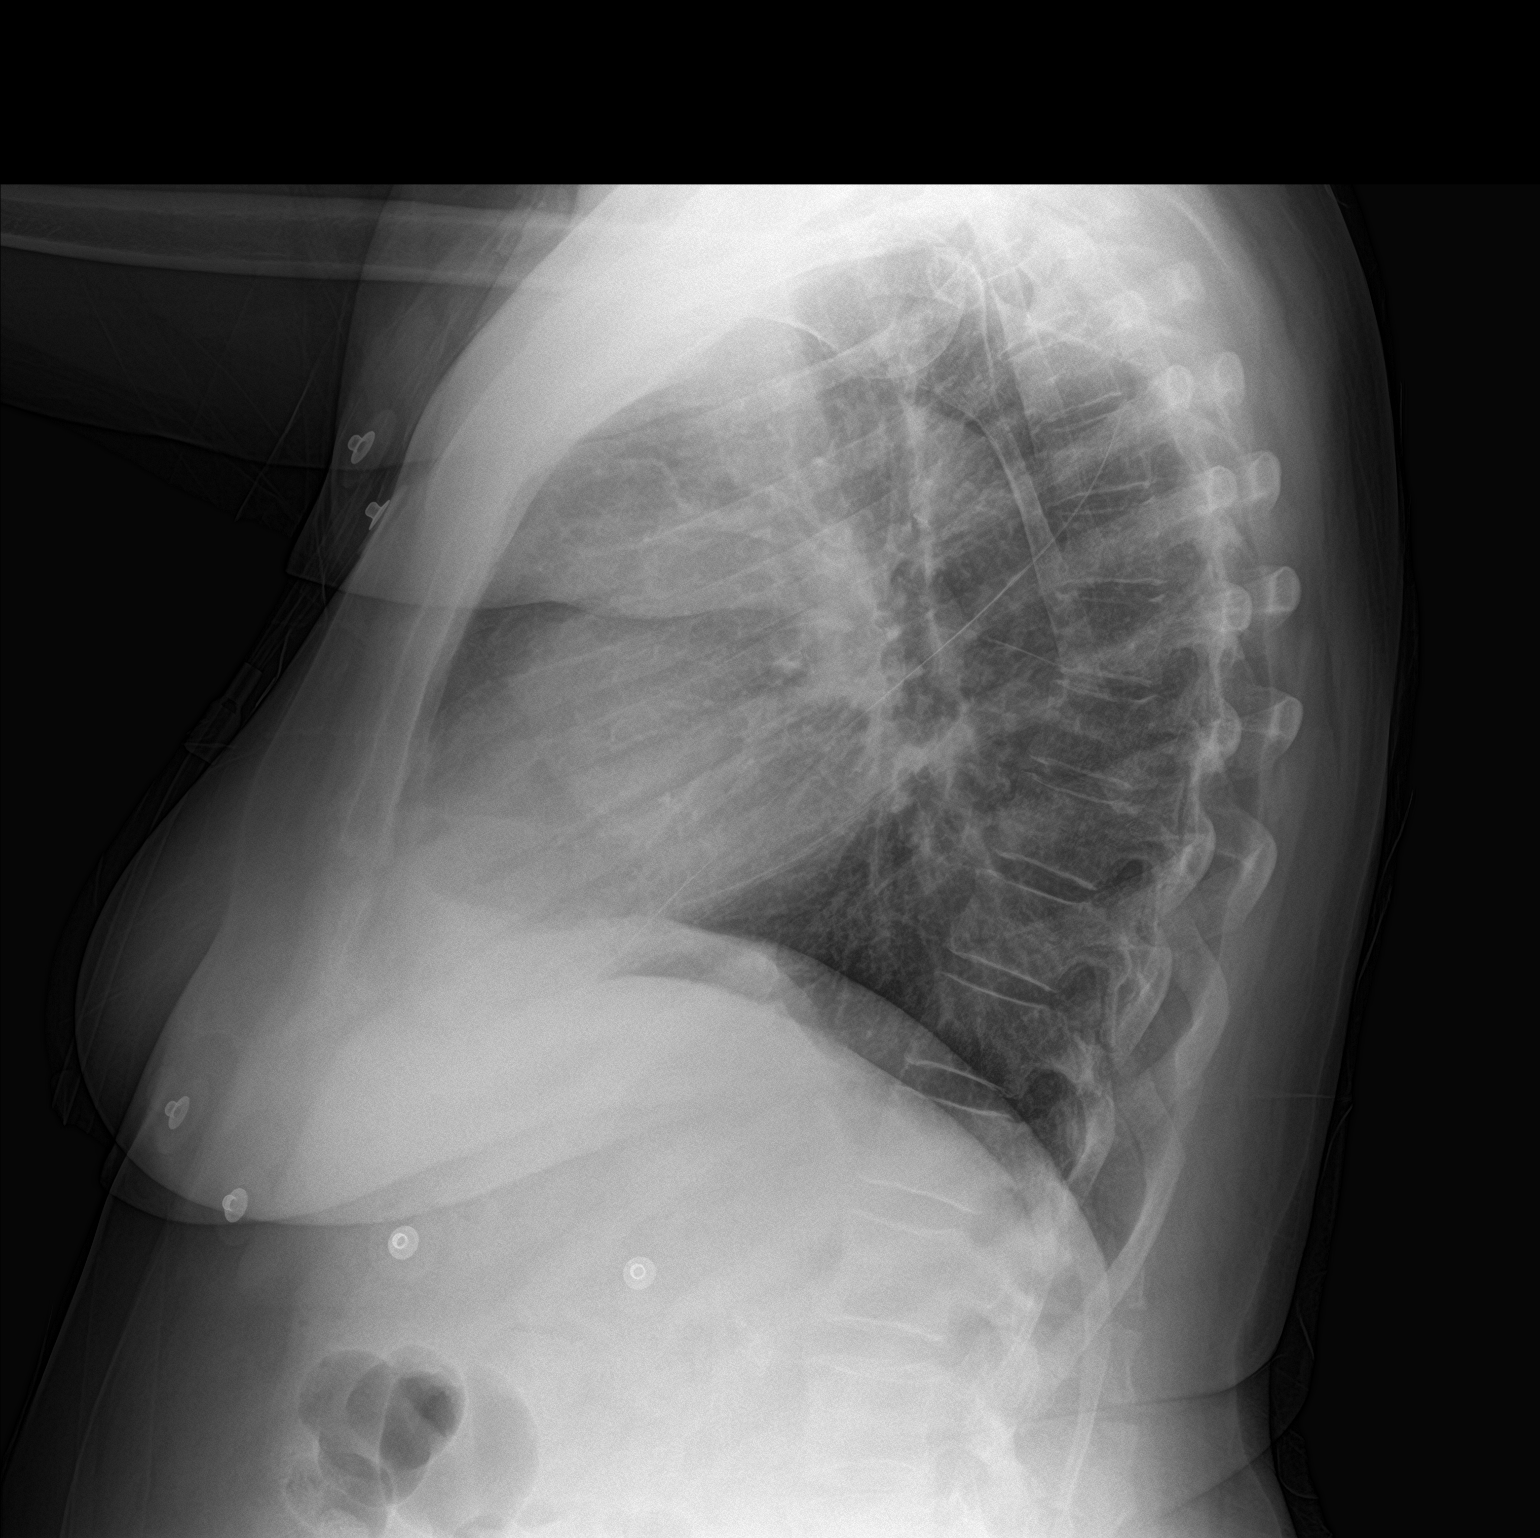

[2 of 2 positions shown; findings below may reference images not displayed]

FINDINGS: The heart size and mediastinal contours are within normal limits.
Both lungs are clear. No pneumothorax or pleural effusion is noted.
The visualized skeletal structures are unremarkable.
IMPRESSION: No active cardiopulmonary disease.

## 2021-11-12 ENCOUNTER — Other Ambulatory Visit: Payer: Self-pay

## 2021-11-12 NOTE — Patient Outreach (Signed)
[facility]) Care Management  [place]  11/12/2021   Carla Wells July 03, 1941 [id number]  Subjective: Patient doing well.  Diabetes management continues.  No concerns.    Objective:   Encounter Medications:  Outpatient Encounter Medications as of 11/12/2021  Medication Sig   allopurinol (ZYLOPRIM) 100 MG tablet TAKE 1 TABLET BY MOUTH TWICE A DAY   amLODipine (NORVASC) 10 MG tablet Take 1 tablet (10 mg total) by mouth daily.   blood glucose meter kit and supplies KIT Dispense based on patient and insurance preference. Use up to four times daily as directed. (FOR ICD-9 250.00, 250.01).   Calcium Citrate-Vitamin D (CALCIUM + D PO) Take 1 tablet by mouth daily.   Cholecalciferol (VITAMIN D3 PO) Take 1 tablet by mouth daily.   Colchicine (MITIGARE) 0.6 MG CAPS Take one tablet by mouth daily   Insulin Pen Needle 32G X 4 MM MISC 1 Container by Does not apply route in the morning and at bedtime.   latanoprost (XALATAN) 0.005 % ophthalmic solution Place 1 drop into both eyes at bedtime.    losartan-hydrochlorothiazide (HYZAAR) 50-12.5 MG tablet TAKE 1 TABLET EVERY DAY   metFORMIN (GLUCOPHAGE) 500 MG tablet Take 2 tablets (1,000 mg total) by mouth 2 (two) times daily with a meal.   omeprazole (PRILOSEC) 40 MG capsule TAKE 1 CAPSULE EVERY DAY   simvastatin (ZOCOR) 10 MG tablet TAKE 1 TABLET AT BEDTIME   No facility-administered encounter medications on file as of 11/12/2021.    Functional Status:  In your present state of health, do you have any difficulty performing the following activities: 11/12/2021 05/18/2021  Hearing? N Y  Vision? N N  Difficulty concentrating or making decisions? N N  Walking or climbing stairs? N N  Dressing or bathing? N N  Doing errands, shopping? N N  Preparing Food and eating ? - N  Using the Toilet? N N  In the past six months, have you accidently leaked urine? N N  Do you have problems with loss of bowel control? N N   Managing your Medications? N N  Managing your Finances? N N  Housekeeping or managing your Housekeeping? N N  Some recent data might be hidden    Fall/Depression Screening: Fall Risk  11/12/2021 05/18/2021 05/15/2021  Falls in the past year? 0 0 0  Number falls in past yr: - 0 -  Injury with Fall? - 0 -  Risk for fall due to : - No Fall Risks -  Follow up - Falls evaluation completed;Falls prevention discussed -   PHQ 2/9 Scores 11/12/2021 05/18/2021 05/15/2021 03/22/2021 02/13/2021 12/19/2020 11/08/2020  PHQ - 2 Score 0 0 0 0 0 0 0  PHQ- 9 Score - - - - - - -    Assessment:   Care Plan Care Plan : Wellness (Adult)  Updates made by Jon Billings, RN since 11/12/2021 12:00 AM  Completed 11/12/2021   Problem: Health Literacy (Wellness) Resolved 11/12/2021     Care Plan : Diabetes Type 2 (Adult)  Updates made by Jon Billings, RN since 11/12/2021 12:00 AM  Completed 11/12/2021   Problem: Disease Progression (Diabetes, Type 2) Resolved 11/12/2021  Priority: High  Note:   Patient with newly diagnosed diabetes.      Long-Range Goal: Disease Progression Prevented or Minimized as evidenced by A1c less than 7.0 Completed 11/12/2021  Start Date: 09/07/2020  Expected End Date: 12/01/2021  Recent Progress: On track  Priority: High  Note:   Evidence-based  guidance:  Encourage lifestyle changes, such as increased intake of plant-based foods, stress reduction, consistent physical activity, and smoking cessation to prevent  long-term complications and chronic diseases.  Individualize activity and exercise recommendations while considering  potential limitations, such as neuropathy, retinopathy, or the ability to prevent  hyperglycemia or hypoglycemia. Ensure completion of annual comprehensive foot exam and dilated eye exam.  Notes: 02/13/21 A1c 6.4.  32/25/67 REsolving duplicate goal    Task: Monitor and Manage Follow-up for Comorbidities Completed 11/12/2021  Due Date: 12/01/2021   Priority: Routine  Responsible User: Jon Billings, RN  Note:   Care Management Activities:    - healthy lifestyle promoted - response to pharmacologic therapy monitored    Notes: Encouraged logging blood sugars and exercise.  02/13/21 Patient exercising more now.   05/15/21 Patient reports that her sugar has trended higher due to gout medication but this morning was 103.      Care Plan : General Plan of Care (Adult)  Updates made by Jon Billings, RN since 11/12/2021 12:00 AM  Completed 11/12/2021   Care Plan : RN Care Manager Plan of Care  Updates made by Jon Billings, RN since 11/12/2021 12:00 AM     Problem: Chronic disease management and care coordination for diabetes.   Priority: High     Long-Range Goal: Develop plan of care for the management of diabetes   Start Date: 11/12/2021  Expected End Date: 12/01/2022  Priority: High  Note:   Current Barriers:  Knowledge Deficits related to plan of care for management of DMII  Chronic Disease Management support and education needs related to DMII   RNCM Clinical Goal(s):  Patient will verbalize basic understanding of  DMII disease process and self health management plan as evidenced by A1c less than 7.0 continue to work with RN Care Manager to address care management and care coordination needs related to  DMII as evidenced by adherence to CM Team Scheduled appointments through collaboration with RN Care manager, provider, and care team.   Interventions: Education and support related to diabetes management Inter-disciplinary care team collaboration (see longitudinal plan of care) Evaluation of current treatment plan related to  self management and patient's adherence to plan as established by provider   Diabetes Interventions:  (Status:  New goal.) Long Term Goal Assessed patient's understanding of A1c goal: <7% Provided education to patient about basic DM disease process Discussed plans with patient for ongoing care  management follow up and provided patient with direct contact information for care management team Review of patient status, including review of consultants reports, relevant laboratory and other test results, and medications completed Lab Results  Component Value Date   HGBA1C 6.4 (H) 02/06/2021  11/12/21 Patient blood sugars typically less than 150.  Discussed portion control and limiting sweets and carbohydrates.    Patient Goals/Self-Care Activities: Take all medications as prescribed Perform all self care activities independently  Perform IADL's (shopping, preparing meals, housekeeping, managing finances) independently Call provider office for new concerns or questions  schedule appointment with eye doctor check blood sugar at prescribed times: three times daily take the blood sugar log to all doctor visits manage portion size  Follow Up Plan:  Telephone follow up appointment with care management team member scheduled for:  March The patient has been provided with contact information for the care management team and has been advised to call with any health related questions or concerns.        Goals Addressed  This Visit's Progress    COMPLETED: THN-Monitor and Manage My Blood Sugar   On track    Barriers: Health Behaviors Timeframe:  Long-Range Goal Priority:  High Start Date:  11/08/20                           Expected End Date:  12/01/21      Follow up: 08/31/21  - check blood sugar before and after exercise - take the blood sugar log to all doctor visits    Why is this important?   Checking your blood sugar at home helps to keep it from getting very high or very low.  Writing the results in a diary or log helps the doctor know how to care for you.  Your blood sugar log should have the time, date and the results.  Also, write down the amount of insulin or other medicine that you take.  Other information, like what you ate, exercise done and how you  were feeling, will also be helpful.     Notes: 02/14/11 Keep up the great work!! 6/14/ A1c last 6.4.   Diabetes Management Discussed: Medication adherence Reviewed importance of limiting carbs such as rice, potatoes, breads, and pastas. Also discussed limiting sweets and sugary drinks.  Discussed importance of portion control.  Also discussed importance of annual exams such as mammogram and eye exam.   41/36/43 Resolving duplicate goal        Plan:  Follow-up: Patient agrees to Care Plan and Follow-up. Follow-up in 3 month(s)  Jone Baseman, RN, MSN [place] [facility] Care Management Coordinator Direct Line [phone] Cell [phone] Toll Free: [phone]  Fax: [phone]

## 2021-11-12 NOTE — Patient Instructions (Signed)
Patient Goals/Self-Care Activities: Take all medications as prescribed Perform all self care activities independently  Perform IADL's (shopping, preparing meals, housekeeping, managing finances) independently Call provider office for new concerns or questions  schedule appointment with eye doctor check blood sugar at prescribed times: three times daily take the blood sugar log to all doctor visits manage portion size

## 2021-12-02 ENCOUNTER — Other Ambulatory Visit: Payer: Self-pay | Admitting: Family Medicine

## 2021-12-02 DIAGNOSIS — M109 Gout, unspecified: Secondary | ICD-10-CM

## 2021-12-05 ENCOUNTER — Telehealth: Payer: Self-pay

## 2021-12-05 NOTE — Telephone Encounter (Signed)
Pt called in wanting to ask if it was ok that she eat bologna being that she has diabetes. Please advise  Cb#: [phone]

## 2021-12-06 NOTE — Telephone Encounter (Signed)
Spoke with patient regarding her question.

## 2021-12-10 ENCOUNTER — Encounter: Payer: Medicare HMO | Admitting: Family Medicine

## 2021-12-17 ENCOUNTER — Other Ambulatory Visit: Payer: Self-pay

## 2021-12-17 ENCOUNTER — Ambulatory Visit (INDEPENDENT_AMBULATORY_CARE_PROVIDER_SITE_OTHER): Payer: Medicare HMO | Admitting: Family Medicine

## 2021-12-17 ENCOUNTER — Encounter: Payer: Self-pay | Admitting: Family Medicine

## 2021-12-17 VITALS — BP 128/62 | HR 100 | Temp 97.0°F | Resp 18 | Ht 64.0 in | Wt 171.0 lb

## 2021-12-17 DIAGNOSIS — I152 Hypertension secondary to endocrine disorders: Secondary | ICD-10-CM

## 2021-12-17 DIAGNOSIS — E1169 Type 2 diabetes mellitus with other specified complication: Secondary | ICD-10-CM

## 2021-12-17 DIAGNOSIS — H9113 Presbycusis, bilateral: Secondary | ICD-10-CM

## 2021-12-17 DIAGNOSIS — Z0001 Encounter for general adult medical examination with abnormal findings: Secondary | ICD-10-CM | POA: Diagnosis not present

## 2021-12-17 DIAGNOSIS — E785 Hyperlipidemia, unspecified: Secondary | ICD-10-CM

## 2021-12-17 DIAGNOSIS — E118 Type 2 diabetes mellitus with unspecified complications: Secondary | ICD-10-CM

## 2021-12-17 DIAGNOSIS — N1831 Chronic kidney disease, stage 3a: Secondary | ICD-10-CM | POA: Diagnosis not present

## 2021-12-17 DIAGNOSIS — E1159 Type 2 diabetes mellitus with other circulatory complications: Secondary | ICD-10-CM

## 2021-12-17 DIAGNOSIS — Z0000 Encounter for general adult medical examination without abnormal findings: Secondary | ICD-10-CM

## 2021-12-17 NOTE — Progress Notes (Signed)
Subjective:    Patient ID: Carla Wells, female    DOB: 1941-09-20, 81 y.o.   MRN: [id number]  HPI Patient is a very pleasant 81 year old African-American female who presents today for complete physical exam.  Past medical history is significant for hypertension, hyperlipidemia, type 2 diabetes mellitus, and a history of bilateral pulmonary emboli in 2021.  Overall she is doing well.  She denies any complaints.  Her blood pressure today is outstanding at 128/62.  She denies any polyuria polydipsia or blurry vision.  She is due for diabetic eye exam however she is scheduling this herself.  Her flu shot is up-to-date.  The remainder of her immunizations are listed below: Immunization History  Administered Date(s) Administered   Fluad Quad(high Dose 65+) 08/27/2019, 09/11/2020   Influenza Split 11/06/2012   Influenza, High Dose Seasonal PF 09/09/2018   Influenza,inj,Quad PF,6+ Mos 09/16/2013, 09/06/2014, 09/07/2015, 09/26/2016, 09/11/2017   Influenza-Unspecified 08/31/2021   PFIZER(Purple Top)SARS-COV-2 Vaccination 01/11/2020, 02/05/2020, 03/13/2021   Pfizer Covid-19 Vaccine Bivalent Booster 80yr & up 08/31/2021   Pneumococcal Conjugate-13 11/04/2013   Pneumococcal Polysaccharide-23 10/03/2010   Patient's pneumonia shots are up-to-date.  She is due for shingles which I recommended.  Because of her age I would not recommend a colonoscopy or Pap smear.  Her mammogram was performed in July of last year and is normal.  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  This is soft and subcutaneous and freely mobile.  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  Is not changing.  It is not painful.  It is soft and squishy and does not feel hard to the touch.  She denies any falls or depression or memory loss.  She had a bone density test in August of last year which showed mild osteopenia and she is taking calcium and vitamin D Past Medical History:  Diagnosis  Date   Abnormal pap    Cataract    Colon polyps    Diabetes mellitus without complication ([place]    Hyperlipidemia    Hypertension    Murmur    History reviewed. No pertinent surgical history. Current Outpatient Medications on File Prior to Visit  Medication Sig Dispense Refill   allopurinol (ZYLOPRIM) 100 MG tablet TAKE 1 TABLET BY MOUTH EVERY DAY 90 tablet 2   amLODipine (NORVASC) 10 MG tablet Take 1 tablet (10 mg total) by mouth daily. 90 tablet 1   blood glucose meter kit and supplies KIT Dispense based on patient and insurance preference. Use up to four times daily as directed. (FOR ICD-9 250.00, 250.01). 1 each 0   Calcium Citrate-Vitamin D (CALCIUM + D PO) Take 1 tablet by mouth daily.     Cholecalciferol (VITAMIN D3 PO) Take 1 tablet by mouth daily.     Colchicine (MITIGARE) 0.6 MG CAPS Take one tablet by mouth daily 90 capsule 0   fluticasone (FLONASE) 50 MCG/ACT nasal spray Place 2 sprays into both nostrils daily.     Insulin Pen Needle 32G X 4 MM MISC 1 Container by Does not apply route in the morning and at bedtime. 100 each 0   latanoprost (XALATAN) 0.005 % ophthalmic solution Place 1 drop into both eyes at bedtime.      losartan-hydrochlorothiazide (HYZAAR) 50-12.5 MG tablet TAKE 1 TABLET EVERY DAY 90 tablet 3   metFORMIN (GLUCOPHAGE) 500 MG tablet Take 2 tablets (1,000 mg total) by mouth 2 (two) times daily with a meal.  360 tablet 3   omeprazole (PRILOSEC) 40 MG capsule TAKE 1 CAPSULE EVERY DAY 90 capsule 3   simvastatin (ZOCOR) 10 MG tablet TAKE 1 TABLET AT BEDTIME 90 tablet 1   No current facility-administered medications on file prior to visit.   Allergies  Allergen Reactions   Penicillins Swelling    Facial swelling   Social History   Socioeconomic History   Marital status: Single    Spouse name: Not on file   Number of children: Not on file   Years of education: Not on file   Highest education level: Not on file  Occupational History   Not on file   Tobacco Use   Smoking status: Never   Smokeless tobacco: Former    Types: Snuff  Substance and Sexual Activity   Alcohol use: No   Drug use: No   Sexual activity: Not on file  Other Topics Concern   Not on file  Social History Narrative   Not on file   Social Determinants of Health   Financial Resource Strain: Low Risk    Difficulty of Paying Living Expenses: Not hard at all  Food Insecurity: No Food Insecurity   Worried About Charity fundraiser in the Last Year: Never true   [place] in the Last Year: Never true  Transportation Needs: No Transportation Needs   Lack of Transportation (Medical): No   Lack of Transportation (Non-Medical): No  Physical Activity: Sufficiently Active   Days of Exercise per Week: 7 days   Minutes of Exercise per Session: 30 min  Stress: No Stress Concern Present   Feeling of Stress : Not at all  Social Connections: Moderately Isolated   Frequency of Communication with Friends and Family: Three times a week   Frequency of Social Gatherings with Friends and Family: Twice a week   Attends Religious Services: More than 4 times per year   Active Member of [employer] or Organizations: No   Attends Archivist Meetings: Never   Marital Status: Divorced  Human resources officer Violence: Not At Risk   Fear of Current or Ex-Partner: No   Emotionally Abused: No   Physically Abused: No   Sexually Abused: No     Review of Systems  All other systems reviewed and are negative.     Objective:   Physical Exam Vitals reviewed.  Constitutional:      General: She is not in acute distress.    Appearance: Normal appearance. She is normal weight. She is not ill-appearing or toxic-appearing.  HENT:     Head: Normocephalic and atraumatic.     Right Ear: Tympanic membrane and ear canal normal.     Left Ear: Tympanic membrane and ear canal normal.     Nose: Nose normal. No congestion or rhinorrhea.     Mouth/Throat:     Mouth: Mucous membranes are  moist.     Pharynx: No oropharyngeal exudate or posterior oropharyngeal erythema.  Eyes:     Extraocular Movements: Extraocular movements intact.     Conjunctiva/sclera: Conjunctivae normal.     Pupils: Pupils are equal, round, and reactive to light.  Neck:     Vascular: No carotid bruit.  Cardiovascular:     Rate and Rhythm: Normal rate and regular rhythm.     Heart sounds: Normal heart sounds. No murmur heard.   No gallop.  Pulmonary:     Effort: Pulmonary effort is normal. No respiratory distress.     Breath sounds: Normal  breath sounds. No stridor. No wheezing, rhonchi or rales.  Chest:     Chest wall: Mass present.    Abdominal:     General: Abdomen is flat. Bowel sounds are normal. There is no distension.     Palpations: Abdomen is soft.     Tenderness: There is no abdominal tenderness. There is no guarding or rebound.  Musculoskeletal:     Cervical back: Normal range of motion and neck supple. No tenderness.     Right lower leg: No edema.     Left lower leg: No edema.  Lymphadenopathy:     Cervical: No cervical adenopathy.  Skin:    General: Skin is warm and dry.     Coloration: Skin is not jaundiced or pale.     Findings: No bruising, erythema, lesion or rash.  Neurological:     General: No focal deficit present.     Mental Status: She is alert and oriented to person, place, and time. Mental status is at baseline.     Cranial Nerves: No cranial nerve deficit.     Sensory: No sensory deficit.     Motor: No weakness.     Coordination: Coordination normal.     Gait: Gait normal.     Deep Tendon Reflexes: Reflexes normal.  Psychiatric:        Mood and Affect: Mood normal.        Behavior: Behavior normal.        Thought Content: Thought content normal.        Judgment: Judgment normal.          Assessment & Plan:  Controlled type 2 diabetes mellitus with complication, without long-term current use of insulin ([place]) - Plan: CBC with Differential/Platelet,  COMPLETE METABOLIC PANEL WITH GFR, Lipid panel, Hemoglobin A1c  Encounter for Medicare annual wellness exam  Presbycusis of both ears  Stage 3a chronic kidney disease ([place])  Hyperlipidemia associated with type 2 diabetes mellitus ([place])  Hypertension associated with diabetes ([place]) Patient's blood pressure today is outstanding.  I will check an A1c.  I like to see her A1c below 7.  I will also check a fasting lipid panel.  I like to see her LDL cholesterol below 100.  Monitor CMP to monitor her renal function and also CBC to monitor for any anemia.  Flu shot is up-to-date.  Pneumonia vaccines are up-to-date.  Did recommend shingles vaccine.  Mammogram is due in July.  Recommended diabetic eye exam.  Bone density test is not due again until 2025.  She is on calcium and vitamin D.

## 2021-12-18 ENCOUNTER — Other Ambulatory Visit: Payer: Medicare HMO

## 2021-12-18 DIAGNOSIS — E118 Type 2 diabetes mellitus with unspecified complications: Secondary | ICD-10-CM | POA: Diagnosis not present

## 2021-12-18 LAB — LIPID PANEL
Cholesterol: 163 mg/dL (ref <200)
HDL: 49 mg/dL — ABNORMAL LOW (ref >=50)
LDL Cholesterol (Calc): 93 mg/dL (calc)
Non-HDL Cholesterol (Calc): 114 mg/dL (calc) (ref <130)
Total CHOL/HDL Ratio: 3.3 (calc) (ref <5.0)
Triglycerides: 111 mg/dL (ref <150)

## 2021-12-18 LAB — CBC WITH DIFFERENTIAL/PLATELET
Absolute Monocytes: 628 cells/uL (ref 200–950)
Basophils Absolute: 49 cells/uL (ref 0–200)
Basophils Relative: 0.8 %
Eosinophils Absolute: 92 cells/uL (ref 15–500)
Eosinophils Relative: 1.5 %
HCT: 42.5 % (ref 35.0–45.0)
Hemoglobin: 13.7 g/dL (ref 11.7–15.5)
Lymphs Abs: 2019 cells/uL (ref 850–3900)
MCH: 27.5 pg (ref 27.0–33.0)
MCHC: 32.2 g/dL (ref 32.0–36.0)
MCV: 85.2 fL (ref 80.0–100.0)
MPV: 12.1 fL (ref 7.5–12.5)
Monocytes Relative: 10.3 %
Neutro Abs: 3312 cells/uL (ref 1500–7800)
Neutrophils Relative %: 54.3 %
Platelets: 347 10*3/uL (ref 140–400)
RBC: 4.99 10*6/uL (ref 3.80–5.10)
RDW: 13.8 % (ref 11.0–15.0)
Total Lymphocyte: 33.1 %
WBC: 6.1 10*3/uL (ref 3.8–10.8)

## 2021-12-18 LAB — COMPLETE METABOLIC PANEL WITH GFR
AG Ratio: 1.3 (calc) (ref 1.0–2.5)
ALT: 11 U/L (ref 6–29)
AST: 15 U/L (ref 10–35)
Albumin: 4.2 g/dL (ref 3.6–5.1)
Alkaline phosphatase (APISO): 97 U/L (ref 37–153)
BUN/Creatinine Ratio: 21 (calc) (ref 6–22)
BUN: 25 mg/dL (ref 7–25)
CO2: 30 mmol/L (ref 20–32)
Calcium: 10.5 mg/dL — ABNORMAL HIGH (ref 8.6–10.4)
Chloride: 101 mmol/L (ref 98–110)
Creat: 1.18 mg/dL — ABNORMAL HIGH (ref 0.60–0.95)
Globulin: 3.3 g/dL (calc) (ref 1.9–3.7)
Glucose, Bld: 98 mg/dL (ref 65–99)
Potassium: 4.8 mmol/L (ref 3.5–5.3)
Sodium: 138 mmol/L (ref 135–146)
Total Bilirubin: 0.4 mg/dL (ref 0.2–1.2)
Total Protein: 7.5 g/dL (ref 6.1–8.1)
eGFR: 47 mL/min/{1.73_m2} — ABNORMAL LOW (ref >=60)

## 2021-12-18 LAB — HEMOGLOBIN A1C
Hgb A1c MFr Bld: 6.2 % of total Hgb — ABNORMAL HIGH (ref <5.7)
Mean Plasma Glucose: 131 mg/dL
eAG (mmol/L): 7.3 mmol/L

## 2022-01-03 ENCOUNTER — Other Ambulatory Visit: Payer: Self-pay | Admitting: Family Medicine

## 2022-01-03 DIAGNOSIS — M109 Gout, unspecified: Secondary | ICD-10-CM

## 2022-01-30 ENCOUNTER — Other Ambulatory Visit: Payer: Self-pay

## 2022-01-30 NOTE — Patient Outreach (Signed)
[facility]) Care Management ? ?01/30/2022 ? ?Carla Wells ?[birth year] ?[id number] ? ? ?Telephone call to patient for disease management follow up.  Patient reports doing fine.  A1c last report was 6.2.  Encouraged patient to keep up the great work.  Questions answers relating to diabetes diet.  No concerns.   ? ?Care Plan : RN Care Manager Plan of Care  ?Updates made by Jon Billings, RN since 01/30/2022 12:00 AM  ?  ? ?Problem: Chronic disease management and care coordination for diabetes.   ?Priority: High  ?  ? ?Long-Range Goal: Develop plan of care for the management of diabetes   ?Start Date: 11/12/2021  ?Expected End Date: 12/01/2022  ?Priority: High  ?Note:   ?Current Barriers:  ?Chronic Disease Management support and education needs related to DMII  ? ?RNCM Clinical Goal(s):  ?Patient will verbalize basic understanding of  DMII disease process and self health management plan as evidenced by A1c less than 7.0 ?continue to work with RN Care Manager to address care management and care coordination needs related to  DMII as evidenced by adherence to CM Team Scheduled appointments through collaboration with RN Care manager, provider, and care team.  ? ?Interventions: ?Education and support related to diabetes management ?Inter-disciplinary care team collaboration (see longitudinal plan of care) ?Evaluation of current treatment plan related to  self management and patient's adherence to plan as established by provider ? ? ?Diabetes Interventions:  (Status:  New goal.) Long Term Goal ?Assessed patient's understanding of A1c goal: <7% ?Provided education to patient about basic DM disease process ?Discussed plans with patient for ongoing care management follow up and provided patient with direct contact information for care management team ?Review of patient status, including review of consultants reports, relevant laboratory and other test results, and medications completed ?Lab Results  ?Component  Value Date  ? HGBA1C 6.4 (H) 02/06/2021  ?01/30/22 Patient doing well.  Last A1c 6.2.  Encouraged continued diabetes management.   ? ?Patient Goals/Self-Care Activities: Diabetes ?Take all medications as prescribed ?Perform all self care activities independently  ?Perform IADL's (shopping, preparing meals, housekeeping, managing finances) independently ?Call provider office for new concerns or questions  ?check blood sugar at prescribed times: twice daily ?check feet daily for cuts, sores or redness ?enter blood sugar readings and medication or insulin into daily log ?take the blood sugar log to all doctor visits ?manage portion size ? ?Follow Up Plan:  Telephone follow up appointment with care management team member scheduled for:  June ?The patient has been provided with contact information for the care management team and has been advised to call with any health related questions or concerns.  ? ?  ? ?Plan Follow-up: Patient agrees to Care Plan and Follow-up. ?Follow-up in 3 months. ? ?Carla Baseman, RN, MSN ?[facility] Care Management ?Care Management Coordinator ?Direct Line [phone] ?Toll Free: [phone]  ?Fax: [phone] ? ?

## 2022-01-30 NOTE — Patient Instructions (Signed)
Patient Goals/Self-Care Activities: Diabetes ?Take all medications as prescribed ?Perform all self care activities independently  ?Perform IADL's (shopping, preparing meals, housekeeping, managing finances) independently ?Call provider office for new concerns or questions  ?check blood sugar at prescribed times: twice daily ?check feet daily for cuts, sores or redness ?enter blood sugar readings and medication or insulin into daily log ?take the blood sugar log to all doctor visits ?manage portion size ?

## 2022-02-04 ENCOUNTER — Ambulatory Visit: Payer: Self-pay

## 2022-02-08 ENCOUNTER — Other Ambulatory Visit: Payer: Self-pay | Admitting: Family Medicine

## 2022-02-08 MED ORDER — AMLODIPINE BESYLATE 10 MG PO TABS: 10.0000 mg | ORAL_TABLET | Freq: Every day | ORAL | 1 refills | Status: DC

## 2022-02-08 NOTE — Telephone Encounter (Signed)
Amlodipine '5mg'$  refused. Per chart patient should be on '10mg'$  dose. Refill sent for this.  ?

## 2022-02-11 ENCOUNTER — Other Ambulatory Visit: Payer: Self-pay | Admitting: Family Medicine

## 2022-02-26 ENCOUNTER — Other Ambulatory Visit: Payer: Self-pay | Admitting: Family Medicine

## 2022-04-01 ENCOUNTER — Telehealth: Payer: Self-pay

## 2022-04-01 DIAGNOSIS — M109 Gout, unspecified: Secondary | ICD-10-CM

## 2022-04-01 MED ORDER — ALLOPURINOL 100 MG PO TABS: 100.0000 mg | ORAL_TABLET | Freq: Every day | ORAL | 2 refills | Status: DC

## 2022-04-01 NOTE — Telephone Encounter (Signed)
Pharmacy faxed a refill request for ?allopurinol (ZYLOPRIM) 100 MG tablet [[id number]]  ?  Order Details ?Dose, Route, Frequency: As Directed  ?Dispense Quantity: 90 tablet Refills: 2   ?Note to Pharmacy: Needs appointment  ?     ?Sig: TAKE 1 TABLET BY MOUTH EVERY DAY  ?     ?Start Date: 12/04/21 End Date: --  ?Written Date: 12/04/21 Expiration Date: 12/04/22  ? ?

## 2022-04-18 ENCOUNTER — Other Ambulatory Visit: Payer: Self-pay

## 2022-04-18 NOTE — Telephone Encounter (Signed)
Pharmacy faxed a refill request for amLODipine (NORVASC) 10 MG tablet [[id number]]    Order Details Dose: 10 mg Route: Oral Frequency: Daily  Dispense Quantity: 90 tablet Refills: 1        Sig: Take 1 tablet (10 mg total) by mouth daily.       Start Date: 02/08/22 End Date: --  Written Date: 02/08/22 Expiration Date: 02/08/23  Original Order:  amLODipine (NORVASC) 10 MG tablet

## 2022-04-18 NOTE — Telephone Encounter (Signed)
Requested medication (s) are due for refill today: yes  Requested medication (s) are on the active medication list: yes  Last refill:  02/08/22 #90 1 refill  Future visit scheduled: no  Notes to clinic:  last OV [facility] annual wellness exam 12/17/21. Protocol failed to be see within 6 months. Do you want another OV for medication refills? Do you want to refill Rx?     Requested Prescriptions  Pending Prescriptions Disp Refills   amLODipine (NORVASC) 10 MG tablet 90 tablet 1    Sig: Take 1 tablet (10 mg total) by mouth daily.     Cardiovascular: Calcium Channel Blockers 2 Failed - 04/18/2022 12:42 PM      Failed - Valid encounter within last 6 months    Recent Outpatient Visits           4 months ago Controlled type 2 diabetes mellitus with complication, without long-term current use of insulin ([place])   [place] Pickard, Cammie Mcgee, MD   1 year ago Exacerbation of gout   [place] Susy Frizzle, MD   1 year ago Controlled type 2 diabetes mellitus with complication, without long-term current use of insulin ([place])   [place] Pickard, Cammie Mcgee, MD   1 year ago Acute gout of right foot, unspecified cause   [place] Pickard, Cammie Mcgee, MD   1 year ago Tinnitus of both ears   [place] Pickard, Cammie Mcgee, MD               Passed - Last BP in normal range    BP Readings from Last 1 Encounters:  12/17/21 128/62         Passed - Last Heart Rate in normal range    Pulse Readings from Last 1 Encounters:  12/17/21 100

## 2022-04-23 ENCOUNTER — Other Ambulatory Visit: Payer: Self-pay | Admitting: Family Medicine

## 2022-04-23 NOTE — Telephone Encounter (Signed)
Received eFax from pharmacy to request refill of  amLODipine (NORVASC) 10 MG tablet   Efax received from:.  [facility]  Phone: [phone] Fax: [phone]   Please advise pharmacist.

## 2022-04-24 MED ORDER — AMLODIPINE BESYLATE 10 MG PO TABS: 10.0000 mg | ORAL_TABLET | Freq: Every day | ORAL | 0 refills | Status: DC

## 2022-04-24 NOTE — Telephone Encounter (Signed)
Requested Prescriptions  Pending Prescriptions Disp Refills  . amLODipine (NORVASC) 10 MG tablet 90 tablet 1    Sig: Take 1 tablet (10 mg total) by mouth daily.     Cardiovascular: Calcium Channel Blockers 2 Failed - 04/23/2022 12:59 PM      Failed - Valid encounter within last 6 months    Recent Outpatient Visits          4 months ago Controlled type 2 diabetes mellitus with complication, without long-term current use of insulin ([place])   [place] Pickard, Cammie Mcgee, MD   1 year ago Exacerbation of gout   [place] Susy Frizzle, MD   1 year ago Controlled type 2 diabetes mellitus with complication, without long-term current use of insulin ([place])   [place] Pickard, Cammie Mcgee, MD   1 year ago Acute gout of right foot, unspecified cause   [place] Pickard, Cammie Mcgee, MD   1 year ago Tinnitus of both ears   [place] Pickard, Cammie Mcgee, MD             Passed - Last BP in normal range    BP Readings from Last 1 Encounters:  12/17/21 128/62         Passed - Last Heart Rate in normal range    Pulse Readings from Last 1 Encounters:  12/17/21 100

## 2022-04-24 NOTE — Telephone Encounter (Signed)
Called pt and LMOM - Pt has refills remaining at [facility]. Does pt want refill to go to mail order?

## 2022-05-02 ENCOUNTER — Other Ambulatory Visit: Payer: Self-pay | Admitting: Family Medicine

## 2022-05-02 ENCOUNTER — Other Ambulatory Visit: Payer: Self-pay

## 2022-05-02 NOTE — Patient Outreach (Signed)
[facility]) Care Management  05/02/2022  Chyan Carnero [birth year] [id number]   Telephone call to patient for disease management follow up.  No answer.  Unable to leave a message.  Plan: RN CM will attempt patient again in the month of September.  Jone Baseman, RN, MSN [facility] Care Management Care Management Coordinator Direct Line [phone] Toll Free: [phone]  Fax: [phone]

## 2022-05-03 NOTE — Telephone Encounter (Signed)
Will allow time for patient/DPR to call back for clarification and to request patient/ DPR contact [place] for medication refill if needed.

## 2022-05-03 NOTE — Telephone Encounter (Signed)
Called patient to clarify pharmacy medication to be sent to is [place]. Patient /DPR requested to contact [place] to verify this pharmacy has been used in the past to receive medications.

## 2022-05-03 NOTE — Telephone Encounter (Signed)
Requested medication (s) are due for refill today: signed 04/24/22  Requested medication (s) are on the active medication list: yes  Last refill:  04/24/22 #90 0 refills  Future visit scheduled: no   Notes to clinic:  signed 04/24/22. [place] and pharmacy tech could not find patient for refills. Please advise . Called patient / DPR for clarification.      Requested Prescriptions  Pending Prescriptions Disp Refills   amLODipine (NORVASC) 10 MG tablet [Pharmacy Med Name: AMLODIPINE BESYLATE 10 MG Tablet] 90 tablet 0    Sig: TAKE 1 TABLET EVERY DAY     Cardiovascular: Calcium Channel Blockers 2 Failed - 05/02/2022  2:27 PM      Failed - Valid encounter within last 6 months    Recent Outpatient Visits           4 months ago Controlled type 2 diabetes mellitus with complication, without long-term current use of insulin ([place])   [place] Pickard, Cammie Mcgee, MD   1 year ago Exacerbation of gout   [place] Susy Frizzle, MD   1 year ago Controlled type 2 diabetes mellitus with complication, without long-term current use of insulin ([place])   [place] Pickard, Cammie Mcgee, MD   1 year ago Acute gout of right foot, unspecified cause   [place] Pickard, Cammie Mcgee, MD   1 year ago Tinnitus of both ears   [place] Pickard, Cammie Mcgee, MD               Passed - Last BP in normal range    BP Readings from Last 1 Encounters:  12/17/21 128/62         Passed - Last Heart Rate in normal range    Pulse Readings from Last 1 Encounters:  12/17/21 100

## 2022-05-03 NOTE — Telephone Encounter (Signed)
[place] to check on status of medication requested. Pharm. Tech Desiree reports patient not found in system .

## 2022-05-05 LAB — HM MAMMOGRAPHY

## 2022-05-14 ENCOUNTER — Other Ambulatory Visit: Payer: Self-pay

## 2022-05-14 NOTE — Patient Outreach (Signed)
[facility]) Care Management  05/14/2022  Ori Kreiter [birth year] [id number]   Telephone call to patient for case closure. Patient doing well.  Blood sugars continue to do well.  Advised patient on program completion. Patient agreeable.    Care Plan : RN Care Manager Plan of Care  Updates made by Jon Billings, RN since 05/14/2022 12:00 AM     Problem: Chronic disease management and care coordination for diabetes.   Priority: High     Long-Range Goal: Develop plan of care for the management of diabetes Completed 05/14/2022  Start Date: 11/12/2021  Expected End Date: 12/01/2022  This Visit's Progress: On track  Priority: High  Note:   Current Barriers:  Chronic Disease Management support and education needs related to DMII   RNCM Clinical Goal(s):  Patient will verbalize basic understanding of  DMII disease process and self health management plan as evidenced by A1c less than 7.0 continue to work with RN Care Manager to address care management and care coordination needs related to  DMII as evidenced by adherence to CM Team Scheduled appointments through collaboration with RN Care manager, provider, and care team.   Interventions: Education and support related to diabetes management Inter-disciplinary care team collaboration (see longitudinal plan of care) Evaluation of current treatment plan related to  self management and patient's adherence to plan as established by provider   Diabetes Interventions:  (Status:  New goal.) Long Term Goal Assessed patient's understanding of A1c goal: <7% Provided education to patient about basic DM disease process Discussed plans with patient for ongoing care management follow up and provided patient with direct contact information for care management team Review of patient status, including review of consultants reports, relevant laboratory and other test results, and medications completed Lab Results  Component Value Date    HGBA1C 6.4 (H) 02/06/2021  01/30/22 Patient doing well.  Last A1c 6.2.  Encouraged continued diabetes management.    05/14/22 Patient continues to do well.  Blood sugars around 100.  Will complete program.    Patient Goals/Self-Care Activities: Diabetes Take all medications as prescribed Perform all self care activities independently  Perform IADL's (shopping, preparing meals, housekeeping, managing finances) independently Call provider office for new concerns or questions  check blood sugar at prescribed times: twice daily check feet daily for cuts, sores or redness enter blood sugar readings and medication or insulin into daily log take the blood sugar log to all doctor visits manage portion size  Follow Up Plan:  Telephone follow up appointment with care management team member scheduled for:  June The patient has been provided with contact information for the care management team and has been advised to call with any health related questions or concerns.      Plan: RN CM closing case as goals met.   Jone Baseman, RN, MSN [facility] Care Management Care Management Coordinator Direct Line [phone] Toll Free: [phone]  Fax: [phone]

## 2022-05-23 NOTE — Patient Instructions (Incomplete)
Carla Wells , Thank you for taking time to come for your Medicare Wellness Visit. I appreciate your ongoing commitment to your health goals. Please review the following plan we discussed and let me know if I can assist you in the future.   Screening recommendations/referrals: Colonoscopy: No longer required due to age. Mammogram: Done 06/22/2021 Repeat annually  Bone Density: Done 07/02/2021 Repeat every 2 years  Recommended yearly ophthalmology/optometry visit for glaucoma screening and checkup Recommended yearly dental visit for hygiene and checkup  Vaccinations: Influenza vaccine: Done 08/31/2021. Repeat annually  Pneumococcal vaccine: Done 11/04/2013, 10/03/2010. Tdap vaccine: Due every 10 years. Shingles vaccine: Available at your local pharmacy.   Covid-19:Done 08/31/2021, 03/13/2021, 02/05/2020 and 01/11/2020.  Advanced directives: Please bring a copy of your health care power of attorney and living will to the office to be added to your chart at your convenience.   Conditions/risks identified: Aim for 30 minutes of exercise or brisk walking, 6-8 glasses of water, and 5 servings of fruits and vegetables each day.   Next appointment: Follow up in one year for your annual wellness visit 2024.   Preventive Care 81 Years and Older, Female Preventive care refers to lifestyle choices and visits with your health care provider that can promote health and wellness. What does preventive care include? A yearly physical exam. This is also called an annual well check. Dental exams once or twice a year. Routine eye exams. Ask your health care provider how often you should have your eyes checked. Personal lifestyle choices, including: Daily care of your teeth and gums. Regular physical activity. Eating a healthy diet. Avoiding tobacco and drug use. Limiting alcohol use. Practicing safe sex. Taking low-dose aspirin every day. Taking vitamin and mineral supplements as recommended by your health  care provider. What happens during an annual well check? The services and screenings done by your health care provider during your annual well check will depend on your age, overall health, lifestyle risk factors, and family history of disease. Counseling  Your health care provider may ask you questions about your: Alcohol use. Tobacco use. Drug use. Emotional well-being. Home and relationship well-being. Sexual activity. Eating habits. History of falls. Memory and ability to understand (cognition). Work and work Statistician. Reproductive health. Screening  You may have the following tests or measurements: Height, weight, and BMI. Blood pressure. Lipid and cholesterol levels. These may be checked every 5 years, or more frequently if you are over 81 years old. Skin check. Lung cancer screening. You may have this screening every year starting at age 81 if you have a 30-pack-year history of smoking and currently smoke or have quit within the past 15 years. Fecal occult blood test (FOBT) of the stool. You may have this test every year starting at age 81. Flexible sigmoidoscopy or colonoscopy. You may have a sigmoidoscopy every 5 years or a colonoscopy every 10 years starting at age 81. Hepatitis C blood test. Hepatitis B blood test. Sexually transmitted disease (STD) testing. Diabetes screening. This is done by checking your blood sugar (glucose) after you have not eaten for a while (fasting). You may have this done every 1-3 years. Bone density scan. This is done to screen for osteoporosis. You may have this done starting at age 81. Mammogram. This may be done every 1-2 years. Talk to your health care provider about how often you should have regular mammograms. Talk with your health care provider about your test results, treatment options, and if necessary, the need for more  tests. Vaccines  Your health care provider may recommend certain vaccines, such as: Influenza vaccine. This is  recommended every year. Tetanus, diphtheria, and acellular pertussis (Tdap, Td) vaccine. You may need a Td booster every 10 years. Zoster vaccine. You may need this after age 81. Pneumococcal 13-valent conjugate (PCV13) vaccine. One dose is recommended after age 81. Pneumococcal polysaccharide (PPSV23) vaccine. One dose is recommended after age 81. Talk to your health care provider about which screenings and vaccines you need and how often you need them. This information is not intended to replace advice given to you by your health care provider. Make sure you discuss any questions you have with your health care provider. Document Released: 12/15/2015 Document Revised: 08/07/2016 Document Reviewed: 09/19/2015 Elsevier Interactive Patient Education  2017 [place] Prevention in the Home Falls can cause injuries. They can happen to people of all ages. There are many things you can do to make your home safe and to help prevent falls. What can I do on the outside of my home? Regularly fix the edges of walkways and driveways and fix any cracks. Remove anything that might make you trip as you walk through a door, such as a raised step or threshold. Trim any bushes or trees on the path to your home. Use bright outdoor lighting. Clear any walking paths of anything that might make someone trip, such as rocks or tools. Regularly check to see if handrails are loose or broken. Make sure that both sides of any steps have handrails. Any raised decks and porches should have guardrails on the edges. Have any leaves, snow, or ice cleared regularly. Use sand or salt on walking paths during winter. Clean up any spills in your garage right away. This includes oil or grease spills. What can I do in the bathroom? Use night lights. Install grab bars by the toilet and in the tub and shower. Do not use towel bars as grab bars. Use non-skid mats or decals in the tub or shower. If you need to sit down in  the shower, use a plastic, non-slip stool. Keep the floor dry. Clean up any water that spills on the floor as soon as it happens. Remove soap buildup in the tub or shower regularly. Attach bath mats securely with double-sided non-slip rug tape. Do not have throw rugs and other things on the floor that can make you trip. What can I do in the bedroom? Use night lights. Make sure that you have a light by your bed that is easy to reach. Do not use any sheets or blankets that are too big for your bed. They should not hang down onto the floor. Have a firm chair that has side arms. You can use this for support while you get dressed. Do not have throw rugs and other things on the floor that can make you trip. What can I do in the kitchen? Clean up any spills right away. Avoid walking on wet floors. Keep items that you use a lot in easy-to-reach places. If you need to reach something above you, use a strong step stool that has a grab bar. Keep electrical cords out of the way. Do not use floor polish or wax that makes floors slippery. If you must use wax, use non-skid floor wax. Do not have throw rugs and other things on the floor that can make you trip. What can I do with my stairs? Do not leave any items on the stairs. Make sure  that there are handrails on both sides of the stairs and use them. Fix handrails that are broken or loose. Make sure that handrails are as long as the stairways. Check any carpeting to make sure that it is firmly attached to the stairs. Fix any carpet that is loose or worn. Avoid having throw rugs at the top or bottom of the stairs. If you do have throw rugs, attach them to the floor with carpet tape. Make sure that you have a light switch at the top of the stairs and the bottom of the stairs. If you do not have them, ask someone to add them for you. What else can I do to help prevent falls? Wear shoes that: Do not have high heels. Have rubber bottoms. Are comfortable  and fit you well. Are closed at the toe. Do not wear sandals. If you use a stepladder: Make sure that it is fully opened. Do not climb a closed stepladder. Make sure that both sides of the stepladder are locked into place. Ask someone to hold it for you, if possible. Clearly mark and make sure that you can see: Any grab bars or handrails. First and last steps. Where the edge of each step is. Use tools that help you move around (mobility aids) if they are needed. These include: Canes. Walkers. Scooters. Crutches. Turn on the lights when you go into a dark area. Replace any light bulbs as soon as they burn out. Set up your furniture so you have a clear path. Avoid moving your furniture around. If any of your floors are uneven, fix them. If there are any pets around you, be aware of where they are. Review your medicines with your doctor. Some medicines can make you feel dizzy. This can increase your chance of falling. Ask your doctor what other things that you can do to help prevent falls. This information is not intended to replace advice given to you by your health care provider. Make sure you discuss any questions you have with your health care provider. Document Released: 09/14/2009 Document Revised: 04/25/2016 Document Reviewed: 12/23/2014 Elsevier Interactive Patient Education  2017 [employer].

## 2022-05-24 ENCOUNTER — Ambulatory Visit (INDEPENDENT_AMBULATORY_CARE_PROVIDER_SITE_OTHER): Payer: Medicare HMO

## 2022-05-24 VITALS — Ht 64.0 in | Wt 171.0 lb

## 2022-05-24 DIAGNOSIS — E1110 Type 2 diabetes mellitus with ketoacidosis without coma: Secondary | ICD-10-CM

## 2022-05-24 DIAGNOSIS — E785 Hyperlipidemia, unspecified: Secondary | ICD-10-CM

## 2022-05-24 DIAGNOSIS — E1169 Type 2 diabetes mellitus with other specified complication: Secondary | ICD-10-CM

## 2022-05-24 DIAGNOSIS — I152 Hypertension secondary to endocrine disorders: Secondary | ICD-10-CM | POA: Diagnosis not present

## 2022-05-24 DIAGNOSIS — Z0000 Encounter for general adult medical examination without abnormal findings: Secondary | ICD-10-CM

## 2022-05-24 DIAGNOSIS — E1159 Type 2 diabetes mellitus with other circulatory complications: Secondary | ICD-10-CM | POA: Diagnosis not present

## 2022-05-24 DIAGNOSIS — E118 Type 2 diabetes mellitus with unspecified complications: Secondary | ICD-10-CM | POA: Diagnosis not present

## 2022-05-30 ENCOUNTER — Ambulatory Visit (INDEPENDENT_AMBULATORY_CARE_PROVIDER_SITE_OTHER): Payer: Medicare HMO | Admitting: Family Medicine

## 2022-05-30 VITALS — BP 117/70 | HR 82 | Temp 97.7°F | Ht 64.0 in | Wt 170.0 lb

## 2022-05-30 DIAGNOSIS — N1831 Chronic kidney disease, stage 3a: Secondary | ICD-10-CM | POA: Diagnosis not present

## 2022-05-30 DIAGNOSIS — E118 Type 2 diabetes mellitus with unspecified complications: Secondary | ICD-10-CM | POA: Diagnosis not present

## 2022-05-30 MED ORDER — ALBUTEROL SULFATE HFA 108 (90 BASE) MCG/ACT IN AERS: 2.0000 | INHALATION_SPRAY | Freq: Four times a day (QID) | RESPIRATORY_TRACT | 0 refills | Status: AC | PRN

## 2022-05-30 NOTE — Progress Notes (Signed)
Subjective:    Patient ID: Carla Wells, female    DOB: 1941/02/09, 81 y.o.   MRN: [id number]  HPI  Patient is a very pleasant 81 year old African-American female who presents today with episodic shortness of breath.  She had an echocardiogram last year that showed an ejection fraction of 65%.  She denies any chest pain.  She denies any hemoptysis or pleurisy.  She denies any cough.  She denies any angina.  She denies any orthopnea.  There is no leg swelling.  She does have a history of a pulmonary embolism that she denies any sharp pleurodynia.  Her pulse oximetry is excellent.  She has a remote history of asthma and in the past use an inhaler.  She states at times when she is outside when it is hot she can hear herself wheeze and she would like to try the albuterol again.  She is overdue for fasting lab work to monitor her diabetes.  Her blood pressure today is outstanding Past Medical History:  Diagnosis Date   Abnormal pap    Cataract    Colon polyps    Diabetes mellitus without complication ([place])    Hyperlipidemia    Hypertension    Murmur    No past surgical history on file. Current Outpatient Medications on File Prior to Visit  Medication Sig Dispense Refill   allopurinol (ZYLOPRIM) 100 MG tablet Take 1 tablet (100 mg total) by mouth daily. 90 tablet 2   amLODipine (NORVASC) 10 MG tablet TAKE 1 TABLET EVERY DAY 90 tablet 0   blood glucose meter kit and supplies KIT Dispense based on patient and insurance preference. Use up to four times daily as directed. (FOR ICD-9 250.00, 250.01). 1 each 0   Calcium Citrate-Vitamin D (CALCIUM + D PO) Take 1 tablet by mouth daily.     Cholecalciferol (VITAMIN D3 PO) Take 1 tablet by mouth daily.     Colchicine (MITIGARE) 0.6 MG CAPS Take one tablet by mouth daily 90 capsule 0   fluticasone (FLONASE) 50 MCG/ACT nasal spray Place 2 sprays into both nostrils daily.     Insulin Pen Needle 32G X 4 MM MISC 1 Container by Does not apply route in the  morning and at bedtime. 100 each 0   latanoprost (XALATAN) 0.005 % ophthalmic solution Place 1 drop into both eyes at bedtime.      losartan-hydrochlorothiazide (HYZAAR) 50-12.5 MG tablet TAKE 1 TABLET EVERY DAY 90 tablet 3   metFORMIN (GLUCOPHAGE) 500 MG tablet TAKE 2 TABLETS TWICE DAILY WITH A MEAL 360 tablet 3   omeprazole (PRILOSEC) 40 MG capsule TAKE 1 CAPSULE EVERY DAY 90 capsule 3   simvastatin (ZOCOR) 10 MG tablet TAKE 1 TABLET AT BEDTIME 90 tablet 0   No current facility-administered medications on file prior to visit.   Allergies  Allergen Reactions   Penicillins Swelling    Facial swelling   Social History   Socioeconomic History   Marital status: Single    Spouse name: Not on file   Number of children: 5   Years of education: Not on file   Highest education level: Not on file  Occupational History   Not on file  Tobacco Use   Smoking status: Never   Smokeless tobacco: Former    Types: Snuff  Substance and Sexual Activity   Alcohol use: No   Drug use: No   Sexual activity: Not on file  Other Topics Concern   Not on file  Social History Narrative  5 Children   11 grandchildren   8 great grandchildren.   Sees family often, most live locally.   Keeps great granddaughter 2-3 days per week.   Social Determinants of Health   Financial Resource Strain: Low Risk  (05/24/2022)   Overall Financial Resource Strain (CARDIA)    Difficulty of Paying Living Expenses: Not very hard  Food Insecurity: No Food Insecurity (05/24/2022)   Hunger Vital Sign    Worried About Running Out of Food in the Last Year: Never true    Ran Out of Food in the Last Year: Never true  Transportation Needs: No Transportation Needs (05/24/2022)   PRAPARE - Hydrologist (Medical): No    Lack of Transportation (Non-Medical): No  Physical Activity: Insufficiently Active (05/24/2022)   Exercise Vital Sign    Days of Exercise per Week: 3 days    Minutes of Exercise per  Session: 20 min  Stress: No Stress Concern Present (05/24/2022)   [place]    Feeling of Stress : Not at all  Social Connections: Moderately Integrated (05/24/2022)   Social Connection and Isolation Panel [NHANES]    Frequency of Communication with Friends and Family: More than three times a week    Frequency of Social Gatherings with Friends and Family: More than three times a week    Attends Religious Services: More than 4 times per year    Active Member of [employer] or Organizations: Yes    Attends Archivist Meetings: More than 4 times per year    Marital Status: Never married  Intimate Partner Violence: Not At Risk (05/24/2022)   Humiliation, Afraid, Rape, and Kick questionnaire    Fear of Current or Ex-Partner: No    Emotionally Abused: No    Physically Abused: No    Sexually Abused: No     Review of Systems  All other systems reviewed and are negative.      Objective:   Physical Exam Vitals reviewed.  Constitutional:      General: She is not in acute distress.    Appearance: Normal appearance. She is normal weight. She is not ill-appearing or toxic-appearing.  HENT:     Head: Normocephalic and atraumatic.  Neck:     Vascular: No carotid bruit.  Cardiovascular:     Rate and Rhythm: Normal rate and regular rhythm.     Heart sounds: Normal heart sounds. No murmur heard.    No gallop.  Pulmonary:     Effort: Pulmonary effort is normal. No respiratory distress.     Breath sounds: Normal breath sounds. No stridor. No wheezing, rhonchi or rales.  Abdominal:     General: Abdomen is flat. Bowel sounds are normal. There is no distension.     Palpations: Abdomen is soft.     Tenderness: There is no abdominal tenderness. There is no guarding or rebound.  Musculoskeletal:     Right lower leg: No edema.     Left lower leg: No edema.  Skin:    General: Skin is warm and dry.     Coloration: Skin is not  jaundiced or pale.     Findings: No bruising, erythema, lesion or rash.  Neurological:     General: No focal deficit present.     Mental Status: She is alert and oriented to person, place, and time. Mental status is at baseline.     Cranial Nerves: No cranial nerve deficit.  Sensory: No sensory deficit.     Motor: No weakness.     Coordination: Coordination normal.     Gait: Gait normal.     Deep Tendon Reflexes: Reflexes normal.  Psychiatric:        Mood and Affect: Mood normal.        Behavior: Behavior normal.        Thought Content: Thought content normal.        Judgment: Judgment normal.           Assessment & Plan:  Controlled type 2 diabetes mellitus with complication, without long-term current use of insulin (HCC) - Plan: CBC with Differential/Platelet, Lipid panel, COMPLETE METABOLIC PANEL WITH GFR, Hemoglobin A1c  Stage 3a chronic kidney disease ([place]) Very happy with her blood pressure.  I want to ensure that her diabetes remains well controlled as it was in January.  Check CBC CMP lipid panel and A1c.  Goal LDL cholesterol is less than 100.  Goal A1c is less than 6.5.  I suspect that she may be having bronchospasms particular related to heat and humidity.  We will try albuterol 2 puffs every 6 hours as needed.  If she develops chest pain or worsening shortness of breath I would like to get a chest x-ray and consider a stress test.  However today the patient seems comfortable and denies any chest pain or respiratory distress

## 2022-05-30 NOTE — Addendum Note (Signed)
Addended by: Jenna Luo T on: 05/30/2022 09:32 AM   Modules accepted: Level of Service

## 2022-05-31 LAB — COMPLETE METABOLIC PANEL WITH GFR
AG Ratio: 1.1 (calc) (ref 1.0–2.5)
ALT: 12 U/L (ref 6–29)
AST: 16 U/L (ref 10–35)
Albumin: 4 g/dL (ref 3.6–5.1)
Alkaline phosphatase (APISO): 83 U/L (ref 37–153)
BUN/Creatinine Ratio: 24 (calc) — ABNORMAL HIGH (ref 6–22)
BUN: 25 mg/dL (ref 7–25)
CO2: 24 mmol/L (ref 20–32)
Calcium: 10.3 mg/dL (ref 8.6–10.4)
Chloride: 105 mmol/L (ref 98–110)
Creat: 1.03 mg/dL — ABNORMAL HIGH (ref 0.60–0.95)
Globulin: 3.5 g/dL (calc) (ref 1.9–3.7)
Glucose, Bld: 103 mg/dL — ABNORMAL HIGH (ref 65–99)
Potassium: 4.8 mmol/L (ref 3.5–5.3)
Sodium: 139 mmol/L (ref 135–146)
Total Bilirubin: 0.4 mg/dL (ref 0.2–1.2)
Total Protein: 7.5 g/dL (ref 6.1–8.1)
eGFR: 55 mL/min/{1.73_m2} — ABNORMAL LOW (ref >=60)

## 2022-05-31 LAB — CBC WITH DIFFERENTIAL/PLATELET
Absolute Monocytes: 610 cells/uL (ref 200–950)
Basophils Absolute: 40 cells/uL (ref 0–200)
Basophils Relative: 0.7 %
Eosinophils Absolute: 51 cells/uL (ref 15–500)
Eosinophils Relative: 0.9 %
HCT: 42.4 % (ref 35.0–45.0)
Hemoglobin: 13.7 g/dL (ref 11.7–15.5)
Lymphs Abs: 1704 cells/uL (ref 850–3900)
MCH: 27.2 pg (ref 27.0–33.0)
MCHC: 32.3 g/dL (ref 32.0–36.0)
MCV: 84.1 fL (ref 80.0–100.0)
MPV: 12.4 fL (ref 7.5–12.5)
Monocytes Relative: 10.7 %
Neutro Abs: 3295 cells/uL (ref 1500–7800)
Neutrophils Relative %: 57.8 %
Platelets: 295 10*3/uL (ref 140–400)
RBC: 5.04 10*6/uL (ref 3.80–5.10)
RDW: 14.5 % (ref 11.0–15.0)
Total Lymphocyte: 29.9 %
WBC: 5.7 10*3/uL (ref 3.8–10.8)

## 2022-05-31 LAB — HEMOGLOBIN A1C
Hgb A1c MFr Bld: 5.9 % of total Hgb — ABNORMAL HIGH (ref <5.7)
Mean Plasma Glucose: 123 mg/dL
eAG (mmol/L): 6.8 mmol/L

## 2022-05-31 LAB — LIPID PANEL
Cholesterol: 160 mg/dL (ref <200)
HDL: 50 mg/dL (ref >=50)
LDL Cholesterol (Calc): 90 mg/dL (calc)
Non-HDL Cholesterol (Calc): 110 mg/dL (calc) (ref <130)
Total CHOL/HDL Ratio: 3.2 (calc) (ref <5.0)
Triglycerides: 102 mg/dL (ref <150)

## 2022-06-03 DIAGNOSIS — Z1231 Encounter for screening mammogram for malignant neoplasm of breast: Secondary | ICD-10-CM | POA: Diagnosis not present

## 2022-07-16 ENCOUNTER — Encounter: Payer: Self-pay | Admitting: Family Medicine

## 2022-08-02 ENCOUNTER — Ambulatory Visit: Payer: Self-pay

## 2022-08-28 ENCOUNTER — Other Ambulatory Visit: Payer: Self-pay | Admitting: Family Medicine

## 2022-10-22 DIAGNOSIS — H401131 Primary open-angle glaucoma, bilateral, mild stage: Secondary | ICD-10-CM | POA: Diagnosis not present

## 2022-10-22 DIAGNOSIS — H35033 Hypertensive retinopathy, bilateral: Secondary | ICD-10-CM | POA: Diagnosis not present

## 2022-10-22 DIAGNOSIS — H524 Presbyopia: Secondary | ICD-10-CM | POA: Diagnosis not present

## 2022-10-22 DIAGNOSIS — H26493 Other secondary cataract, bilateral: Secondary | ICD-10-CM | POA: Diagnosis not present

## 2022-10-22 DIAGNOSIS — H43823 Vitreomacular adhesion, bilateral: Secondary | ICD-10-CM | POA: Diagnosis not present

## 2022-10-22 LAB — HM DIABETES EYE EXAM

## 2022-12-11 ENCOUNTER — Other Ambulatory Visit: Payer: Self-pay | Admitting: Family Medicine

## 2022-12-11 NOTE — Telephone Encounter (Signed)
Requested medication (s) are due for refill today: yes  Requested medication (s) are on the active medication list: yes  Last refill:  05/06/22 #90 0 refills  Future visit scheduled: no  Notes to clinic:  last OV 05/30/22. Last ordered medication by Claretta Fraise, MD 05/06/22. Do you want to refill Rx?     Requested Prescriptions  Pending Prescriptions Disp Refills   amLODipine (NORVASC) 10 MG tablet [Pharmacy Med Name: AMLODIPINE BESYLATE 10 MG Tablet] 90 tablet 3    Sig: TAKE 1 TABLET EVERY DAY     Cardiovascular: Calcium Channel Blockers 2 Failed - 12/11/2022 12:17 PM      Failed - Valid encounter within last 6 months    Recent Outpatient Visits           11 months ago Controlled type 2 diabetes mellitus with complication, without long-term current use of insulin ([place])   [place] Pickard, Cammie Mcgee, MD   1 year ago Exacerbation of gout   [place] Susy Frizzle, MD   1 year ago Controlled type 2 diabetes mellitus with complication, without long-term current use of insulin ([place])   [place] Pickard, Cammie Mcgee, MD   1 year ago Acute gout of right foot, unspecified cause   [place] Pickard, Cammie Mcgee, MD   2 years ago Tinnitus of both ears   [place] Pickard, Cammie Mcgee, MD              Passed - Last BP in normal range    BP Readings from Last 1 Encounters:  05/30/22 117/70         Passed - Last Heart Rate in normal range    Pulse Readings from Last 1 Encounters:  05/30/22 82

## 2023-01-11 ENCOUNTER — Other Ambulatory Visit: Payer: Self-pay | Admitting: Family Medicine

## 2023-01-13 NOTE — Telephone Encounter (Signed)
Requested Prescriptions  Pending Prescriptions Disp Refills   simvastatin (ZOCOR) 10 MG tablet [Pharmacy Med Name: SIMVASTATIN 10 MG Tablet] 90 tablet 1    Sig: TAKE 1 TABLET AT BEDTIME     Cardiovascular:  Antilipid - Statins Failed - 01/11/2023  3:14 AM      Failed - Valid encounter within last 12 months    Recent Outpatient Visits           1 year ago Controlled type 2 diabetes mellitus with complication, without long-term current use of insulin ([place])   [place] Pickard, Cammie Mcgee, MD   1 year ago Exacerbation of gout   [place] Susy Frizzle, MD   1 year ago Controlled type 2 diabetes mellitus with complication, without long-term current use of insulin ([place])   [place] Susy Frizzle, MD   2 years ago Acute gout of right foot, unspecified cause   [place] Pickard, Cammie Mcgee, MD   2 years ago Tinnitus of both ears   [place] Susy Frizzle, MD              Failed - Lipid Panel in normal range within the last 12 months    Cholesterol  Date Value Ref Range Status  05/30/2022 160 <200 mg/dL Final   LDL Cholesterol (Calc)  Date Value Ref Range Status  05/30/2022 90 mg/dL (calc) Final    Comment:    Reference range: <100 . Desirable range <100 mg/dL for primary prevention;   <70 mg/dL for patients with CHD or diabetic patients  with > or = 2 CHD risk factors. Marland Kitchen LDL-C is now calculated using the Martin-Hopkins  calculation, which is a validated novel method providing  better accuracy than the Friedewald equation in the  estimation of LDL-C.  Cresenciano Genre et al. Annamaria Helling. MU:[id number]): 2061-2068  (http://education.Quest[employer].com/faq/FAQ164)    HDL  Date Value Ref Range Status  05/30/2022 50 > OR = 50 mg/dL Final   Triglycerides  Date Value Ref Range Status  05/30/2022 102 <150 mg/dL Final         Passed - Patient is not pregnant

## 2023-01-22 DIAGNOSIS — H401131 Primary open-angle glaucoma, bilateral, mild stage: Secondary | ICD-10-CM | POA: Diagnosis not present

## 2023-01-22 LAB — HM DIABETES EYE EXAM

## 2023-02-04 ENCOUNTER — Encounter: Payer: Self-pay | Admitting: Family Medicine

## 2023-02-21 ENCOUNTER — Other Ambulatory Visit: Payer: Self-pay

## 2023-02-21 ENCOUNTER — Telehealth: Payer: Self-pay

## 2023-02-21 DIAGNOSIS — M109 Gout, unspecified: Secondary | ICD-10-CM

## 2023-02-21 MED ORDER — ALLOPURINOL 100 MG PO TABS: 100.0000 mg | ORAL_TABLET | Freq: Every day | ORAL | 1 refills | Status: DC

## 2023-02-21 NOTE — Telephone Encounter (Signed)
Prescription Request  02/21/2023  LOV: 05/30/22  What is the name of the medication or equipment? allopurinol (ZYLOPRIM) 100 MG tablet [id number]  Have you contacted your pharmacy to request a refill? Yes   Which pharmacy would you like this sent to?  [id number] [place], [place] - [address] [place] [address] Phone: [phone] Fax: [phone]    Patient notified that their request is being sent to the clinical staff for review and that they should receive a response within 2 business days.   Please advise at [facility] [phone]

## 2023-03-10 ENCOUNTER — Other Ambulatory Visit: Payer: Self-pay | Admitting: Family Medicine

## 2023-03-11 NOTE — Telephone Encounter (Signed)
Requested Prescriptions  Pending Prescriptions Disp Refills   metFORMIN (GLUCOPHAGE) 500 MG tablet [Pharmacy Med Name: METFORMIN HYDROCHLORIDE 500 MG Tablet] 360 tablet 0    Sig: TAKE 2 TABLETS TWICE DAILY WITH A MEAL     Endocrinology:  Diabetes - Biguanides Failed - 03/10/2023  1:43 AM      Failed - Cr in normal range and within 360 days    Creat  Date Value Ref Range Status  05/30/2022 1.03 (H) 0.60 - 0.95 mg/dL Final         Failed - HBA1C is between 0 and 7.9 and within 180 days    Hgb A1c MFr Bld  Date Value Ref Range Status  05/30/2022 5.9 (H) <5.7 % of total Hgb Final    Comment:    For someone without known diabetes, a hemoglobin  A1c value between 5.7% and 6.4% is consistent with prediabetes and should be confirmed with a  follow-up test. . For someone with known diabetes, a value <7% indicates that their diabetes is well controlled. A1c targets should be individualized based on duration of diabetes, age, comorbid conditions, and other considerations. . This assay result is consistent with an increased risk of diabetes. . Currently, no consensus exists regarding use of hemoglobin A1c for diagnosis of diabetes for children. .          Failed - eGFR in normal range and within 360 days    GFR, Est African American  Date Value Ref Range Status  02/06/2021 51 (L) > OR = 60 mL/min/1.50m2 Final   GFR, Est Non African American  Date Value Ref Range Status  02/06/2021 44 (L) > OR = 60 mL/min/1.42m2 Final   eGFR  Date Value Ref Range Status  05/30/2022 55 (L) > OR = 60 mL/min/1.85m2 Final    Comment:    The eGFR is based on the CKD-EPI 2021 equation. To calculate  the new eGFR from a previous Creatinine or Cystatin C result, go to https://www.kidney.org/professionals/ kdoqi/gfr%5Fcalculator          Failed - B12 Level in normal range and within 720 days    No results found for: "VITAMINB12"       Failed - Valid encounter within last 6 months    Recent  Outpatient Visits           1 year ago Controlled type 2 diabetes mellitus with complication, without long-term current use of insulin (HCC)   [facility] Pickard, Priscille Heidelberg, MD   1 year ago Exacerbation of gout   [facility] Donita Brooks, MD   2 years ago Controlled type 2 diabetes mellitus with complication, without long-term current use of insulin (HCC)   [facility] Donita Brooks, MD   2 years ago Acute gout of right foot, unspecified cause   [facility] Pickard, Priscille Heidelberg, MD   2 years ago Tinnitus of both ears   [facility] Pickard, Priscille Heidelberg, MD       Future Appointments             In 1 week Tanya Nones, Priscille Heidelberg, MD [facility]            Passed - CBC within normal limits and completed in the last 12 months    WBC  Date Value Ref Range Status  05/30/2022 5.7 3.8 - 10.8 Thousand/uL Final   RBC  Date Value Ref Range  Status  05/30/2022 5.04 3.80 - 5.10 Million/uL Final   Hemoglobin  Date Value Ref Range Status  05/30/2022 13.7 11.7 - 15.5 g/dL Final   HCT  Date Value Ref Range Status  05/30/2022 42.4 35.0 - 45.0 % Final   MCHC  Date Value Ref Range Status  05/30/2022 32.3 32.0 - 36.0 g/dL Final   [facility]  Date Value Ref Range Status  05/30/2022 27.2 27.0 - 33.0 pg Final   MCV  Date Value Ref Range Status  05/30/2022 84.1 80.0 - 100.0 fL Final   No results found for: "PLTCOUNTKUC", "LABPLAT", "POCPLA" RDW  Date Value Ref Range Status  05/30/2022 14.5 11.0 - 15.0 % Final

## 2023-03-13 ENCOUNTER — Other Ambulatory Visit: Payer: Self-pay

## 2023-03-13 ENCOUNTER — Telehealth: Payer: Self-pay

## 2023-03-13 DIAGNOSIS — E1159 Type 2 diabetes mellitus with other circulatory complications: Secondary | ICD-10-CM

## 2023-03-13 MED ORDER — AMLODIPINE BESYLATE 10 MG PO TABS: 10.0000 mg | ORAL_TABLET | Freq: Every day | ORAL | 0 refills | Status: DC

## 2023-03-13 NOTE — Telephone Encounter (Signed)
Prescription Request  03/13/2023  LOV: 05/30/22 UPCOMING CPE APPT 03/18/23  What is the name of the medication or equipment? amLODipine (NORVASC) 10 MG tablet [[id number]]  Have you contacted your pharmacy to request a refill? Yes   Which pharmacy would you like this sent to?  [id number] [place], [place] - [address] AT [facility] [address] [place] [address] Phone: [phone] Fax: [phone]    Patient notified that their request is being sent to the clinical staff for review and that they should receive a response within 2 business days.   Please advise at [facility] [phone]

## 2023-03-18 ENCOUNTER — Encounter: Payer: Self-pay | Admitting: Family Medicine

## 2023-03-18 ENCOUNTER — Ambulatory Visit (INDEPENDENT_AMBULATORY_CARE_PROVIDER_SITE_OTHER): Payer: Medicare HMO | Admitting: Family Medicine

## 2023-03-18 VITALS — BP 132/76 | HR 87 | Temp 97.7°F | Ht 64.0 in | Wt 166.2 lb

## 2023-03-18 DIAGNOSIS — E118 Type 2 diabetes mellitus with unspecified complications: Secondary | ICD-10-CM | POA: Diagnosis not present

## 2023-03-18 DIAGNOSIS — I152 Hypertension secondary to endocrine disorders: Secondary | ICD-10-CM

## 2023-03-18 DIAGNOSIS — E1159 Type 2 diabetes mellitus with other circulatory complications: Secondary | ICD-10-CM | POA: Diagnosis not present

## 2023-03-18 DIAGNOSIS — Z0000 Encounter for general adult medical examination without abnormal findings: Secondary | ICD-10-CM

## 2023-03-18 DIAGNOSIS — Z0001 Encounter for general adult medical examination with abnormal findings: Secondary | ICD-10-CM

## 2023-03-18 DIAGNOSIS — R0609 Other forms of dyspnea: Secondary | ICD-10-CM

## 2023-03-18 DIAGNOSIS — E1169 Type 2 diabetes mellitus with other specified complication: Secondary | ICD-10-CM

## 2023-03-18 DIAGNOSIS — E785 Hyperlipidemia, unspecified: Secondary | ICD-10-CM | POA: Diagnosis not present

## 2023-03-18 DIAGNOSIS — N1831 Chronic kidney disease, stage 3a: Secondary | ICD-10-CM | POA: Diagnosis not present

## 2023-03-18 NOTE — Progress Notes (Signed)
Subjective:    Patient ID: Carla Wells, female    DOB: 10-Mar-1941, 82 y.o.   MRN: [id number]  HPI  Patient is a very pleasant 82 year old African-American female who presents today for CPE.  Patient seems to be doing extremely well.  She checks her sugars twice a day.  Her blood sugars are always under 160.  She states that she never sees her sugars above 200.  She denies any hypoglycemic episodes.  She denies any falls.  She denies any memory issues.  She denies any depression.  I reviewed her vaccinations.  She has had the shingles vaccine along with both pneumonia vaccines.  She had the most recent COVID shot.  Therefore her immunizations are up-to-date.  She is due for mammogram in July but she is scheduled for this.  Due to her age I do not recommend a colonoscopy or Pap smear.  She is due to check lab work.  She has 1 concern of her review of systems.  She reports increasing dyspnea on exertion.  She states that she is becoming increasingly more short winded with activity.  If she has to walk she becomes very easily winded.  If she works in her yard she becomes more easily winded.  She denies any cough.  She denies any pleurisy.  She denies any hemoptysis.  She denies any angina or chest pain.  However she has significant cardiac risk factors including type 2 diabetes, hypertension, and advanced age. Past Medical History:  Diagnosis Date   Abnormal pap    Cataract    Colon polyps    Diabetes mellitus without complication (HCC)    Hyperlipidemia    Hypertension    Murmur    No past surgical history on file. Current Outpatient Medications on File Prior to Visit  Medication Sig Dispense Refill   albuterol (VENTOLIN HFA) 108 (90 Base) MCG/ACT inhaler Inhale 2 puffs into the lungs every 6 (six) hours as needed for wheezing or shortness of breath. 8 g 0   allopurinol (ZYLOPRIM) 100 MG tablet Take 1 tablet (100 mg total) by mouth daily. 90 tablet 1   amLODipine (NORVASC) 10 MG tablet Take 1  tablet (10 mg total) by mouth daily. 90 tablet 0   blood glucose meter kit and supplies KIT Dispense based on patient and insurance preference. Use up to four times daily as directed. (FOR ICD-9 250.00, 250.01). 1 each 0   Calcium Citrate-Vitamin D (CALCIUM + D PO) Take 1 tablet by mouth daily.     Cholecalciferol (VITAMIN D3 PO) Take 1 tablet by mouth daily.     Colchicine (MITIGARE) 0.6 MG CAPS Take one tablet by mouth daily 90 capsule 0   fluticasone (FLONASE) 50 MCG/ACT nasal spray Place 2 sprays into both nostrils daily.     Insulin Pen Needle 32G X 4 MM MISC 1 Container by Does not apply route in the morning and at bedtime. 100 each 0   latanoprost (XALATAN) 0.005 % ophthalmic solution Place 1 drop into both eyes at bedtime.      losartan-hydrochlorothiazide (HYZAAR) 50-12.5 MG tablet TAKE 1 TABLET EVERY DAY 90 tablet 3   metFORMIN (GLUCOPHAGE) 500 MG tablet TAKE 2 TABLETS TWICE DAILY WITH A MEAL 360 tablet 0   omeprazole (PRILOSEC) 40 MG capsule TAKE 1 CAPSULE EVERY DAY 90 capsule 3   simvastatin (ZOCOR) 10 MG tablet TAKE 1 TABLET AT BEDTIME 90 tablet 1   No current facility-administered medications on file prior to visit.  Allergies  Allergen Reactions   Penicillins Swelling    Facial swelling   Social History   Socioeconomic History   Marital status: Single    Spouse name: Not on file   Number of children: 5   Years of education: Not on file   Highest education level: Not on file  Occupational History   Not on file  Tobacco Use   Smoking status: Never   Smokeless tobacco: Former    Types: Snuff  Substance and Sexual Activity   Alcohol use: No   Drug use: No   Sexual activity: Not on file  Other Topics Concern   Not on file  Social History Narrative   5 Children   11 grandchildren   8 great grandchildren.   Sees family often, most live locally.   Keeps great granddaughter 2-3 days per week.   Social Determinants of Health   Financial Resource Strain: Low Risk   (05/24/2022)   Overall Financial Resource Strain (CARDIA)    Difficulty of Paying Living Expenses: Not very hard  Food Insecurity: No Food Insecurity (05/24/2022)   Hunger Vital Sign    Worried About Running Out of Food in the Last Year: Never true    Ran Out of Food in the Last Year: Never true  Transportation Needs: No Transportation Needs (05/24/2022)   PRAPARE - Administrator, Civil Service (Medical): No    Lack of Transportation (Non-Medical): No  Physical Activity: Insufficiently Active (05/24/2022)   Exercise Vital Sign    Days of Exercise per Week: 3 days    Minutes of Exercise per Session: 20 min  Stress: No Stress Concern Present (05/24/2022)   [employer] of Occupational Health - Occupational Stress Questionnaire    Feeling of Stress : Not at all  Social Connections: Moderately Integrated (05/24/2022)   Social Connection and Isolation Panel [NHANES]    Frequency of Communication with Friends and Family: More than three times a week    Frequency of Social Gatherings with Friends and Family: More than three times a week    Attends Religious Services: More than 4 times per year    Active Member of [employer] or Organizations: Yes    Attends Banker Meetings: More than 4 times per year    Marital Status: Never married  Intimate Partner Violence: Not At Risk (05/24/2022)   Humiliation, Afraid, Rape, and Kick questionnaire    Fear of Current or Ex-Partner: No    Emotionally Abused: No    Physically Abused: No    Sexually Abused: No     Review of Systems  All other systems reviewed and are negative.      Objective:   Physical Exam Vitals reviewed.  Constitutional:      General: She is not in acute distress.    Appearance: Normal appearance. She is normal weight. She is not ill-appearing or toxic-appearing.  HENT:     Head: Normocephalic and atraumatic.  Neck:     Vascular: No carotid bruit.  Cardiovascular:     Rate and Rhythm: Normal rate  and regular rhythm.     Heart sounds: Normal heart sounds. No murmur heard.    No gallop.  Pulmonary:     Effort: Pulmonary effort is normal. No respiratory distress.     Breath sounds: Normal breath sounds. No stridor. No wheezing, rhonchi or rales.  Abdominal:     General: Abdomen is flat. Bowel sounds are normal. There is no distension.  Palpations: Abdomen is soft.     Tenderness: There is no abdominal tenderness. There is no guarding or rebound.  Musculoskeletal:     Right lower leg: No edema.     Left lower leg: No edema.  Skin:    General: Skin is warm and dry.     Coloration: Skin is not jaundiced or pale.     Findings: No bruising, erythema, lesion or rash.  Neurological:     General: No focal deficit present.     Mental Status: She is alert and oriented to person, place, and time. Mental status is at baseline.     Cranial Nerves: No cranial nerve deficit.     Sensory: No sensory deficit.     Motor: No weakness.     Coordination: Coordination normal.     Gait: Gait normal.     Deep Tendon Reflexes: Reflexes normal.  Psychiatric:        Mood and Affect: Mood normal.        Behavior: Behavior normal.        Thought Content: Thought content normal.        Judgment: Judgment normal.           Assessment & Plan:  Encounter for Medicare annual wellness exam  Controlled type 2 diabetes mellitus with complication, without long-term current use of insulin - Plan: Hemoglobin A1c, CBC with Differential/Platelet, Lipid panel, COMPLETE METABOLIC PANEL WITH GFR, Protein / Creatinine Ratio, Urine  Hypertension associated with diabetes  Stage 3a chronic kidney disease  Hyperlipidemia associated with type 2 diabetes mellitus  Dyspnea on exertion - Plan: Ambulatory referral to Cardiology Regarding her physical, her immunizations are up-to-date.  Her mammogram is due in July.  This would complete her annual cancer screening.  Her blood pressure today is outstanding.  Her  reported blood sugars are still excellent.  I will check a CBC, CMP, lipid panel, and an A1c.  Goal A1c is less than 7.  Goal LDL cholesterol is less than 100.  Her dyspnea on exertion could be deconditioning.  She last had an echocardiogram in 2022 this did show diastolic dysfunction.  I would like her to see cardiology for possible stress test to rule out exercise-induced ischemia given her risk factors.  She denies any falls, depression, or memory loss

## 2023-03-19 LAB — PROTEIN / CREATININE RATIO, URINE
Creatinine, Urine: 199 mg/dL (ref 20–275)
Protein/Creat Ratio: 121 mg/g creat (ref 24–184)
Protein/Creatinine Ratio: 0.121 mg/mg creat (ref 0.024–0.184)
Total Protein, Urine: 24 mg/dL (ref 5–24)

## 2023-03-19 LAB — CBC WITH DIFFERENTIAL/PLATELET
Absolute Monocytes: 508 cells/uL (ref 200–950)
Basophils Absolute: 38 cells/uL (ref 0–200)
Basophils Relative: 0.8 %
Eosinophils Absolute: 52 cells/uL (ref 15–500)
Eosinophils Relative: 1.1 %
HCT: 39.1 % (ref 35.0–45.0)
Hemoglobin: 12.6 g/dL (ref 11.7–15.5)
Lymphs Abs: 1565 cells/uL (ref 850–3900)
MCH: 26.8 pg — ABNORMAL LOW (ref 27.0–33.0)
MCHC: 32.2 g/dL (ref 32.0–36.0)
MCV: 83 fL (ref 80.0–100.0)
MPV: 12.3 fL (ref 7.5–12.5)
Monocytes Relative: 10.8 %
Neutro Abs: 2538 cells/uL (ref 1500–7800)
Neutrophils Relative %: 54 %
Platelets: 297 10*3/uL (ref 140–400)
RBC: 4.71 10*6/uL (ref 3.80–5.10)
RDW: 14.2 % (ref 11.0–15.0)
Total Lymphocyte: 33.3 %
WBC: 4.7 10*3/uL (ref 3.8–10.8)

## 2023-03-19 LAB — COMPLETE METABOLIC PANEL WITH GFR
AG Ratio: 1.4 (calc) (ref 1.0–2.5)
ALT: 10 U/L (ref 6–29)
AST: 14 U/L (ref 10–35)
Albumin: 4.3 g/dL (ref 3.6–5.1)
Alkaline phosphatase (APISO): 93 U/L (ref 37–153)
BUN/Creatinine Ratio: 25 (calc) — ABNORMAL HIGH (ref 6–22)
BUN: 27 mg/dL — ABNORMAL HIGH (ref 7–25)
CO2: 26 mmol/L (ref 20–32)
Calcium: 10.1 mg/dL (ref 8.6–10.4)
Chloride: 105 mmol/L (ref 98–110)
Creat: 1.09 mg/dL — ABNORMAL HIGH (ref 0.60–0.95)
Globulin: 3 g/dL (calc) (ref 1.9–3.7)
Glucose, Bld: 99 mg/dL (ref 65–99)
Potassium: 4.5 mmol/L (ref 3.5–5.3)
Sodium: 139 mmol/L (ref 135–146)
Total Bilirubin: 0.4 mg/dL (ref 0.2–1.2)
Total Protein: 7.3 g/dL (ref 6.1–8.1)
eGFR: 51 mL/min/{1.73_m2} — ABNORMAL LOW (ref >=60)

## 2023-03-19 LAB — LIPID PANEL
Cholesterol: 157 mg/dL (ref <200)
HDL: 52 mg/dL (ref >=50)
LDL Cholesterol (Calc): 86 mg/dL (calc)
Non-HDL Cholesterol (Calc): 105 mg/dL (calc) (ref <130)
Total CHOL/HDL Ratio: 3 (calc) (ref <5.0)
Triglycerides: 97 mg/dL (ref <150)

## 2023-03-19 LAB — HEMOGLOBIN A1C
Hgb A1c MFr Bld: 6.2 % of total Hgb — ABNORMAL HIGH (ref <5.7)
Mean Plasma Glucose: 131 mg/dL
eAG (mmol/L): 7.3 mmol/L

## 2023-04-15 ENCOUNTER — Ambulatory Visit: Payer: Medicare HMO | Attending: Cardiovascular Disease | Admitting: Cardiovascular Disease

## 2023-04-15 ENCOUNTER — Encounter: Payer: Self-pay | Admitting: Cardiovascular Disease

## 2023-04-15 VITALS — BP 147/80 | HR 79 | Ht 64.0 in | Wt 174.4 lb

## 2023-04-15 DIAGNOSIS — I152 Hypertension secondary to endocrine disorders: Secondary | ICD-10-CM | POA: Diagnosis not present

## 2023-04-15 DIAGNOSIS — E785 Hyperlipidemia, unspecified: Secondary | ICD-10-CM

## 2023-04-15 DIAGNOSIS — E1169 Type 2 diabetes mellitus with other specified complication: Secondary | ICD-10-CM | POA: Diagnosis not present

## 2023-04-15 DIAGNOSIS — I2699 Other pulmonary embolism without acute cor pulmonale: Secondary | ICD-10-CM | POA: Diagnosis not present

## 2023-04-15 DIAGNOSIS — E1159 Type 2 diabetes mellitus with other circulatory complications: Secondary | ICD-10-CM

## 2023-04-15 DIAGNOSIS — E782 Mixed hyperlipidemia: Secondary | ICD-10-CM

## 2023-04-15 DIAGNOSIS — Z7984 Long term (current) use of oral hypoglycemic drugs: Secondary | ICD-10-CM | POA: Diagnosis not present

## 2023-04-15 NOTE — Assessment & Plan Note (Signed)
History of hyperlipidemia on statin therapy with lipid profile performed 03/18/2023 revealing total cholesterol 157, LDL of 86 and HDL of 52.

## 2023-04-15 NOTE — Patient Instructions (Addendum)
Medication Instructions:  Your physician recommends that you continue on your current medications as directed. Please refer to the Current Medication list given to you today.  *If you need a refill on your cardiac medications before your next appointment, please call your pharmacy*   Testing/Procedures: Your physician has requested that you have an echocardiogram. Echocardiography is a painless test that uses sound waves to create images of your heart. It provides your doctor with information about the size and shape of your heart and how well your heart's chambers and valves are working. This procedure takes approximately one hour. There are no restrictions for this procedure. Please do NOT wear cologne, perfume, aftershave, or lotions (deodorant is allowed). Please arrive 15 minutes prior to your appointment time. This will take place at [address] [place]   Non-Cardiac CT Angiography (CTA) chest, is a special type of CT scan that uses a computer to produce multi-dimensional views of major blood vessels throughout the body. In CT angiography, a contrast material is injected through an IV to help visualize the blood vessels    Dr. Allyson Sabal has ordered a CT coronary calcium score.   Test locations:  [facility]  [facility]  This is $99 out of pocket.   Coronary CalciumScan A coronary calcium scan is an imaging test used to look for deposits of calcium and other fatty materials (plaques) in the inner lining of the blood vessels of the heart (coronary arteries). These deposits of calcium and plaques can partly clog and narrow the coronary arteries without producing any symptoms or warning signs. This puts a person at risk for a heart attack. This test can detect these deposits before symptoms develop. Tell a health care provider about: Any allergies you have. All medicines you are taking,  including vitamins, herbs, eye drops, creams, and over-the-counter medicines. Any problems you or family members have had with anesthetic medicines. Any blood disorders you have. Any surgeries you have had. Any medical conditions you have. Whether you are pregnant or may be pregnant. What are the risks? Generally, this is a safe procedure. However, problems may occur, including: Harm to a pregnant woman and her unborn baby. This test involves the use of radiation. Radiation exposure can be dangerous to a pregnant woman and her unborn baby. If you are pregnant, you generally should not have this procedure done. Slight increase in the risk of cancer. This is because of the radiation involved in the test. What happens before the procedure? No preparation is needed for this procedure. What happens during the procedure? You will undress and remove any jewelry around your neck or chest. You will put on a hospital gown. Sticky electrodes will be placed on your chest. The electrodes will be connected to an electrocardiogram (ECG) machine to record a tracing of the electrical activity of your heart. A CT scanner will take pictures of your heart. During this time, you will be asked to lie still and hold your breath for 2-3 seconds while a picture of your heart is being taken. The procedure may vary among health care providers and hospitals. What happens after the procedure? You can get dressed. You can return to your normal activities. It is up to you to get the results of your test. Ask your health care provider, or the department that is doing the test, when your results will be ready. Summary A coronary calcium scan is an  imaging test used to look for deposits of calcium and other fatty materials (plaques) in the inner lining of the blood vessels of the heart (coronary arteries). Generally, this is a safe procedure. Tell your health care provider if you are pregnant or may be pregnant. No  preparation is needed for this procedure. A CT scanner will take pictures of your heart. You can return to your normal activities after the scan is done. This information is not intended to replace advice given to you by your health care provider. Make sure you discuss any questions you have with your health care provider. Document Released: 05/16/2008 Document Revised: 10/07/2016 Document Reviewed: 10/07/2016 Elsevier Interactive Patient Education  2017 [employer].    Follow-Up: At [facility], you and your health needs are our priority.  As part of our continuing mission to provide you with exceptional heart care, we have created designated Provider Care Teams.  These Care Teams include your primary Cardiologist (physician) and Advanced Practice Providers (APPs -  Physician Assistants and Nurse Practitioners) who all work together to provide you with the care you need, when you need it.  We recommend signing up for the patient portal called "MyChart".  Sign up information is provided on this After Visit Summary.  MyChart is used to connect with patients for Virtual Visits (Telemedicine).  Patients are able to view lab/test results, encounter notes, upcoming appointments, etc.  Non-urgent messages can be sent to your provider as well.   To learn more about what you can do with MyChart, go to [email or web address].    Your next appointment:   3 month(s)  Provider:   Nanetta Batty, MD

## 2023-04-15 NOTE — Assessment & Plan Note (Signed)
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  CTA 09/03/2020 showed large burden embolus in the distal pulmonary arteries and nearly all the distal lobar and subsegmental branches with enlargement of the RV/LV ratio of 1.6-1.  She started noticing increasing shortness of breath at that time.  She apparently was on Eliquis for at least a year according to her daughter.  She has had progressive dyspnea since.  I am going to get a chest CTA to further look at her pulmonary vasculature, a 2D echo and a coronary calcium score.  I will see her back after that for further evaluation.  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

## 2023-04-15 NOTE — Progress Notes (Signed)
04/15/2023 Carla Wells   [birth year]  [id number]  Primary Physician Carla Wells Priscille Heidelberg, MD Primary Cardiologist: Runell Gess MD Nicholes Calamity, [place]  HPI:  Carla Wells is a 82 y.o. mildly overweight divorced African-American female mother of 5, grandmother 17 grandchildren who is accompanied by her daughter Carla Wells today.  She was referred by Dr. Lynnea Ferrier for evaluation of progressive dyspnea.  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  She never smoked.  She does have a history of hypertension, diabetes and hyperlipidemia.  Both her parents apparently had myocardial infarction's.  She is never had heart attack or stroke.  She did contract COVID in 2019 and had DVT/PE in 2021.  She had a chest CTA performed 09/03/2020 that showed extensive pulmonary artery bilaterally and distal pulmonary arteries and also of the segmental and lobar branches.  She was on Eliquis for at least a year according to her daughter.  She dates her increasing dyspnea from this time.   Current Meds  Medication Sig   albuterol (VENTOLIN HFA) 108 (90 Base) MCG/ACT inhaler Inhale 2 puffs into the lungs every 6 (six) hours as needed for wheezing or shortness of breath.   allopurinol (ZYLOPRIM) 100 MG tablet Take 1 tablet (100 mg total) by mouth daily.   amLODipine (NORVASC) 10 MG tablet Take 1 tablet (10 mg total) by mouth daily.   blood glucose meter kit and supplies KIT Dispense based on patient and insurance preference. Use up to four times daily as directed. (FOR ICD-9 250.00, 250.01).   Calcium Citrate-Vitamin D (CALCIUM + D PO) Take 1 tablet by mouth daily.   Cholecalciferol (VITAMIN D3 PO) Take 1 tablet by mouth daily.   Colchicine (MITIGARE) 0.6 MG CAPS Take one tablet by mouth daily   fluticasone (FLONASE) 50 MCG/ACT nasal spray Place 2 sprays into both nostrils daily.   Insulin Pen Needle 32G X 4 MM MISC 1 Container by Does not apply route in the  morning and at bedtime.   latanoprost (XALATAN) 0.005 % ophthalmic solution Place 1 drop into both eyes at bedtime.    losartan-hydrochlorothiazide (HYZAAR) 50-12.5 MG tablet TAKE 1 TABLET EVERY DAY   metFORMIN (GLUCOPHAGE) 500 MG tablet TAKE 2 TABLETS TWICE DAILY WITH A MEAL   omeprazole (PRILOSEC) 40 MG capsule TAKE 1 CAPSULE EVERY DAY   simvastatin (ZOCOR) 10 MG tablet TAKE 1 TABLET AT BEDTIME     Allergies  Allergen Reactions   Penicillins Swelling    Facial swelling    Social History   Socioeconomic History   Marital status: Single    Spouse name: Not on file   Number of children: 5   Years of education: Not on file   Highest education level: Not on file  Occupational History   Not on file  Tobacco Use   Smoking status: Never   Smokeless tobacco: Former    Types: Snuff  Substance and Sexual Activity   Alcohol use: No   Drug use: No   Sexual activity: Not on file  Other Topics Concern   Not on file  Social History Narrative   5 Children   11 grandchildren   8 great grandchildren.   Sees family often, most live locally.   Keeps great granddaughter 2-3 days per week.   Social Determinants of Health   Financial Resource Strain: Low Risk  (05/24/2022)   Overall Financial Resource Strain (CARDIA)  Difficulty of Paying Living Expenses: Not very hard  Food Insecurity: No Food Insecurity (05/24/2022)   Hunger Vital Sign    Worried About Running Out of Food in the Last Year: Never true    Ran Out of Food in the Last Year: Never true  Transportation Needs: No Transportation Needs (05/24/2022)   PRAPARE - Administrator, Civil Service (Medical): No    Lack of Transportation (Non-Medical): No  Physical Activity: Insufficiently Active (05/24/2022)   Exercise Vital Sign    Days of Exercise per Week: 3 days    Minutes of Exercise per Session: 20 min  Stress: No Stress Concern Present (05/24/2022)   [employer] of Occupational Health - Occupational Stress  Questionnaire    Feeling of Stress : Not at all  Social Connections: Moderately Integrated (05/24/2022)   Social Connection and Isolation Panel [NHANES]    Frequency of Communication with Friends and Family: More than three times a week    Frequency of Social Gatherings with Friends and Family: More than three times a week    Attends Religious Services: More than 4 times per year    Active Member of [employer] or Organizations: Yes    Attends Banker Meetings: More than 4 times per year    Marital Status: Never married  Intimate Partner Violence: Not At Risk (05/24/2022)   Humiliation, Afraid, Rape, and Kick questionnaire    Fear of Current or Ex-Partner: No    Emotionally Abused: No    Physically Abused: No    Sexually Abused: No     Review of Systems: General: negative for chills, fever, night sweats or weight changes.  Cardiovascular: negative for chest pain, dyspnea on exertion, edema, orthopnea, palpitations, paroxysmal nocturnal dyspnea or shortness of breath Dermatological: negative for rash Respiratory: negative for cough or wheezing Urologic: negative for hematuria Abdominal: negative for nausea, vomiting, diarrhea, bright red blood per rectum, melena, or hematemesis Neurologic: negative for visual changes, syncope, or dizziness All other systems reviewed and are otherwise negative except as noted above.    Blood pressure (!) 147/80, pulse 79, height 5\' 4"  (1.626 m), weight 174 lb 6.4 oz (79.1 kg), SpO2 99 %.  General appearance: alert and no distress Neck: no adenopathy, no carotid bruit, no JVD, supple, symmetrical, trachea midline, and thyroid not enlarged, symmetric, no tenderness/mass/nodules Lungs: clear to auscultation bilaterally Heart: regular rate and rhythm, S1, S2 normal, no murmur, click, rub or gallop Extremities: extremities normal, atraumatic, no cyanosis or edema Pulses: 2+ and symmetric Skin: Skin color, texture, turgor normal. No rashes or  lesions Neurologic: Grossly normal  EKG normal sinus rhythm at 79 with nonspecific ST-T wave changes.  Personally reviewed this EKG.  ASSESSMENT AND PLAN:   Hypertension associated with diabetes (HCC) History of essential hypertension blood pressure measured at 147/80.  She is on amlodipine, losartan and hydrochlorothiazide.  Hyperlipidemia History of hyperlipidemia on statin therapy with lipid profile performed 03/18/2023 revealing total cholesterol 157, LDL of 86 and HDL of 52.  Acute pulmonary embolus [facility]) Patient apparently had COVID in 2021 and had DVT/PE.  CTA 09/03/2020 showed large burden embolus in the distal pulmonary arteries and nearly all the distal lobar and subsegmental branches with enlargement of the RV/LV ratio of 1.6-1.  She started noticing increasing shortness of breath at that time.  She apparently was on Eliquis for at least a year according to her daughter.  She has had progressive dyspnea since.  I am going to get a  chest CTA to further look at her pulmonary vasculature, a 2D echo and a coronary calcium score.  I will see her back after that for further evaluation.  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  Hyperlipidemia associated with type 2 diabetes mellitus (HCC) History of hyperlipidemia on statin therapy with lipid profile performed 03/18/2023 revealing a total cholesterol of 57, LDL of 86 and HDL 52.     Runell Gess MD [facility], [facility] 04/15/2023 2:51 PM

## 2023-04-15 NOTE — Assessment & Plan Note (Signed)
History of hyperlipidemia on statin therapy with lipid profile performed 03/18/2023 revealing a total cholesterol of 57, LDL of 86 and HDL 52.

## 2023-04-15 NOTE — Assessment & Plan Note (Signed)
History of essential hypertension blood pressure measured at 147/80.  She is on amlodipine, losartan and hydrochlorothiazide.

## 2023-04-21 DIAGNOSIS — H401131 Primary open-angle glaucoma, bilateral, mild stage: Secondary | ICD-10-CM | POA: Diagnosis not present

## 2023-04-21 LAB — HM DIABETES EYE EXAM

## 2023-05-08 ENCOUNTER — Encounter (HOSPITAL_BASED_OUTPATIENT_CLINIC_OR_DEPARTMENT_OTHER): Payer: Self-pay

## 2023-05-08 ENCOUNTER — Encounter: Payer: Self-pay | Admitting: Family Medicine

## 2023-05-08 ENCOUNTER — Ambulatory Visit (HOSPITAL_BASED_OUTPATIENT_CLINIC_OR_DEPARTMENT_OTHER)
Admission: RE | Admit: 2023-05-08 | Discharge: 2023-05-08 | Disposition: A | Discharge status: Home / Self Care | Payer: Medicare HMO | Source: Ambulatory Visit | Attending: Cardiovascular Disease | Admitting: Cardiovascular Disease

## 2023-05-08 DIAGNOSIS — E1169 Type 2 diabetes mellitus with other specified complication: Secondary | ICD-10-CM | POA: Diagnosis not present

## 2023-05-08 DIAGNOSIS — I152 Hypertension secondary to endocrine disorders: Secondary | ICD-10-CM | POA: Diagnosis not present

## 2023-05-08 DIAGNOSIS — E782 Mixed hyperlipidemia: Secondary | ICD-10-CM | POA: Diagnosis not present

## 2023-05-08 DIAGNOSIS — R0602 Shortness of breath: Secondary | ICD-10-CM | POA: Diagnosis not present

## 2023-05-08 DIAGNOSIS — E1159 Type 2 diabetes mellitus with other circulatory complications: Secondary | ICD-10-CM | POA: Insufficient documentation

## 2023-05-08 DIAGNOSIS — E785 Hyperlipidemia, unspecified: Secondary | ICD-10-CM | POA: Diagnosis not present

## 2023-05-08 DIAGNOSIS — I2699 Other pulmonary embolism without acute cor pulmonale: Secondary | ICD-10-CM | POA: Diagnosis not present

## 2023-05-08 LAB — POCT I-STAT CREATININE: Creatinine, Ser: 1.3 mg/dL — ABNORMAL HIGH (ref 0.44–1.00)

## 2023-05-08 MED ORDER — IOHEXOL 350 MG/ML SOLN
100.0000 mL | Freq: Once | INTRAVENOUS | Status: AC | PRN
  Administered 2023-05-08: 75 mL via INTRAVENOUS

## 2023-05-16 ENCOUNTER — Telehealth: Payer: Self-pay

## 2023-05-16 ENCOUNTER — Ambulatory Visit (HOSPITAL_COMMUNITY): Payer: Medicare HMO | Attending: Cardiovascular Disease

## 2023-05-16 DIAGNOSIS — E1159 Type 2 diabetes mellitus with other circulatory complications: Secondary | ICD-10-CM | POA: Diagnosis not present

## 2023-05-16 DIAGNOSIS — E785 Hyperlipidemia, unspecified: Secondary | ICD-10-CM | POA: Diagnosis not present

## 2023-05-16 DIAGNOSIS — I152 Hypertension secondary to endocrine disorders: Secondary | ICD-10-CM | POA: Diagnosis not present

## 2023-05-16 DIAGNOSIS — R0609 Other forms of dyspnea: Secondary | ICD-10-CM

## 2023-05-16 DIAGNOSIS — E782 Mixed hyperlipidemia: Secondary | ICD-10-CM | POA: Insufficient documentation

## 2023-05-16 DIAGNOSIS — I10 Essential (primary) hypertension: Secondary | ICD-10-CM | POA: Diagnosis not present

## 2023-05-16 DIAGNOSIS — I2699 Other pulmonary embolism without acute cor pulmonale: Secondary | ICD-10-CM | POA: Insufficient documentation

## 2023-05-16 DIAGNOSIS — E1169 Type 2 diabetes mellitus with other specified complication: Secondary | ICD-10-CM

## 2023-05-16 LAB — ECHOCARDIOGRAM COMPLETE
Area-P 1/2: 3.99 cm2
Calc EF: 63.2 %
S' Lateral: 2.4 cm
Single Plane A2C EF: 61.4 %
Single Plane A4C EF: 64.1 %

## 2023-05-16 NOTE — Telephone Encounter (Signed)
Pt's daughter called in to speak with nurse to discuss the results of pt's ct scan results. Please advise.  Cb#: [phone]

## 2023-05-21 NOTE — Progress Notes (Signed)
Subjective:   Carla Wells is a 82 y.o. female who presents for Medicare Annual (Subsequent) preventive examination.  Visit Complete: {VISITMETHOD:[id number]}  Patient Medicare AWV questionnaire was completed by the patient on ***; I have confirmed that all information answered by patient is correct and no changes since this date.  Review of Systems    ***       Objective:    There were no vitals filed for this visit. There is no height or weight on file to calculate BMI.     05/24/2022   10:05 AM 11/12/2021   10:25 AM 05/18/2021    9:55 AM 11/08/2020    9:37 AM 09/07/2020   11:38 AM 09/04/2020    6:31 PM 09/17/2018    9:28 AM  Advanced Directives  Does Patient Have a Medical Advance Directive? Yes Yes Yes Yes Yes Yes No  Type of Estate agent of [place];Living will Healthcare Power of Attorney Living will Healthcare Power of [employer] Power of Attorney Healthcare Power of Attorney   Does patient want to make changes to medical advance directive?   No - Patient declined No - Patient declined No - Patient declined No - Patient declined   Copy of Healthcare Power of Attorney in Chart? No - copy requested    No - copy requested No - copy requested     Current Medications (verified) Outpatient Encounter Medications as of 05/22/2023  Medication Sig   albuterol (VENTOLIN HFA) 108 (90 Base) MCG/ACT inhaler Inhale 2 puffs into the lungs every 6 (six) hours as needed for wheezing or shortness of breath.   allopurinol (ZYLOPRIM) 100 MG tablet Take 1 tablet (100 mg total) by mouth daily.   amLODipine (NORVASC) 10 MG tablet Take 1 tablet (10 mg total) by mouth daily.   blood glucose meter kit and supplies KIT Dispense based on patient and insurance preference. Use up to four times daily as directed. (FOR ICD-9 250.00, 250.01).   Calcium Citrate-Vitamin D (CALCIUM + D PO) Take 1 tablet by mouth daily.   Cholecalciferol (VITAMIN D3 PO) Take 1 tablet by mouth  daily.   Colchicine (MITIGARE) 0.6 MG CAPS Take one tablet by mouth daily   fluticasone (FLONASE) 50 MCG/ACT nasal spray Place 2 sprays into both nostrils daily.   Insulin Pen Needle 32G X 4 MM MISC 1 Container by Does not apply route in the morning and at bedtime.   latanoprost (XALATAN) 0.005 % ophthalmic solution Place 1 drop into both eyes at bedtime.    losartan-hydrochlorothiazide (HYZAAR) 50-12.5 MG tablet TAKE 1 TABLET EVERY DAY   metFORMIN (GLUCOPHAGE) 500 MG tablet TAKE 2 TABLETS TWICE DAILY WITH A MEAL   omeprazole (PRILOSEC) 40 MG capsule TAKE 1 CAPSULE EVERY DAY   simvastatin (ZOCOR) 10 MG tablet TAKE 1 TABLET AT BEDTIME   No facility-administered encounter medications on file as of 05/22/2023.    Allergies (verified) Penicillins   History: Past Medical History:  Diagnosis Date   Abnormal pap    Cataract    Colon polyps    Diabetes mellitus without complication (HCC)    Hyperlipidemia    Hypertension    Murmur    No past surgical history on file. Family History  Problem Relation Age of Onset   Stroke Mother    Kidney failure Father    Diabetes Brother    Cancer Brother        Lung - Smoker   Lung cancer Brother    Cervical cancer  Sister    Cancer Sister    Cancer Sister        ?Cervical CA   Social History   Socioeconomic History   Marital status: Single    Spouse name: Not on file   Number of children: 5   Years of education: Not on file   Highest education level: Not on file  Occupational History   Not on file  Tobacco Use   Smoking status: Never   Smokeless tobacco: Former    Types: Snuff  Substance and Sexual Activity   Alcohol use: No   Drug use: No   Sexual activity: Not on file  Other Topics Concern   Not on file  Social History Narrative   5 Children   11 grandchildren   8 great grandchildren.   Sees family often, most live locally.   Keeps great granddaughter 2-3 days per week.   Social Determinants of Health   Financial  Resource Strain: Low Risk  (05/24/2022)   Overall Financial Resource Strain (CARDIA)    Difficulty of Paying Living Expenses: Not very hard  Food Insecurity: No Food Insecurity (05/24/2022)   Hunger Vital Sign    Worried About Running Out of Food in the Last Year: Never true    Ran Out of Food in the Last Year: Never true  Transportation Needs: No Transportation Needs (05/24/2022)   PRAPARE - Administrator, Civil Service (Medical): No    Lack of Transportation (Non-Medical): No  Physical Activity: Insufficiently Active (05/24/2022)   Exercise Vital Sign    Days of Exercise per Week: 3 days    Minutes of Exercise per Session: 20 min  Stress: No Stress Concern Present (05/24/2022)   [employer] of Occupational Health - Occupational Stress Questionnaire    Feeling of Stress : Not at all  Social Connections: Moderately Integrated (05/24/2022)   Social Connection and Isolation Panel [NHANES]    Frequency of Communication with Friends and Family: More than three times a week    Frequency of Social Gatherings with Friends and Family: More than three times a week    Attends Religious Services: More than 4 times per year    Active Member of [employer] or Organizations: Yes    Attends Engineer, structural: More than 4 times per year    Marital Status: Never married    Tobacco Counseling Counseling given: Not Answered   Clinical Intake:                        Activities of Daily Living    05/24/2022   10:06 AM  In your present state of health, do you have any difficulty performing the following activities:  Hearing? 0  Vision? 0  Difficulty concentrating or making decisions? 0  Walking or climbing stairs? 0  Dressing or bathing? 0  Doing errands, shopping? 0  Preparing Food and eating ? N  Using the Toilet? N  In the past six months, have you accidently leaked urine? N  Do you have problems with loss of bowel control? N  Managing your Medications? N   Managing your Finances? N  Housekeeping or managing your Housekeeping? N    Patient Care Team: Donita Brooks, MD as PCP - General (Family Medicine)  Indicate any recent Medical Services you may have received from other than [facility] providers in the past year (date may be approximate).     Assessment:   This is a  routine wellness examination for Cristal.  Hearing/Vision screen No results found.  Dietary issues and exercise activities discussed:     Goals Addressed   None    Depression Screen    03/18/2023   10:02 AM 05/24/2022    9:59 AM 01/30/2022   10:52 AM 12/17/2021    9:20 AM 11/12/2021   10:24 AM 05/18/2021    9:57 AM 05/15/2021   10:01 AM  PHQ 2/9 Scores  PHQ - 2 Score 0 0 0 0 0 0 0  PHQ- 9 Score    0       Fall Risk    03/18/2023   10:02 AM 05/24/2022   10:05 AM 01/30/2022   10:52 AM 12/17/2021    9:20 AM 11/12/2021   10:23 AM  Fall Risk   Falls in the past year? 0 0 0 0 0  Number falls in past yr: 0 0  0   Injury with Fall? 0 0  0   Risk for fall due to : No Fall Risks No Fall Risks     Follow up Falls prevention discussed Falls prevention discussed       MEDICARE RISK AT HOME:   TIMED UP AND GO:  Was the test performed?  No    Cognitive Function:        05/24/2022   10:07 AM  6CIT Screen  What Year? 0 points  What month? 0 points  What time? 0 points  Count back from 20 0 points  Months in reverse 4 points  Repeat phrase 4 points  Total Score 8 points    Immunizations Immunization History  Administered Date(s) Administered   Fluad Quad(high Dose 65+) 08/27/2019, 09/11/2020   Influenza Split 11/06/2012   Influenza, High Dose Seasonal PF 09/09/2018   Influenza,inj,Quad PF,6+ Mos 09/16/2013, 09/06/2014, 09/07/2015, 09/26/2016, 09/11/2017   Influenza-Unspecified 08/31/2021   PFIZER(Purple Top)SARS-COV-2 Vaccination 01/11/2020, 02/05/2020, 03/13/2021   Pfizer Covid-19 Vaccine Bivalent Booster 44yrs & up 08/31/2021   Pneumococcal  Conjugate-13 11/04/2013   Pneumococcal Polysaccharide-23 10/03/2010    TDAP status: Due, Education has been provided regarding the importance of this vaccine. Advised may receive this vaccine at local pharmacy or Health Dept. Aware to provide a copy of the vaccination record if obtained from local pharmacy or Health Dept. Verbalized acceptance and understanding.  Pneumococcal vaccine status: Up to date  Covid-19 vaccine status: Information provided on how to obtain vaccines.   Qualifies for Shingles Vaccine? Yes   Zostavax completed No   Shingrix Completed?: Yes  Screening Tests Health Maintenance  Topic Date Due   DTaP/Tdap/Td (1 - Tdap) Never done   Zoster Vaccines- Shingrix (1 of 2) Never done   COVID-19 Vaccine (5 - 2023-24 season) 08/02/2022   FOOT EXAM  12/17/2022   MAMMOGRAM  06/04/2023   INFLUENZA VACCINE  07/03/2023   HEMOGLOBIN A1C  09/17/2023   Diabetic kidney evaluation - eGFR measurement  03/17/2024   Diabetic kidney evaluation - Urine ACR  03/17/2024   Medicare Annual Wellness (AWV)  03/17/2024   OPHTHALMOLOGY EXAM  04/20/2024   Pneumonia Vaccine 37+ Years old  Completed   DEXA SCAN  Completed   HPV VACCINES  Aged Out    Health Maintenance  Health Maintenance Due  Topic Date Due   DTaP/Tdap/Td (1 - Tdap) Never done   Zoster Vaccines- Shingrix (1 of 2) Never done   COVID-19 Vaccine (5 - 2023-24 season) 08/02/2022   FOOT EXAM  12/17/2022    Colorectal cancer screening:  No longer required.   Mammogram status: No longer required due to age and preference.  Bone Density status: Completed 07/02/21. Results reflect: Bone density results: NORMAL. Repeat every 5 years.  Lung Cancer Screening: (Low Dose CT Chest recommended if Age 40-80 years, 20 pack-year currently smoking OR have quit w/in 15years.) does not qualify.   Lung Cancer Screening Referral: n/a  Additional Screening:  Hepatitis C Screening: does not qualify;  Vision Screening: Recommended annual  ophthalmology exams for early detection of glaucoma and other disorders of the eye. Is the patient up to date with their annual eye exam?  Yes  Who is the provider or what is the name of the office in which the patient attends annual eye exams? [facility]  If pt is not established with a provider, would they like to be referred to a provider to establish care? No .   Dental Screening: Recommended annual dental exams for proper oral hygiene  Diabetic Foot Exam: Diabetic Foot Exam: Overdue, Pt has been advised about the importance in completing this exam. Pt is scheduled for diabetic foot exam on at next office visit.  Community Resource Referral / Chronic Care Management: CRR required this visit?  {YES/NO:21197}  CCM required this visit?  {CCM Required choices:[phone]}     Plan:     I have personally reviewed and noted the following in the patient's chart:   Medical and social history Use of alcohol, tobacco or illicit drugs  Current medications and supplements including opioid prescriptions. {Opioid Prescriptions:[phone]} Functional ability and status Nutritional status Physical activity Advanced directives List of other physicians Hospitalizations, surgeries, and ER visits in previous 12 months Vitals Screenings to include cognitive, depression, and falls Referrals and appointments  In addition, I have reviewed and discussed with patient certain preventive protocols, quality metrics, and best practice recommendations. A written personalized care plan for preventive services as well as general preventive health recommendations were provided to patient.     Kandis Fantasia [place], [place]   1/61/0960   After Visit Summary: (Mail) Due to this being a telephonic visit, the after visit summary with patients personalized plan was offered to patient via mail   Nurse Notes: ***

## 2023-05-21 NOTE — Patient Instructions (Incomplete)
[name] , Thank you for taking time to come for your Medicare Wellness Visit. I appreciate your ongoing commitment to your health goals. Please review the following plan we discussed and let me know if I can assist you in the future.   These are the goals we discussed:  Goals      Exercise 3x per week (30 min per time)     Continue to stay active.         This is a list of the screening recommended for you and due dates:  Health Maintenance  Topic Date Due   DTaP/Tdap/Td vaccine (1 - Tdap) Never done   Zoster (Shingles) Vaccine (1 of 2) Never done   COVID-19 Vaccine (5 - 2023-24 season) 08/02/2022   Complete foot exam   12/17/2022   Mammogram  06/04/2023   Flu Shot  07/03/2023   Hemoglobin A1C  09/17/2023   Yearly kidney function blood test for diabetes  03/17/2024   Yearly kidney health urinalysis for diabetes  03/17/2024   Medicare Annual Wellness Visit  03/17/2024   Eye exam for diabetics  04/20/2024   Pneumonia Vaccine  Completed   DEXA scan (bone density measurement)  Completed   HPV Vaccine  Aged Out    Advanced directives: Information on Advanced Care Planning can be found at [facility] of [facility] Advance Health Care Directives Advance Health Care Directives ([email or web address] Please bring a copy of your health care power of attorney and living will to the office to be added to your chart at your convenience.  Conditions/risks identified: Aim for 30 minutes of exercise or brisk walking, 6-8 glasses of water, and 5 servings of fruits and vegetables each day.  Next appointment: Follow up in one year for your annual wellness visit    Preventive Care 65 Years and Older, Female Preventive care refers to lifestyle choices and visits with your health care provider that can promote health and wellness. What does preventive care include? A yearly physical exam. This is also called an annual well check. Dental exams once or twice a year. Routine eye exams. Ask your  health care provider how often you should have your eyes checked. Personal lifestyle choices, including: Daily care of your teeth and gums. Regular physical activity. Eating a healthy diet. Avoiding tobacco and drug use. Limiting alcohol use. Practicing safe sex. Taking low-dose aspirin every day. Taking vitamin and mineral supplements as recommended by your health care provider. What happens during an annual well check? The services and screenings done by your health care provider during your annual well check will depend on your age, overall health, lifestyle risk factors, and family history of disease. Counseling  Your health care provider may ask you questions about your: Alcohol use. Tobacco use. Drug use. Emotional well-being. Home and relationship well-being. Sexual activity. Eating habits. History of falls. Memory and ability to understand (cognition). Work and work Astronomer. Reproductive health. Screening  You may have the following tests or measurements: Height, weight, and BMI. Blood pressure. Lipid and cholesterol levels. These may be checked every 5 years, or more frequently if you are over 68 years old. Skin check. Lung cancer screening. You may have this screening every year starting at age 71 if you have a 30-pack-year history of smoking and currently smoke or have quit within the past 15 years. Fecal occult blood test (FOBT) of the stool. You may have this test every year starting at age 54. Flexible sigmoidoscopy or colonoscopy. You may have  a sigmoidoscopy every 5 years or a colonoscopy every 10 years starting at age 57. Hepatitis C blood test. Hepatitis B blood test. Sexually transmitted disease (STD) testing. Diabetes screening. This is done by checking your blood sugar (glucose) after you have not eaten for a while (fasting). You may have this done every 1-3 years. Bone density scan. This is done to screen for osteoporosis. You may have this done  starting at age 63. Mammogram. This may be done every 1-2 years. Talk to your health care provider about how often you should have regular mammograms. Talk with your health care provider about your test results, treatment options, and if necessary, the need for more tests. Vaccines  Your health care provider may recommend certain vaccines, such as: Influenza vaccine. This is recommended every year. Tetanus, diphtheria, and acellular pertussis (Tdap, Td) vaccine. You may need a Td booster every 10 years. Zoster vaccine. You may need this after age 63. Pneumococcal 13-valent conjugate (PCV13) vaccine. One dose is recommended after age 17. Pneumococcal polysaccharide (PPSV23) vaccine. One dose is recommended after age 53. Talk to your health care provider about which screenings and vaccines you need and how often you need them. This information is not intended to replace advice given to you by your health care provider. Make sure you discuss any questions you have with your health care provider. Document Released: 12/15/2015 Document Revised: 08/07/2016 Document Reviewed: 09/19/2015 Elsevier Interactive Patient Education  2017 [employer].  Fall Prevention in the Home Falls can cause injuries. They can happen to people of all ages. There are many things you can do to make your home safe and to help prevent falls. What can I do on the outside of my home? Regularly fix the edges of walkways and driveways and fix any cracks. Remove anything that might make you trip as you walk through a door, such as a raised step or threshold. Trim any bushes or trees on the path to your home. Use bright outdoor lighting. Clear any walking paths of anything that might make someone trip, such as rocks or tools. Regularly check to see if handrails are loose or broken. Make sure that both sides of any steps have handrails. Any raised decks and porches should have guardrails on the edges. Have any leaves, snow, or  ice cleared regularly. Use sand or salt on walking paths during winter. Clean up any spills in your garage right away. This includes oil or grease spills. What can I do in the bathroom? Use night lights. Install grab bars by the toilet and in the tub and shower. Do not use towel bars as grab bars. Use non-skid mats or decals in the tub or shower. If you need to sit down in the shower, use a plastic, non-slip stool. Keep the floor dry. Clean up any water that spills on the floor as soon as it happens. Remove soap buildup in the tub or shower regularly. Attach bath mats securely with double-sided non-slip rug tape. Do not have throw rugs and other things on the floor that can make you trip. What can I do in the bedroom? Use night lights. Make sure that you have a light by your bed that is easy to reach. Do not use any sheets or blankets that are too big for your bed. They should not hang down onto the floor. Have a firm chair that has side arms. You can use this for support while you get dressed. Do not have throw rugs and  other things on the floor that can make you trip. What can I do in the kitchen? Clean up any spills right away. Avoid walking on wet floors. Keep items that you use a lot in easy-to-reach places. If you need to reach something above you, use a strong step stool that has a grab bar. Keep electrical cords out of the way. Do not use floor polish or wax that makes floors slippery. If you must use wax, use non-skid floor wax. Do not have throw rugs and other things on the floor that can make you trip. What can I do with my stairs? Do not leave any items on the stairs. Make sure that there are handrails on both sides of the stairs and use them. Fix handrails that are broken or loose. Make sure that handrails are as long as the stairways. Check any carpeting to make sure that it is firmly attached to the stairs. Fix any carpet that is loose or worn. Avoid having throw rugs at  the top or bottom of the stairs. If you do have throw rugs, attach them to the floor with carpet tape. Make sure that you have a light switch at the top of the stairs and the bottom of the stairs. If you do not have them, ask someone to add them for you. What else can I do to help prevent falls? Wear shoes that: Do not have high heels. Have rubber bottoms. Are comfortable and fit you well. Are closed at the toe. Do not wear sandals. If you use a stepladder: Make sure that it is fully opened. Do not climb a closed stepladder. Make sure that both sides of the stepladder are locked into place. Ask someone to hold it for you, if possible. Clearly mark and make sure that you can see: Any grab bars or handrails. First and last steps. Where the edge of each step is. Use tools that help you move around (mobility aids) if they are needed. These include: Canes. Walkers. Scooters. Crutches. Turn on the lights when you go into a dark area. Replace any light bulbs as soon as they burn out. Set up your furniture so you have a clear path. Avoid moving your furniture around. If any of your floors are uneven, fix them. If there are any pets around you, be aware of where they are. Review your medicines with your doctor. Some medicines can make you feel dizzy. This can increase your chance of falling. Ask your doctor what other things that you can do to help prevent falls. This information is not intended to replace advice given to you by your health care provider. Make sure you discuss any questions you have with your health care provider. Document Released: 09/14/2009 Document Revised: 04/25/2016 Document Reviewed: 12/23/2014 Elsevier Interactive Patient Education  2017 [employer].

## 2023-05-22 ENCOUNTER — Ambulatory Visit (INDEPENDENT_AMBULATORY_CARE_PROVIDER_SITE_OTHER): Payer: Medicare HMO

## 2023-05-22 VITALS — Ht 64.0 in | Wt 174.0 lb

## 2023-05-22 DIAGNOSIS — Z0000 Encounter for general adult medical examination without abnormal findings: Secondary | ICD-10-CM

## 2023-05-23 ENCOUNTER — Other Ambulatory Visit: Payer: Self-pay | Admitting: Family Medicine

## 2023-05-23 DIAGNOSIS — H1045 Other chronic allergic conjunctivitis: Secondary | ICD-10-CM | POA: Diagnosis not present

## 2023-05-23 DIAGNOSIS — H401131 Primary open-angle glaucoma, bilateral, mild stage: Secondary | ICD-10-CM | POA: Diagnosis not present

## 2023-05-23 NOTE — Telephone Encounter (Signed)
Pt called in again today to discuss her lab results and to see what her A1C is? Please advise   Cb#: [id number]

## 2023-06-06 DIAGNOSIS — Z1231 Encounter for screening mammogram for malignant neoplasm of breast: Secondary | ICD-10-CM | POA: Diagnosis not present

## 2023-06-06 LAB — HM MAMMOGRAPHY

## 2023-06-08 ENCOUNTER — Other Ambulatory Visit: Payer: Self-pay | Admitting: Family Medicine

## 2023-06-09 NOTE — Telephone Encounter (Signed)
Requested Prescriptions  Pending Prescriptions Disp Refills   simvastatin (ZOCOR) 10 MG tablet [Pharmacy Med Name: SIMVASTATIN 10 MG Tablet] 90 tablet 2    Sig: TAKE 1 TABLET AT BEDTIME     Cardiovascular:  Antilipid - Statins Failed - 06/08/2023  7:18 AM      Failed - Valid encounter within last 12 months    Recent Outpatient Visits           1 year ago Controlled type 2 diabetes mellitus with complication, without long-term current use of insulin (HCC)   [facility] Pickard, Priscille Heidelberg, MD   2 years ago Exacerbation of gout   [facility] Donita Brooks, MD   2 years ago Controlled type 2 diabetes mellitus with complication, without long-term current use of insulin (HCC)   [facility] Donita Brooks, MD   2 years ago Acute gout of right foot, unspecified cause   [facility] Pickard, Priscille Heidelberg, MD   2 years ago Tinnitus of both ears   [facility] Pickard, Priscille Heidelberg, MD       Future Appointments             In 1 month Allyson Sabal, Delton See, MD [facility]            Failed - Lipid Panel in normal range within the last 12 months    Cholesterol  Date Value Ref Range Status  03/18/2023 157 <200 mg/dL Final   LDL Cholesterol (Calc)  Date Value Ref Range Status  03/18/2023 86 mg/dL (calc) Final    Comment:    Reference range: <100 . Desirable range <100 mg/dL for primary prevention;   <70 mg/dL for patients with CHD or diabetic patients  with > or = 2 CHD risk factors. Marland Kitchen LDL-C is now calculated using the Martin-Hopkins  calculation, which is a validated novel method providing  better accuracy than the Friedewald equation in the  estimation of LDL-C.  Horald Pollen et al. Lenox Ahr. 1610;960(45): 2061-2068  (http://education.QuestDiagnostics.com/faq/FAQ164)    HDL  Date Value Ref Range Status  03/18/2023 52 > OR = 50 mg/dL Final   Triglycerides  Date Value Ref  Range Status  03/18/2023 97 <150 mg/dL Final         Passed - Patient is not pregnant

## 2023-06-14 ENCOUNTER — Other Ambulatory Visit: Payer: Self-pay | Admitting: Family Medicine

## 2023-06-14 DIAGNOSIS — I152 Hypertension secondary to endocrine disorders: Secondary | ICD-10-CM

## 2023-06-16 NOTE — Telephone Encounter (Signed)
Requested Prescriptions  Pending Prescriptions Disp Refills   amLODipine (NORVASC) 10 MG tablet [Pharmacy Med Name: AMLODIPINE BESYLATE 10 MG TAB] 90 tablet 0    Sig: TAKE 1 TABLET BY MOUTH EVERY DAY     Cardiovascular: Calcium Channel Blockers 2 Failed - 06/14/2023  8:45 PM      Failed - Last BP in normal range    BP Readings from Last 1 Encounters:  04/15/23 (!) 147/80         Failed - Valid encounter within last 6 months    Recent Outpatient Visits           1 year ago Controlled type 2 diabetes mellitus with complication, without long-term current use of insulin (HCC)   [facility] Pickard, Priscille Heidelberg, MD   2 years ago Exacerbation of gout   [facility] Donita Brooks, MD   2 years ago Controlled type 2 diabetes mellitus with complication, without long-term current use of insulin (HCC)   [facility] Donita Brooks, MD   2 years ago Acute gout of right foot, unspecified cause   [facility] Pickard, Priscille Heidelberg, MD   2 years ago Tinnitus of both ears   [facility] Pickard, Priscille Heidelberg, MD       Future Appointments             In 1 month Allyson Sabal Delton See, MD [facility] - Last Heart Rate in normal range    Pulse Readings from Last 1 Encounters:  04/15/23 79

## 2023-06-27 ENCOUNTER — Encounter: Payer: Self-pay | Admitting: Family Medicine

## 2023-07-29 ENCOUNTER — Ambulatory Visit: Payer: Medicare HMO | Attending: Cardiovascular Disease | Admitting: Cardiovascular Disease

## 2023-07-29 ENCOUNTER — Encounter: Payer: Self-pay | Admitting: Cardiovascular Disease

## 2023-07-29 ENCOUNTER — Telehealth: Payer: Self-pay

## 2023-07-29 VITALS — BP 144/72 | HR 78 | Ht 64.0 in | Wt 166.6 lb

## 2023-07-29 DIAGNOSIS — R0609 Other forms of dyspnea: Secondary | ICD-10-CM

## 2023-07-29 NOTE — Progress Notes (Signed)
Carla Wells returns today for follow-up of her outpatient test performed in the evaluation of dyspnea.  She is accompanied by her daughter today [name].  She did have a 2D echocardiogram that was essentially normal with only mildly elevated pulmonary pressures, no valvular abnormalities and normal RV size and function.  She did have a coronary calcium score which unfortunately has not been read yet.  She says her breathing is mildly improved as she dates the onset of her dyspnea from the time she had her pulmonary emboli.  She was on Eliquis after that according to her daughter.  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  She could have a chromic cardiopulmonary exercise test but at age 82 on not sure we will do anything differently.  I await the results of the coronary calcium score.  If these this is normal, I will see her back as needed.  Runell Gess, M.D., FACP, [facility], Earl Lagos [facility] [address] [place], [place]  [address]  [phone] 07/29/2023 4:31 PM

## 2023-07-29 NOTE — Telephone Encounter (Signed)
Called over to [facility] reading room regarding Calcium score that has not be read yet. Spoke with Vonna Kotyk who states that he will completed correctly and will fall into the queue to be read.

## 2023-07-29 NOTE — Patient Instructions (Signed)
Medication Instructions:  Your physician recommends that you continue on your current medications as directed. Please refer to the Current Medication list given to you today.  *If you need a refill on your cardiac medications before your next appointment, please call your pharmacy*   Follow-Up: At [facility], you and your health needs are our priority.  As part of our continuing mission to provide you with exceptional heart care, we have created designated Provider Care Teams.  These Care Teams include your primary Cardiologist (physician) and Advanced Practice Providers (APPs -  Physician Assistants and Nurse Practitioners) who all work together to provide you with the care you need, when you need it.  We recommend signing up for the patient portal called "MyChart".  Sign up information is provided on this After Visit Summary.  MyChart is used to connect with patients for Virtual Visits (Telemedicine).  Patients are able to view lab/test results, encounter notes, upcoming appointments, etc.  Non-urgent messages can be sent to your provider as well.   To learn more about what you can do with MyChart, go to [email or web address].    Your next appointment:   We will see you on an as needed basis.  Provider:   Jonathan Berry, MD  

## 2023-08-14 NOTE — Telephone Encounter (Signed)
Left message for pt's daughter, [name] to call back to discuss results.

## 2023-08-15 NOTE — Telephone Encounter (Signed)
Spoke with pt's daughter regarding elevated calcium score. Per Dr. Allyson Sabal would like to discuss next steps in person. Office visit scheduled for pt. Daughter verbalizes understanding.

## 2023-08-23 ENCOUNTER — Other Ambulatory Visit: Payer: Self-pay | Admitting: Family Medicine

## 2023-08-23 DIAGNOSIS — M109 Gout, unspecified: Secondary | ICD-10-CM

## 2023-08-25 NOTE — Telephone Encounter (Signed)
Requested Prescriptions  Pending Prescriptions Disp Refills   allopurinol (ZYLOPRIM) 100 MG tablet [Pharmacy Med Name: ALLOPURINOL 100 MG TABLET] 90 tablet 0    Sig: TAKE 1 TABLET BY MOUTH EVERY DAY     Endocrinology:  Gout Agents - allopurinol Failed - 08/23/2023  8:23 AM      Failed - Uric Acid in normal range and within 360 days    Uric Acid, Serum  Date Value Ref Range Status  01/31/2020 9.5 (H) 2.5 - 7.0 mg/dL Final    Comment:    Therapeutic target for gout patients: <6.0 mg/dL .          Failed - Cr in normal range and within 360 days    Creat  Date Value Ref Range Status  03/18/2023 1.09 (H) 0.60 - 0.95 mg/dL Final   Creatinine, Ser  Date Value Ref Range Status  05/08/2023 1.30 (H) 0.44 - 1.00 mg/dL Final   Creatinine, Urine  Date Value Ref Range Status  03/18/2023 199 20 - 275 mg/dL Final         Failed - Valid encounter within last 12 months    Recent Outpatient Visits           1 year ago Controlled type 2 diabetes mellitus with complication, without long-term current use of insulin (HCC)   [facility] Pickard, Priscille Heidelberg, MD   2 years ago Exacerbation of gout   [facility] Donita Brooks, MD   2 years ago Controlled type 2 diabetes mellitus with complication, without long-term current use of insulin (HCC)   [facility] Donita Brooks, MD   2 years ago Acute gout of right foot, unspecified cause   [facility] Pickard, Priscille Heidelberg, MD   2 years ago Tinnitus of both ears   [facility] Pickard, Priscille Heidelberg, MD       Future Appointments             In 2 weeks Runell Gess, MD [facility] at [facility]            Passed - CBC within normal limits and completed in the last 12 months    WBC  Date Value Ref Range Status  03/18/2023 4.7 3.8 - 10.8 Thousand/uL Final   RBC  Date Value Ref Range Status  03/18/2023 4.71 3.80 - 5.10 Million/uL Final    Hemoglobin  Date Value Ref Range Status  03/18/2023 12.6 11.7 - 15.5 g/dL Final   HCT  Date Value Ref Range Status  03/18/2023 39.1 35.0 - 45.0 % Final   MCHC  Date Value Ref Range Status  03/18/2023 32.2 32.0 - 36.0 g/dL Final   [facility]  Date Value Ref Range Status  03/18/2023 26.8 (L) 27.0 - 33.0 pg Final   MCV  Date Value Ref Range Status  03/18/2023 83.0 80.0 - 100.0 fL Final   No results found for: "PLTCOUNTKUC", "LABPLAT", "POCPLA" RDW  Date Value Ref Range Status  03/18/2023 14.2 11.0 - 15.0 % Final

## 2023-08-30 ENCOUNTER — Other Ambulatory Visit: Payer: Self-pay | Admitting: Family Medicine

## 2023-09-01 NOTE — Telephone Encounter (Signed)
Requested Prescriptions  Pending Prescriptions Disp Refills   omeprazole (PRILOSEC) 40 MG capsule [Pharmacy Med Name: Omeprazole Oral Capsule Delayed Release 40 MG] 90 capsule 0    Sig: TAKE 1 CAPSULE EVERY DAY     Gastroenterology: Proton Pump Inhibitors Failed - 08/30/2023  2:29 AM      Failed - Valid encounter within last 12 months    Recent Outpatient Visits           1 year ago Controlled type 2 diabetes mellitus with complication, without long-term current use of insulin (HCC)   [facility] Pickard, Priscille Heidelberg, MD   2 years ago Exacerbation of gout   [facility] Donita Brooks, MD   2 years ago Controlled type 2 diabetes mellitus with complication, without long-term current use of insulin (HCC)   [facility] Donita Brooks, MD   2 years ago Acute gout of right foot, unspecified cause   [facility] Pickard, Priscille Heidelberg, MD   2 years ago Tinnitus of both ears   [facility] Pickard, Priscille Heidelberg, MD       Future Appointments             In 1 week Allyson Sabal, Delton See, MD [facility] at [facility]

## 2023-09-10 ENCOUNTER — Ambulatory Visit: Payer: Medicare HMO | Attending: Cardiovascular Disease | Admitting: Cardiovascular Disease

## 2023-09-10 ENCOUNTER — Encounter: Payer: Self-pay | Admitting: Cardiovascular Disease

## 2023-09-10 VITALS — BP 134/62 | HR 91 | Ht 64.0 in | Wt 172.0 lb

## 2023-09-10 DIAGNOSIS — R931 Abnormal findings on diagnostic imaging of heart and coronary circulation: Secondary | ICD-10-CM

## 2023-09-10 MED ORDER — SIMVASTATIN 20 MG PO TABS: 20.0000 mg | ORAL_TABLET | Freq: Every day | ORAL | 3 refills | Status: DC

## 2023-09-10 NOTE — Progress Notes (Signed)
I saw Ms. Vansickle back in follow-up of her coronary calcium score which came back at 679 with calcium equally distributed in all 3 coronary arteries.  She specifically does not have chest pain but does have some dyspnea.  Her 2D echo was essentially normal except for mildly elevated pulmonary artery pressures.  Her LDL was 86 on simvastatin 10 mg a day, not at goal for secondary prevention.  We talked about doing a stress test but at this point, given her age and lack of chest pain I am inclined to treat her conservatively.  I will have her see an APP back in 6 months and I will see her back in a year.  Runell Gess, M.D., FACP, [facility], [facility]os [facility] [address] [place], [place]  [address]  [phone] 09/10/2023 4:07 PM

## 2023-09-10 NOTE — Patient Instructions (Signed)
Medication Instructions:  Increase Simvastatin 20mg  every day at bedtime *If you need a refill on your cardiac medications before your next appointment, please call your pharmacy*   Lab Work: none If you have labs (blood work) drawn today and your tests are completely normal, you will receive your results only by: MyChart Message (if you have MyChart) OR A paper copy in the mail If you have any lab test that is abnormal or we need to change your treatment, we will call you to review the results.   Testing/Procedures: none   Follow-Up: At [facility], you and your health needs are our priority.  As part of our continuing mission to provide you with exceptional heart care, we have created designated Provider Care Teams.  These Care Teams include your primary Cardiologist (physician) and Advanced Practice Providers (APPs -  Physician Assistants and Nurse Practitioners) who all work together to provide you with the care you need, when you need it.  We recommend signing up for the patient portal called "MyChart".  Sign up information is provided on this After Visit Summary.  MyChart is used to connect with patients for Virtual Visits (Telemedicine).  Patients are able to view lab/test results, encounter notes, upcoming appointments, etc.  Non-urgent messages can be sent to your provider as well.   To learn more about what you can do with MyChart, go to [email or web address].    Your next appointment:   6 month(s)  Provider:   Lisabeth Devoid, [place]  then 12 months Nanetta Batty, MD

## 2023-09-16 ENCOUNTER — Other Ambulatory Visit: Payer: Self-pay | Admitting: Family Medicine

## 2023-09-16 DIAGNOSIS — E1159 Type 2 diabetes mellitus with other circulatory complications: Secondary | ICD-10-CM

## 2023-09-16 NOTE — Telephone Encounter (Signed)
Requested Prescriptions  Pending Prescriptions Disp Refills   amLODipine (NORVASC) 10 MG tablet [Pharmacy Med Name: AMLODIPINE BESYLATE 10 MG TAB] 90 tablet 0    Sig: TAKE 1 TABLET BY MOUTH EVERY DAY     Cardiovascular: Calcium Channel Blockers 2 Failed - 09/16/2023  2:24 AM      Failed - Valid encounter within last 6 months    Recent Outpatient Visits           1 year ago Controlled type 2 diabetes mellitus with complication, without long-term current use of insulin (HCC)   [facility] Pickard, Priscille Heidelberg, MD   2 years ago Exacerbation of gout   [facility] Donita Brooks, MD   2 years ago Controlled type 2 diabetes mellitus with complication, without long-term current use of insulin (HCC)   [facility] Donita Brooks, MD   2 years ago Acute gout of right foot, unspecified cause   [facility] Pickard, Priscille Heidelberg, MD   2 years ago Tinnitus of both ears   [facility] Pickard, Priscille Heidelberg, MD              Passed - Last BP in normal range    BP Readings from Last 1 Encounters:  09/10/23 134/62         Passed - Last Heart Rate in normal range    Pulse Readings from Last 1 Encounters:  09/10/23 91

## 2023-11-13 ENCOUNTER — Other Ambulatory Visit: Payer: Self-pay | Admitting: Family Medicine

## 2023-11-27 ENCOUNTER — Other Ambulatory Visit: Payer: Self-pay | Admitting: Family Medicine

## 2023-11-27 DIAGNOSIS — M109 Gout, unspecified: Secondary | ICD-10-CM

## 2023-11-28 NOTE — Telephone Encounter (Signed)
Requested medication (s) are due for refill today: Yes  Requested medication (s) are on the active medication list: Yes  Last refill:  08/25/23  Future visit scheduled: No  Notes to clinic:  Protocol indicates OV needed.    Requested Prescriptions  Pending Prescriptions Disp Refills   allopurinol (ZYLOPRIM) 100 MG tablet [Pharmacy Med Name: ALLOPURINOL 100 MG TABLET] 90 tablet 0    Sig: TAKE 1 TABLET BY MOUTH EVERY DAY     Endocrinology:  Gout Agents - allopurinol Failed - 11/28/2023 10:59 AM      Failed - Uric Acid in normal range and within 360 days    Uric Acid, Serum  Date Value Ref Range Status  01/31/2020 9.5 (H) 2.5 - 7.0 mg/dL Final    Comment:    Therapeutic target for gout patients: <6.0 mg/dL .          Failed - Cr in normal range and within 360 days    Creat  Date Value Ref Range Status  03/18/2023 1.09 (H) 0.60 - 0.95 mg/dL Final   Creatinine, Ser  Date Value Ref Range Status  05/08/2023 1.30 (H) 0.44 - 1.00 mg/dL Final   Creatinine, Urine  Date Value Ref Range Status  03/18/2023 199 20 - 275 mg/dL Final         Failed - Valid encounter within last 12 months    Recent Outpatient Visits           1 year ago Controlled type 2 diabetes mellitus with complication, without long-term current use of insulin (HCC)   [facility] Pickard, Priscille Heidelberg, MD   2 years ago Exacerbation of gout   [facility] Donita Brooks, MD   2 years ago Controlled type 2 diabetes mellitus with complication, without long-term current use of insulin (HCC)   [facility] Donita Brooks, MD   2 years ago Acute gout of right foot, unspecified cause   [facility] Pickard, Priscille Heidelberg, MD   3 years ago Tinnitus of both ears   [facility] Pickard, Priscille Heidelberg, MD              Passed - CBC within normal limits and completed in the last 12 months    WBC  Date Value Ref Range Status  03/18/2023 4.7  3.8 - 10.8 Thousand/uL Final   RBC  Date Value Ref Range Status  03/18/2023 4.71 3.80 - 5.10 Million/uL Final   Hemoglobin  Date Value Ref Range Status  03/18/2023 12.6 11.7 - 15.5 g/dL Final   HCT  Date Value Ref Range Status  03/18/2023 39.1 35.0 - 45.0 % Final   MCHC  Date Value Ref Range Status  03/18/2023 32.2 32.0 - 36.0 g/dL Final   [facility]  Date Value Ref Range Status  03/18/2023 26.8 (L) 27.0 - 33.0 pg Final   MCV  Date Value Ref Range Status  03/18/2023 83.0 80.0 - 100.0 fL Final   No results found for: "PLTCOUNTKUC", "LABPLAT", "POCPLA" RDW  Date Value Ref Range Status  03/18/2023 14.2 11.0 - 15.0 % Final

## 2023-12-10 DIAGNOSIS — H401131 Primary open-angle glaucoma, bilateral, mild stage: Secondary | ICD-10-CM | POA: Diagnosis not present

## 2023-12-10 DIAGNOSIS — E119 Type 2 diabetes mellitus without complications: Secondary | ICD-10-CM | POA: Diagnosis not present

## 2023-12-10 DIAGNOSIS — H1045 Other chronic allergic conjunctivitis: Secondary | ICD-10-CM | POA: Diagnosis not present

## 2023-12-10 DIAGNOSIS — H43823 Vitreomacular adhesion, bilateral: Secondary | ICD-10-CM | POA: Diagnosis not present

## 2023-12-19 ENCOUNTER — Other Ambulatory Visit: Payer: Self-pay | Admitting: Family Medicine

## 2023-12-19 DIAGNOSIS — I152 Hypertension secondary to endocrine disorders: Secondary | ICD-10-CM

## 2024-01-14 ENCOUNTER — Other Ambulatory Visit: Payer: Self-pay | Admitting: Family Medicine

## 2024-02-09 DIAGNOSIS — S93402A Sprain of unspecified ligament of left ankle, initial encounter: Secondary | ICD-10-CM | POA: Diagnosis not present

## 2024-02-09 DIAGNOSIS — M25572 Pain in left ankle and joints of left foot: Secondary | ICD-10-CM | POA: Diagnosis not present

## 2024-03-13 ENCOUNTER — Other Ambulatory Visit: Payer: Self-pay | Admitting: Family Medicine

## 2024-03-15 NOTE — Telephone Encounter (Signed)
 Courtesy refill given, please keep scheduled appointment to receive additional refills.   Requested Prescriptions  Pending Prescriptions Disp Refills   metFORMIN (GLUCOPHAGE) 500 MG tablet [Pharmacy Med Name: metFORMIN HCl Oral Tablet 500 MG] 120 tablet 0    Sig: TAKE 2 TABLETS TWICE DAILY WITH MEALS     Endocrinology:  Diabetes - Biguanides Failed - 03/15/2024 11:48 AM      Failed - Cr in normal range and within 360 days    Creat  Date Value Ref Range Status  03/18/2023 1.09 (H) 0.60 - 0.95 mg/dL Final   Creatinine, Ser  Date Value Ref Range Status  05/08/2023 1.30 (H) 0.44 - 1.00 mg/dL Final   Creatinine, Urine  Date Value Ref Range Status  03/18/2023 199 20 - 275 mg/dL Final         Failed - HBA1C is between 0 and 7.9 and within 180 days    Hgb A1c MFr Bld  Date Value Ref Range Status  03/18/2023 6.2 (H) <5.7 % of total Hgb Final    Comment:    For someone without known diabetes, a hemoglobin  A1c value between 5.7% and 6.4% is consistent with prediabetes and should be confirmed with a  follow-up test. . For someone with known diabetes, a value <7% indicates that their diabetes is well controlled. A1c targets should be individualized based on duration of diabetes, age, comorbid conditions, and other considerations. . This assay result is consistent with an increased risk of diabetes. . Currently, no consensus exists regarding use of hemoglobin A1c for diagnosis of diabetes for children. .          Failed - eGFR in normal range and within 360 days    GFR, Est African American  Date Value Ref Range Status  02/06/2021 51 (L) > OR = 60 mL/min/1.43m2 Final   GFR, Est Non African American  Date Value Ref Range Status  02/06/2021 44 (L) > OR = 60 mL/min/1.34m2 Final   eGFR  Date Value Ref Range Status  03/18/2023 51 (L) > OR = 60 mL/min/1.30m2 Final         Failed - B12 Level in normal range and within 720 days    No results found for: "VITAMINB12"        Failed - Valid encounter within last 6 months    Recent Outpatient Visits           12 months ago Encounter for Medicare annual wellness exam   [place] [facility] Austine Lefort, MD   1 year ago Controlled type 2 diabetes mellitus with complication, without long-term current use of insulin [facility])   [place] [facility] Austine Lefort, MD              Failed - CBC within normal limits and completed in the last 12 months    WBC  Date Value Ref Range Status  03/18/2023 4.7 3.8 - 10.8 Thousand/uL Final   RBC  Date Value Ref Range Status  03/18/2023 4.71 3.80 - 5.10 Million/uL Final   Hemoglobin  Date Value Ref Range Status  03/18/2023 12.6 11.7 - 15.5 g/dL Final   HCT  Date Value Ref Range Status  03/18/2023 39.1 35.0 - 45.0 % Final   MCHC  Date Value Ref Range Status  03/18/2023 32.2 32.0 - 36.0 g/dL Final   [facility]  Date Value Ref Range Status  03/18/2023 26.8 (L) 27.0 - 33.0 pg Final   MCV  Date Value Ref Range Status  03/18/2023 83.0 80.0 - 100.0 fL Final   No results found for: "PLTCOUNTKUC", "LABPLAT", "POCPLA" RDW  Date Value Ref Range Status  03/18/2023 14.2 11.0 - 15.0 % Final

## 2024-03-17 ENCOUNTER — Other Ambulatory Visit: Payer: Self-pay | Admitting: Family Medicine

## 2024-03-17 DIAGNOSIS — M109 Gout, unspecified: Secondary | ICD-10-CM

## 2024-03-18 NOTE — Telephone Encounter (Signed)
 Requested medications are due for refill today.  yes  Requested medications are on the active medications list.  yes  Last refill. 08/25/2023 #90 0 rf  Future visit scheduled.   yes  Notes to clinic.  Labs are expired.     Requested Prescriptions  Pending Prescriptions Disp Refills   allopurinol (ZYLOPRIM) 100 MG tablet [Pharmacy Med Name: ALLOPURINOL 100 MG TABLET] 90 tablet 0    Sig: TAKE 1 TABLET BY MOUTH EVERY DAY     Endocrinology:  Gout Agents - allopurinol Failed - 03/18/2024  1:46 PM      Failed - Uric Acid in normal range and within 360 days    Uric Acid, Serum  Date Value Ref Range Status  01/31/2020 9.5 (H) 2.5 - 7.0 mg/dL Final    Comment:    Therapeutic target for gout patients: <6.0 mg/dL .          Failed - Cr in normal range and within 360 days    Creat  Date Value Ref Range Status  03/18/2023 1.09 (H) 0.60 - 0.95 mg/dL Final   Creatinine, Ser  Date Value Ref Range Status  05/08/2023 1.30 (H) 0.44 - 1.00 mg/dL Final   Creatinine, Urine  Date Value Ref Range Status  03/18/2023 199 20 - 275 mg/dL Final         Failed - CBC within normal limits and completed in the last 12 months    WBC  Date Value Ref Range Status  03/18/2023 4.7 3.8 - 10.8 Thousand/uL Final   RBC  Date Value Ref Range Status  03/18/2023 4.71 3.80 - 5.10 Million/uL Final   Hemoglobin  Date Value Ref Range Status  03/18/2023 12.6 11.7 - 15.5 g/dL Final   HCT  Date Value Ref Range Status  03/18/2023 39.1 35.0 - 45.0 % Final   MCHC  Date Value Ref Range Status  03/18/2023 32.2 32.0 - 36.0 g/dL Final   [facility]  Date Value Ref Range Status  03/18/2023 26.8 (L) 27.0 - 33.0 pg Final   MCV  Date Value Ref Range Status  03/18/2023 83.0 80.0 - 100.0 fL Final   No results found for: "PLTCOUNTKUC", "LABPLAT", "POCPLA" RDW  Date Value Ref Range Status  03/18/2023 14.2 11.0 - 15.0 % Final         Passed - Valid encounter within last 12 months    Recent Outpatient Visits            1 year ago Encounter for Medicare annual wellness exam   [place] [facility] Austine Lefort, MD   1 year ago Controlled type 2 diabetes mellitus with complication, without long-term current use of insulin [facility])   [facility] Pickard, Cisco Crest, MD

## 2024-03-31 DIAGNOSIS — H903 Sensorineural hearing loss, bilateral: Secondary | ICD-10-CM | POA: Diagnosis not present

## 2024-03-31 DIAGNOSIS — H6122 Impacted cerumen, left ear: Secondary | ICD-10-CM | POA: Diagnosis not present

## 2024-03-31 DIAGNOSIS — Z9622 Myringotomy tube(s) status: Secondary | ICD-10-CM | POA: Diagnosis not present

## 2024-04-05 ENCOUNTER — Other Ambulatory Visit: Payer: Self-pay | Admitting: Family Medicine

## 2024-04-06 NOTE — Telephone Encounter (Signed)
 Requested medications are due for refill today.  yes  Requested medications are on the active medications list.  yes  Last refill. 03/15/2024 #120 0 rf  Future visit scheduled.   yes  Notes to clinic.  Pt already given a courtesy refill. Labs are expired.    Requested Prescriptions  Pending Prescriptions Disp Refills   metFORMIN  (GLUCOPHAGE ) 500 MG tablet [Pharmacy Med Name: metFORMIN  HCl Oral Tablet 500 MG] 120 tablet 11    Sig: TAKE 2 TABLETS TWICE DAILY WITH MEALS (PLEASE KEEP SCHEDULED APPOINTMENT FOR FURTHER REFILLS)     Endocrinology:  Diabetes - Biguanides Failed - 04/06/2024  3:42 PM      Failed - Cr in normal range and within 360 days    Creat  Date Value Ref Range Status  03/18/2023 1.09 (H) 0.60 - 0.95 mg/dL Final   Creatinine, Ser  Date Value Ref Range Status  05/08/2023 1.30 (H) 0.44 - 1.00 mg/dL Final   Creatinine, Urine  Date Value Ref Range Status  03/18/2023 199 20 - 275 mg/dL Final         Failed - HBA1C is between 0 and 7.9 and within 180 days    Hgb A1c MFr Bld  Date Value Ref Range Status  03/18/2023 6.2 (H) <5.7 % of total Hgb Final    Comment:    For someone without known diabetes, a hemoglobin  A1c value between 5.7% and 6.4% is consistent with prediabetes and should be confirmed with a  follow-up test. . For someone with known diabetes, a value <7% indicates that their diabetes is well controlled. A1c targets should be individualized based on duration of diabetes, age, comorbid conditions, and other considerations. . This assay result is consistent with an increased risk of diabetes. . Currently, no consensus exists regarding use of hemoglobin A1c for diagnosis of diabetes for children. .          Failed - eGFR in normal range and within 360 days    GFR, Est African American  Date Value Ref Range Status  02/06/2021 51 (L) > OR = 60 mL/min/1.6m2 Final   GFR, Est Non African American  Date Value Ref Range Status  02/06/2021 44 (L)  > OR = 60 mL/min/1.60m2 Final   eGFR  Date Value Ref Range Status  03/18/2023 51 (L) > OR = 60 mL/min/1.61m2 Final         Failed - B12 Level in normal range and within 720 days    No results found for: "VITAMINB12"       Failed - Valid encounter within last 6 months    Recent Outpatient Visits           1 year ago Encounter for Medicare annual wellness exam   [place] [facility] Austine Lefort, MD   1 year ago Controlled type 2 diabetes mellitus with complication, without long-term current use of insulin  [facility])   [place] [facility] Austine Lefort, MD              Failed - CBC within normal limits and completed in the last 12 months    WBC  Date Value Ref Range Status  03/18/2023 4.7 3.8 - 10.8 Thousand/uL Final   RBC  Date Value Ref Range Status  03/18/2023 4.71 3.80 - 5.10 Million/uL Final   Hemoglobin  Date Value Ref Range Status  03/18/2023 12.6 11.7 - 15.5 g/dL Final   HCT  Date Value Ref Range Status  03/18/2023 39.1 35.0 - 45.0 % Final   MCHC  Date Value Ref Range Status  03/18/2023 32.2 32.0 - 36.0 g/dL Final   [facility]  Date Value Ref Range Status  03/18/2023 26.8 (L) 27.0 - 33.0 pg Final   MCV  Date Value Ref Range Status  03/18/2023 83.0 80.0 - 100.0 fL Final   No results found for: "PLTCOUNTKUC", "LABPLAT", "POCPLA" RDW  Date Value Ref Range Status  03/18/2023 14.2 11.0 - 15.0 % Final

## 2024-04-20 DIAGNOSIS — H903 Sensorineural hearing loss, bilateral: Secondary | ICD-10-CM | POA: Diagnosis not present

## 2024-04-20 DIAGNOSIS — Z9622 Myringotomy tube(s) status: Secondary | ICD-10-CM | POA: Diagnosis not present

## 2024-04-20 DIAGNOSIS — H6122 Impacted cerumen, left ear: Secondary | ICD-10-CM | POA: Diagnosis not present

## 2024-04-28 ENCOUNTER — Other Ambulatory Visit: Payer: Self-pay | Admitting: Family Medicine

## 2024-05-27 ENCOUNTER — Ambulatory Visit: Payer: Medicare HMO | Admitting: *Deleted

## 2024-05-27 VITALS — Ht 64.0 in | Wt 172.0 lb

## 2024-05-27 DIAGNOSIS — Z0000 Encounter for general adult medical examination without abnormal findings: Secondary | ICD-10-CM

## 2024-05-27 NOTE — Patient Instructions (Signed)
 Carla Wells , Thank you for taking time to come for your Medicare Wellness Visit. I appreciate your ongoing commitment to your health goals. Please review the following plan we discussed and let me know if I can assist you in the future.   Screening recommendations/referrals: Colonoscopy: no longer required Mammogram: up to date Bone Density: up to date Recommended yearly ophthalmology/optometry visit for glaucoma screening and checkup Recommended yearly dental visit for hygiene and checkup  Vaccinations: Influenza vaccine: up to date Pneumococcal vaccine: up to date Tdap vaccine: Educaiton provided Shingles vaccine: Education provided     Preventive Care 65 Years and Older, Female Preventive care refers to lifestyle choices and visits with your health care provider that can promote health and wellness. What does preventive care include? A yearly physical exam. This is also called an annual well check. Dental exams once or twice a year. Routine eye exams. Ask your health care provider how often you should have your eyes checked. Personal lifestyle choices, including: Daily care of your teeth and gums. Regular physical activity. Eating a healthy diet. Avoiding tobacco and drug use. Limiting alcohol use. Practicing safe sex. Taking low-dose aspirin every day. Taking vitamin and mineral supplements as recommended by your health care provider. What happens during an annual well check? The services and screenings done by your health care provider during your annual well check will depend on your age, overall health, lifestyle risk factors, and family history of disease. Counseling  Your health care provider may ask you questions about your: Alcohol use. Tobacco use. Drug use. Emotional well-being. Home and relationship well-being. Sexual activity. Eating habits. History of falls. Memory and ability to understand (cognition). Work and work Astronomer. Reproductive  health. Screening  You may have the following tests or measurements: Height, weight, and BMI. Blood pressure. Lipid and cholesterol levels. These may be checked every 5 years, or more frequently if you are over 86 years old. Skin check. Lung cancer screening. You may have this screening every year starting at age 55 if you have a 30-pack-year history of smoking and currently smoke or have quit within the past 15 years. Fecal occult blood test (FOBT) of the stool. You may have this test every year starting at age 43. Flexible sigmoidoscopy or colonoscopy. You may have a sigmoidoscopy every 5 years or a colonoscopy every 10 years starting at age 42. Hepatitis C blood test. Hepatitis B blood test. Sexually transmitted disease (STD) testing. Diabetes screening. This is done by checking your blood sugar (glucose) after you have not eaten for a while (fasting). You may have this done every 1-3 years. Bone density scan. This is done to screen for osteoporosis. You may have this done starting at age 10. Mammogram. This may be done every 1-2 years. Talk to your health care provider about how often you should have regular mammograms. Talk with your health care provider about your test results, treatment options, and if necessary, the need for more tests. Vaccines  Your health care provider may recommend certain vaccines, such as: Influenza vaccine. This is recommended every year. Tetanus, diphtheria, and acellular pertussis (Tdap, Td) vaccine. You may need a Td booster every 10 years. Zoster vaccine. You may need this after age 40. Pneumococcal 13-valent conjugate (PCV13) vaccine. One dose is recommended after age 48. Pneumococcal polysaccharide (PPSV23) vaccine. One dose is recommended after age 23. Talk to your health care provider about which screenings and vaccines you need and how often you need them. This information is not  intended to replace advice given to you by your health care provider.  Make sure you discuss any questions you have with your health care provider. Document Released: 12/15/2015 Document Revised: 08/07/2016 Document Reviewed: 09/19/2015 Elsevier Interactive Patient Education  2017 [employer].  Fall Prevention in the Home Falls can cause injuries. They can happen to people of all ages. There are many things you can do to make your home safe and to help prevent falls. What can I do on the outside of my home? Regularly fix the edges of walkways and driveways and fix any cracks. Remove anything that might make you trip as you walk through a door, such as a raised step or threshold. Trim any bushes or trees on the path to your home. Use bright outdoor lighting. Clear any walking paths of anything that might make someone trip, such as rocks or tools. Regularly check to see if handrails are loose or broken. Make sure that both sides of any steps have handrails. Any raised decks and porches should have guardrails on the edges. Have any leaves, snow, or ice cleared regularly. Use sand or salt on walking paths during winter. Clean up any spills in your garage right away. This includes oil or grease spills. What can I do in the bathroom? Use night lights. Install grab bars by the toilet and in the tub and shower. Do not use towel bars as grab bars. Use non-skid mats or decals in the tub or shower. If you need to sit down in the shower, use a plastic, non-slip stool. Keep the floor dry. Clean up any water that spills on the floor as soon as it happens. Remove soap buildup in the tub or shower regularly. Attach bath mats securely with double-sided non-slip rug tape. Do not have throw rugs and other things on the floor that can make you trip. What can I do in the bedroom? Use night lights. Make sure that you have a light by your bed that is easy to reach. Do not use any sheets or blankets that are too big for your bed. They should not hang down onto the floor. Have a  firm chair that has side arms. You can use this for support while you get dressed. Do not have throw rugs and other things on the floor that can make you trip. What can I do in the kitchen? Clean up any spills right away. Avoid walking on wet floors. Keep items that you use a lot in easy-to-reach places. If you need to reach something above you, use a strong step stool that has a grab bar. Keep electrical cords out of the way. Do not use floor polish or wax that makes floors slippery. If you must use wax, use non-skid floor wax. Do not have throw rugs and other things on the floor that can make you trip. What can I do with my stairs? Do not leave any items on the stairs. Make sure that there are handrails on both sides of the stairs and use them. Fix handrails that are broken or loose. Make sure that handrails are as long as the stairways. Check any carpeting to make sure that it is firmly attached to the stairs. Fix any carpet that is loose or worn. Avoid having throw rugs at the top or bottom of the stairs. If you do have throw rugs, attach them to the floor with carpet tape. Make sure that you have a light switch at the top of the stairs  and the bottom of the stairs. If you do not have them, ask someone to add them for you. What else can I do to help prevent falls? Wear shoes that: Do not have high heels. Have rubber bottoms. Are comfortable and fit you well. Are closed at the toe. Do not wear sandals. If you use a stepladder: Make sure that it is fully opened. Do not climb a closed stepladder. Make sure that both sides of the stepladder are locked into place. Ask someone to hold it for you, if possible. Clearly mark and make sure that you can see: Any grab bars or handrails. First and last steps. Where the edge of each step is. Use tools that help you move around (mobility aids) if they are needed. These include: Canes. Walkers. Scooters. Crutches. Turn on the lights when you  go into a dark area. Replace any light bulbs as soon as they burn out. Set up your furniture so you have a clear path. Avoid moving your furniture around. If any of your floors are uneven, fix them. If there are any pets around you, be aware of where they are. Review your medicines with your doctor. Some medicines can make you feel dizzy. This can increase your chance of falling. Ask your doctor what other things that you can do to help prevent falls. This information is not intended to replace advice given to you by your health care provider. Make sure you discuss any questions you have with your health care provider. Document Released: 09/14/2009 Document Revised: 04/25/2016 Document Reviewed: 12/23/2014 Elsevier Interactive Patient Education  2017 [employer].

## 2024-05-27 NOTE — Progress Notes (Signed)
 Subjective:   Carla Wells is a 83 y.o. female who presents for Medicare Annual (Subsequent) preventive examination.  Visit Complete: Virtual I connected with  Carla Wells on 05/27/24 by a audio enabled telemedicine application and verified that I am speaking with the correct person using two identifiers.  Patient Location: Home  Provider Location: Home Office  I discussed the limitations of evaluation and management by telemedicine. The patient expressed understanding and agreed to proceed.  Vital Signs: Because this visit was a virtual/telehealth visit, some criteria may be missing or patient reported. Any vitals not documented were not able to be obtained and vitals that have been documented are patient reported.   Cardiac Risk Factors include: advanced age (>63men, >40 women);diabetes mellitus;hypertension;obesity (BMI >30kg/m2)     Objective:    Today's Vitals   05/27/24 0818  Weight: 172 lb (78 kg)  Height: 5' 4 (1.626 m)   Body mass index is 29.52 kg/m.     05/27/2024    8:25 AM 05/22/2023    9:17 AM 05/24/2022   10:05 AM 11/12/2021   10:25 AM 05/18/2021    9:55 AM 11/08/2020    9:37 AM 09/07/2020   11:38 AM  Advanced Directives  Does Patient Have a Medical Advance Directive? No No Yes Yes Yes Yes Yes  Type of Surveyor, minerals;Living will Healthcare Power of Attorney Living will Healthcare Power of [employer] Power of Attorney  Does patient want to make changes to medical advance directive?     No - Patient declined No - Patient declined No - Patient declined  Copy of Healthcare Power of Attorney in Chart?   No - copy requested    No - copy requested  Would patient like information on creating a medical advance directive? No - Patient declined Yes (MAU/Ambulatory/Procedural Areas - Information given)         Current Medications (verified) Outpatient Encounter Medications as of 05/27/2024  Medication Sig   allopurinol   (ZYLOPRIM ) 100 MG tablet TAKE 1 TABLET BY MOUTH EVERY DAY   amLODipine  (NORVASC ) 10 MG tablet TAKE 1 TABLET BY MOUTH EVERY DAY   blood glucose meter kit and supplies KIT Dispense based on patient and insurance preference. Use up to four times daily as directed. (FOR ICD-9 250.00, 250.01).   Calcium Citrate-Vitamin D (CALCIUM + D PO) Take 1 tablet by mouth daily.   Cholecalciferol  (VITAMIN D3 PO) Take 1 tablet by mouth daily.   fluticasone (FLONASE) 50 MCG/ACT nasal spray Place 2 sprays into both nostrils daily.   Insulin  Pen Needle 32G X 4 MM MISC 1 Container by Does not apply route in the morning and at bedtime.   latanoprost (XALATAN) 0.005 % ophthalmic solution Place 1 drop into both eyes at bedtime.   losartan -hydrochlorothiazide (HYZAAR) 50-12.5 MG tablet TAKE 1 TABLET EVERY DAY   metFORMIN  (GLUCOPHAGE ) 500 MG tablet TAKE 2 TABLETS TWICE DAILY WITH MEALS (PLEASE KEEP SCHEDULED APPOINTMENT FOR FURTHER REFILLS)   omeprazole  (PRILOSEC) 40 MG capsule TAKE 1 CAPSULE EVERY DAY   simvastatin  (ZOCOR ) 20 MG tablet Take 1 tablet (20 mg total) by mouth at bedtime.   albuterol  (VENTOLIN  HFA) 108 (90 Base) MCG/ACT inhaler Inhale 2 puffs into the lungs every 6 (six) hours as needed for wheezing or shortness of breath. (Patient not taking: Reported on 05/27/2024)   Colchicine  (MITIGARE ) 0.6 MG CAPS Take one tablet by mouth daily (Patient not taking: Reported on 05/27/2024)   simvastatin  (ZOCOR ) 10 MG tablet  TAKE 1 TABLET AT BEDTIME (Patient not taking: Reported on 05/27/2024)   No facility-administered encounter medications on file as of 05/27/2024.    Allergies (verified) Penicillins   History: Past Medical History:  Diagnosis Date   Abnormal pap    Cataract    Colon polyps    Diabetes mellitus without complication (HCC)    Hyperlipidemia    Hypertension    Murmur    History reviewed. No pertinent surgical history. Family History  Problem Relation Age of Onset   Stroke Mother    Kidney  failure Father    Diabetes Brother    Cancer Brother        Lung - Smoker   Lung cancer Brother    Cervical cancer Sister    Cancer Sister    Cancer Sister        ?Cervical CA   Social History   Socioeconomic History   Marital status: Single    Spouse name: Not on file   Number of children: 5   Years of education: Not on file   Highest education level: Not on file  Occupational History   Not on file  Tobacco Use   Smoking status: Never   Smokeless tobacco: Former    Types: Snuff  Substance and Sexual Activity   Alcohol use: No   Drug use: No   Sexual activity: Not Currently  Other Topics Concern   Not on file  Social History Narrative   5 Children   11 grandchildren   8 great grandchildren.   Sees family often, most live locally.   Keeps great granddaughter 2-3 days per week.   Social Drivers of Corporate investment banker Strain: Low Risk  (05/27/2024)   Overall Financial Resource Strain (CARDIA)    Difficulty of Paying Living Expenses: Not hard at all  Food Insecurity: No Food Insecurity (05/27/2024)   Hunger Vital Sign    Worried About Running Out of Food in the Last Year: Never true    Ran Out of Food in the Last Year: Never true  Transportation Needs: No Transportation Needs (05/27/2024)   PRAPARE - Administrator, Civil Service (Medical): No    Lack of Transportation (Non-Medical): No  Physical Activity: Inactive (05/27/2024)   Exercise Vital Sign    Days of Exercise per Week: 0 days    Minutes of Exercise per Session: 0 min  Stress: No Stress Concern Present (05/27/2024)   [employer] of Occupational Health - Occupational Stress Questionnaire    Feeling of Stress: Not at all  Social Connections: Moderately Integrated (05/27/2024)   Social Connection and Isolation Panel    Frequency of Communication with Friends and Family: Twice a week    Frequency of Social Gatherings with Friends and Family: Once a week    Attends Religious Services:  More than 4 times per year    Active Member of [employer] or Organizations: Yes    Attends Banker Meetings: More than 4 times per year    Marital Status: Widowed    Tobacco Counseling Counseling given: Not Answered   Clinical Intake:  Pre-visit preparation completed: Yes  Pain : No/denies pain     Diabetes: Yes CBG done?: No Did pt. bring in CBG monitor from home?: No  How often do you need to have someone help you when you read instructions, pamphlets, or other written materials from your doctor or pharmacy?: 1 - Never  Interpreter Needed?: No  Information entered by ::  Mliss Graff LPN   Activities of Daily Living    05/27/2024    8:26 AM  In your present state of health, do you have any difficulty performing the following activities:  Hearing? 1  Vision? 0  Difficulty concentrating or making decisions? 0  Walking or climbing stairs? 0  Dressing or bathing? 0  Doing errands, shopping? 0  Preparing Food and eating ? N  Using the Toilet? N  In the past six months, have you accidently leaked urine? N  Do you have problems with loss of bowel control? N  Managing your Medications? N  Managing your Finances? N  Housekeeping or managing your Housekeeping? N    Patient Care Team: Duanne Butler DASEN, MD as PCP - General (Family Medicine) Court Dorn PARAS, MD as PCP - Cardiology (Cardiology)  Indicate any recent Medical Services you may have received from other than [facility] providers in the past year (date may be approximate).     Assessment:   This is a routine wellness examination for Carla Wells.  Hearing/Vision screen Hearing Screening - Comments:: Bilateral hearing aids Vision Screening - Comments:: Up to date Unsure of name   Goals Addressed             This Visit's Progress    Patient Stated       Stay independent       Depression Screen    05/27/2024    8:27 AM 05/22/2023    9:16 AM 03/18/2023   10:02 AM 05/24/2022    9:59 AM 01/30/2022    10:52 AM 12/17/2021    9:20 AM 11/12/2021   10:24 AM  PHQ 2/9 Scores  PHQ - 2 Score 0 0 0 0 0 0 0  PHQ- 9 Score 1     0     Fall Risk    05/27/2024    8:18 AM 05/22/2023    9:17 AM 03/18/2023   10:02 AM 05/24/2022   10:05 AM 01/30/2022   10:52 AM  Fall Risk   Falls in the past year? 1 0 0 0 0  Number falls in past yr: 0 0 0 0   Injury with Fall? 0 0 0 0   Risk for fall due to :  No Fall Risks No Fall Risks No Fall Risks   Follow up Falls evaluation completed;Education provided;Falls prevention discussed Falls prevention discussed;Education provided;Falls evaluation completed Falls prevention discussed Falls prevention discussed       Data saved with a previous flowsheet row definition    MEDICARE RISK AT HOME: Medicare Risk at Home Any stairs in or around the home?: No If so, are there any without handrails?: No Home free of loose throw rugs in walkways, pet beds, electrical cords, etc?: Yes Adequate lighting in your home to reduce risk of falls?: Yes Life alert?: No Use of a cane, walker or w/c?: No Grab bars in the bathroom?: No Shower chair or bench in shower?: No Elevated toilet seat or a handicapped toilet?: No  TIMED UP AND GO:  Was the test performed?  No    Cognitive Function:        05/27/2024    8:22 AM 05/22/2023    9:17 AM 05/24/2022   10:07 AM  6CIT Screen  What Year? 4 points 0 points 0 points  What month? 0 points 0 points 0 points  What time? 0 points 0 points 0 points  Count back from 20 0 points 0 points 0 points  Months in  reverse 2 points 2 points 4 points  Repeat phrase 2 points 4 points 4 points  Total Score 8 points 6 points 8 points    Immunizations Immunization History  Administered Date(s) Administered   Fluad Quad(high Dose 65+) 08/27/2019, 09/11/2020   Influenza Split 11/06/2012   Influenza, High Dose Seasonal PF 09/09/2018, 09/05/2022   Influenza,inj,Quad PF,6+ Mos 09/16/2013, 09/06/2014, 09/07/2015, 09/26/2016, 09/11/2017    Influenza-Unspecified 08/31/2021   PFIZER(Purple Top)SARS-COV-2 Vaccination 01/11/2020, 02/05/2020, 03/13/2021   Pfizer Covid-19 Vaccine Bivalent Booster 57yrs & up 08/31/2021   Pneumococcal Conjugate-13 11/04/2013   Pneumococcal Polysaccharide-23 10/03/2010   Zoster Recombinant(Shingrix) 05/30/2022, 09/10/2022    TDAP status: Due, Education has been provided regarding the importance of this vaccine. Advised may receive this vaccine at local pharmacy or Health Dept. Aware to provide a copy of the vaccination record if obtained from local pharmacy or Health Dept. Verbalized acceptance and understanding.  Flu Vaccine status: Up to date  Pneumococcal vaccine status: Up to date  Covid-19 vaccine status: Information provided on how to obtain vaccines.   Qualifies for Shingles Vaccine? Yes   Zostavax completed No   Shingrix Completed?: Yes  Screening Tests Health Maintenance  Topic Date Due   Diabetic kidney evaluation - Urine ACR  Never done   DTaP/Tdap/Td (1 - Tdap) Never done   FOOT EXAM  12/17/2022   HEMOGLOBIN A1C  09/17/2023   Diabetic kidney evaluation - eGFR measurement  03/17/2024   OPHTHALMOLOGY EXAM  04/20/2024   MAMMOGRAM  06/05/2024   INFLUENZA VACCINE  07/02/2024   Medicare Annual Wellness (AWV)  05/27/2025   Pneumococcal Vaccine: 50+ Years  Completed   DEXA SCAN  Completed   Zoster Vaccines- Shingrix  Completed   Hepatitis B Vaccines  Aged Out   HPV VACCINES  Aged Out   Meningococcal B Vaccine  Aged Out   COVID-19 Vaccine  Discontinued    Health Maintenance  Health Maintenance Due  Topic Date Due   Diabetic kidney evaluation - Urine ACR  Never done   DTaP/Tdap/Td (1 - Tdap) Never done   FOOT EXAM  12/17/2022   HEMOGLOBIN A1C  09/17/2023   Diabetic kidney evaluation - eGFR measurement  03/17/2024   OPHTHALMOLOGY EXAM  04/20/2024    Colorectal cancer screening: No longer required.   Mammogram status: No longer required due to age.  Bone Density status:  Completed 2022. Results reflect: Bone density results: NORMAL. Repeat every   years.  Lung Cancer Screening: (Low Dose CT Chest recommended if Age 71-80 years, 20 pack-year currently smoking OR have quit w/in 15years.) does not qualify.   Lung Cancer Screening Referral:   Additional Screening:  Hepatitis C Screening  never done  Vision Screening: Recommended annual ophthalmology exams for early detection of glaucoma and other disorders of the eye. Is the patient up to date with their annual eye exam?  Yes  Who is the provider or what is the name of the office in which the patient attends annual eye exams? Unsure of name   Education provided  unable to request records If pt is not established with a provider, would they like to be referred to a provider to establish care? No .   Dental Screening: Recommended annual dental exams for proper oral hygiene  Nutrition Risk Assessment:  Has the patient had any N/V/D within the last 2 months?  No  Does the patient have any non-healing wounds?  No  Has the patient had any unintentional weight loss or weight gain?  No   Diabetes:  Is the patient diabetic?  Yes  If diabetic, was a CBG obtained today?  No  Did the patient bring in their glucometer from home?  No  How often do you monitor your CBG's? 2 x a day.   Financial Strains and Diabetes Management:  Are you having any financial strains with the device, your supplies or your medication? No .  Does the patient want to be seen by Chronic Care Management for management of their diabetes?  No  Would the patient like to be referred to a Nutritionist or for Diabetic Management?  No   Diabetic Exams:  Diabetic Eye Exam:  Pt has been advised about the importance in completing this exam. A referral has been placed today.   Diabetic Foot Exam:  Pt has been advised about the importance in completing this exam. P.    Community Resource Referral / Chronic Care Management: CRR required this  visit?  No   CCM required this visit?  No     Plan:     I have personally reviewed and noted the following in the patient's chart:   Medical and social history Use of alcohol, tobacco or illicit drugs  Current medications and supplements including opioid prescriptions. Patient is not currently taking opioid prescriptions. Functional ability and status Nutritional status Physical activity Advanced directives List of other physicians Hospitalizations, surgeries, and ER visits in previous 12 months Vitals Screenings to include cognitive, depression, and falls Referrals and appointments  In addition, I have reviewed and discussed with patient certain preventive protocols, quality metrics, and best practice recommendations. A written personalized care plan for preventive services as well as general preventive health recommendations were provided to patient.     Mliss Graff, LPN   [id number]   After Visit Summary: (MyChart) Due to this being a telephonic visit, the after visit summary with patients personalized plan was offered to patient via MyChart   Nurse Notes:

## 2024-06-08 DIAGNOSIS — H401131 Primary open-angle glaucoma, bilateral, mild stage: Secondary | ICD-10-CM | POA: Diagnosis not present

## 2024-06-11 DIAGNOSIS — Z1231 Encounter for screening mammogram for malignant neoplasm of breast: Secondary | ICD-10-CM | POA: Diagnosis not present

## 2024-06-11 LAB — HM MAMMOGRAPHY

## 2024-06-15 ENCOUNTER — Other Ambulatory Visit: Payer: Self-pay | Admitting: Family Medicine

## 2024-06-15 DIAGNOSIS — M109 Gout, unspecified: Secondary | ICD-10-CM

## 2024-06-18 ENCOUNTER — Other Ambulatory Visit: Payer: Self-pay | Admitting: Family Medicine

## 2024-06-21 DIAGNOSIS — H903 Sensorineural hearing loss, bilateral: Secondary | ICD-10-CM | POA: Diagnosis not present

## 2024-06-24 DIAGNOSIS — R92321 Mammographic fibroglandular density, right breast: Secondary | ICD-10-CM | POA: Diagnosis not present

## 2024-06-24 DIAGNOSIS — N6312 Unspecified lump in the right breast, upper inner quadrant: Secondary | ICD-10-CM | POA: Diagnosis not present

## 2024-09-01 ENCOUNTER — Other Ambulatory Visit: Payer: Self-pay | Admitting: Family Medicine

## 2024-09-12 ENCOUNTER — Other Ambulatory Visit: Payer: Self-pay | Admitting: Cardiovascular Disease

## 2024-09-14 ENCOUNTER — Other Ambulatory Visit: Payer: Self-pay | Admitting: Family Medicine

## 2024-09-14 DIAGNOSIS — M109 Gout, unspecified: Secondary | ICD-10-CM

## 2024-09-29 ENCOUNTER — Other Ambulatory Visit: Payer: Self-pay

## 2024-09-29 NOTE — Telephone Encounter (Signed)
 Prescription Request  09/29/2024  LOV: 03/18/23  What is the name of the medication or equipment? blood glucose meter kit and supplies KIT [[id number]]   Have you contacted your pharmacy to request a refill? Yes   Which pharmacy would you like this sent to?  [facility] - [place], [place] - [address] [place] [address] Phone: [phone] Fax: [phone]    Patient notified that their request is being sent to the clinical staff for review and that they should receive a response within 2 business days.   Please advise at [facility] [phone]

## 2024-10-01 MED ORDER — BLOOD GLUCOSE MONITOR KIT: PACK | 0 refills | Status: AC

## 2024-10-01 NOTE — Telephone Encounter (Signed)
 Requested medication (s) are due for refill today: yes  Requested medication (s) are on the active medication list: yes  Last refill:  09/06/20  Future visit scheduled: {Yes  Notes to clinic:  Unable to refill per protocol, last refill by another provider.      Requested Prescriptions  Pending Prescriptions Disp Refills   blood glucose meter kit and supplies KIT 1 each 0    Sig: Dispense based on patient and insurance preference. Use up to four times daily as directed. (FOR ICD-9 250.00, 250.01).     Endocrinology: Diabetes - Testing Supplies Passed - 10/01/2024  8:55 AM      Passed - Valid encounter within last 12 months    Recent Outpatient Visits           1 year ago Encounter for Medicare annual wellness exam   [facility] Duanne Butler DASEN, MD   2 years ago Controlled type 2 diabetes mellitus with complication, without long-term current use of insulin  [facility])   [facility] Pickard, Butler DASEN, MD

## 2024-10-06 ENCOUNTER — Ambulatory Visit: Attending: Cardiovascular Disease | Admitting: Cardiovascular Disease

## 2024-10-06 ENCOUNTER — Encounter: Payer: Self-pay | Admitting: Cardiovascular Disease

## 2024-10-06 VITALS — BP 150/76 | HR 84 | Ht 64.0 in | Wt 159.8 lb

## 2024-10-06 DIAGNOSIS — R0609 Other forms of dyspnea: Secondary | ICD-10-CM | POA: Diagnosis not present

## 2024-10-06 DIAGNOSIS — E1159 Type 2 diabetes mellitus with other circulatory complications: Secondary | ICD-10-CM

## 2024-10-06 DIAGNOSIS — I152 Hypertension secondary to endocrine disorders: Secondary | ICD-10-CM | POA: Diagnosis not present

## 2024-10-06 DIAGNOSIS — E782 Mixed hyperlipidemia: Secondary | ICD-10-CM

## 2024-10-06 DIAGNOSIS — R931 Abnormal findings on diagnostic imaging of heart and coronary circulation: Secondary | ICD-10-CM | POA: Diagnosis not present

## 2024-10-06 MED ORDER — SIMVASTATIN 20 MG PO TABS: 20.0000 mg | ORAL_TABLET | Freq: Every day | ORAL | 3 refills | Status: AC

## 2024-10-06 NOTE — Assessment & Plan Note (Signed)
 History of hyperlipidemia on low-dose simvastatin  lipid profile performed/16/24 revealing total cholesterol 157, LDL of 86 and HDL 52, not at goal for secondary prevention given her elevated coronary calcium score.  I am going to increase her simvastatin  from 10 to 20 mg a day and we will recheck a lipid liver profile in 3 months, LDL goal less than 70.

## 2024-10-06 NOTE — Assessment & Plan Note (Signed)
 History of essential hypertension with blood pressure measured today at 150/76.  She is on Hyzaar.

## 2024-10-06 NOTE — Patient Instructions (Signed)
 Medication Instructions:  Your physician has recommended you make the following change in your medication:   -Increase simvastatin  (zocor ) to 20mg  once daily at bedtime.  *If you need a refill on your cardiac medications before your next appointment, please call your pharmacy*  Lab Work: Your physician recommends that you return for lab work in: 3 months for FASTING Lipid/liver panel.  If you have labs (blood work) drawn today and your tests are completely normal, you will receive your results only by: MyChart Message (if you have MyChart) OR A paper copy in the mail If you have any lab test that is abnormal or we need to change your treatment, we will call you to review the results.   Follow-Up: At [facility], you and your health needs are our priority.  As part of our continuing mission to provide you with exceptional heart care, our providers are all part of one team.  This team includes your primary Cardiologist (physician) and Advanced Practice Providers or APPs (Physician Assistants and Nurse Practitioners) who all work together to provide you with the care you need, when you need it.  Your next appointment:   12 month(s)  Provider:   Dorn Lesches, MD    We recommend signing up for the patient portal called MyChart.  Sign up information is provided on this After Visit Summary.  MyChart is used to connect with patients for Virtual Visits (Telemedicine).  Patients are able to view lab/test results, encounter notes, upcoming appointments, etc.  Non-urgent messages can be sent to your provider as well.   To learn more about what you can do with MyChart, go to [email or web address].

## 2024-10-06 NOTE — Assessment & Plan Note (Signed)
 Patient was complaining of some dyspnea when I saw her last.  She did have a 2D echo which was essentially normal except for mildly elevated pulmonary artery pressures.  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  She says that her dyspnea has improved over the last year.

## 2024-10-06 NOTE — Progress Notes (Signed)
 10/06/2024 Carla Wells   04-25-1941  [id number]  Primary Physician Duanne Butler DASEN, MD Primary Cardiologist: Dorn JINNY Lesches MD [facility] [place], [place]  HPI:  Carla Wells is a 83 y.o.   mildly overweight divorced African-American female mother of 5, grandmother 60 grandchildren who is accompanied by her daughter Carla Wells today.  She was referred by Dr. Butler Duanne for evaluation of progressive dyspnea.  I last saw her in the office 09/10/2023.  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  She never smoked.  She does have a history of hypertension, diabetes and hyperlipidemia.  Both her parents apparently had myocardial infarction's.  She is never had heart attack or stroke.  She did contract COVID in 2019 and had DVT/PE in 2021.  She had a chest CTA performed 09/03/2020 that showed extensive pulmonary artery bilaterally and distal pulmonary arteries and also of the segmental and lobar branches.  She was on Eliquis  for at least a year according to her daughter.  She dates her increasing dyspnea from this time.  Since I saw her a year ago she did have a 2D echo 05/16/2023 revealing normal LV systolic function with no valvular abnormalities and mildly elevated pulmonary artery pressures.  She had a coronary calcium score performed 05/08/2023 which was 679 distributed in all 3 coronary arteries.  She denies chest pain.  She says that her dyspnea has somewhat improved over the last year.   Current Meds  Medication Sig   albuterol  (VENTOLIN  HFA) 108 (90 Base) MCG/ACT inhaler Inhale 2 puffs into the lungs every 6 (six) hours as needed for wheezing or shortness of breath.   allopurinol  (ZYLOPRIM ) 100 MG tablet TAKE 1 TABLET BY MOUTH EVERY DAY   amLODipine  (NORVASC ) 10 MG tablet TAKE 1 TABLET BY MOUTH EVERY DAY   blood glucose meter kit and supplies KIT Dispense based on patient and insurance preference. Use up to four times daily as directed. (FOR ICD-9  250.00, 250.01).   Calcium Citrate-Vitamin D (CALCIUM + D PO) Take 1 tablet by mouth daily.   Cholecalciferol  (VITAMIN D3 PO) Take 1 tablet by mouth daily.   Colchicine  (MITIGARE ) 0.6 MG CAPS Take one tablet by mouth daily   fluticasone (FLONASE) 50 MCG/ACT nasal spray Place 2 sprays into both nostrils daily.   Insulin  Pen Needle 32G X 4 MM MISC 1 Container by Does not apply route in the morning and at bedtime.   latanoprost (XALATAN) 0.005 % ophthalmic solution Place 1 drop into both eyes at bedtime.   losartan -hydrochlorothiazide (HYZAAR) 50-12.5 MG tablet TAKE 1 TABLET EVERY DAY   metFORMIN  (GLUCOPHAGE ) 500 MG tablet TAKE 2 TABLETS TWICE DAILY WITH MEALS. PLEASE KEEP SCHEDULED APPOINTMENT FOR FURTHER REFILLS.   omeprazole  (PRILOSEC) 40 MG capsule TAKE 1 CAPSULE EVERY DAY   [DISCONTINUED] simvastatin  (ZOCOR ) 10 MG tablet TAKE 1 TABLET AT BEDTIME     Allergies  Allergen Reactions   Penicillins Swelling    Facial swelling    Social History   Socioeconomic History   Marital status: Single    Spouse name: Not on file   Number of children: 5   Years of education: Not on file   Highest education level: Not on file  Occupational History   Not on file  Tobacco Use   Smoking status: Never   Smokeless tobacco: Former    Types: Snuff  Substance and Sexual Activity   Alcohol use: No  Drug use: No   Sexual activity: Not Currently  Other Topics Concern   Not on file  Social History Narrative   5 Children   11 grandchildren   8 great grandchildren.   Sees family often, most live locally.   Keeps great granddaughter 2-3 days per week.   Social Drivers of Corporate Investment Banker Strain: Low Risk  (05/27/2024)   Overall Financial Resource Strain (CARDIA)    Difficulty of Paying Living Expenses: Not hard at all  Food Insecurity: No Food Insecurity (05/27/2024)   Hunger Vital Sign    Worried About Running Out of Food in the Last Year: Never true    Ran Out of Food in the Last  Year: Never true  Transportation Needs: No Transportation Needs (05/27/2024)   PRAPARE - Administrator, Civil Service (Medical): No    Lack of Transportation (Non-Medical): No  Physical Activity: Inactive (05/27/2024)   Exercise Vital Sign    Days of Exercise per Week: 0 days    Minutes of Exercise per Session: 0 min  Stress: No Stress Concern Present (05/27/2024)   [employer] of Occupational Health - Occupational Stress Questionnaire    Feeling of Stress: Not at all  Social Connections: Moderately Integrated (05/27/2024)   Social Connection and Isolation Panel    Frequency of Communication with Friends and Family: Twice a week    Frequency of Social Gatherings with Friends and Family: Once a week    Attends Religious Services: More than 4 times per year    Active Member of [employer] or Organizations: Yes    Attends Banker Meetings: More than 4 times per year    Marital Status: Widowed  Intimate Partner Violence: Not At Risk (05/27/2024)   Humiliation, Afraid, Rape, and Kick questionnaire    Fear of Current or Ex-Partner: No    Emotionally Abused: No    Physically Abused: No    Sexually Abused: No     Review of Systems: General: negative for chills, fever, night sweats or weight changes.  Cardiovascular: negative for chest pain, dyspnea on exertion, edema, orthopnea, palpitations, paroxysmal nocturnal dyspnea or shortness of breath Dermatological: negative for rash Respiratory: negative for cough or wheezing Urologic: negative for hematuria Abdominal: negative for nausea, vomiting, diarrhea, bright red blood per rectum, melena, or hematemesis Neurologic: negative for visual changes, syncope, or dizziness All other systems reviewed and are otherwise negative except as noted above.    Blood pressure (!) 150/76, pulse 84, height 5' 4 (1.626 m), weight 159 lb 12.8 oz (72.5 kg), SpO2 98%.  General appearance: alert and no distress Neck: no adenopathy, no  carotid bruit, no JVD, supple, symmetrical, trachea midline, and thyroid  not enlarged, symmetric, no tenderness/mass/nodules Lungs: clear to auscultation bilaterally Heart: regular rate and rhythm, S1, S2 normal, no murmur, click, rub or gallop Extremities: extremities normal, atraumatic, no cyanosis or edema Pulses: 2+ and symmetric Skin: Skin color, texture, turgor normal. No rashes or lesions Neurologic: Grossly normal  EKG EKG Interpretation Date/Time:  Wednesday October 06 2024 10:03:23 EST Ventricular Rate:  84 PR Interval:  180 QRS Duration:  90 QT Interval:  358 QTC Calculation: 423 R Axis:   -4  Text Interpretation: Normal sinus rhythm Moderate voltage criteria for LVH, may be normal variant ( R in aVL , Cornell product ) Septal infarct (cited on or before 03-Sep-2020) T wave abnormality, consider inferolateral ischemia When compared with ECG of 03-Sep-2020 09:19, Questionable change in initial forces of  Septal leads T wave inversion now evident in Inferior leads T wave inversion now evident in Lateral leads Confirmed by Court Carrier [phone]) on 10/06/2024 10:21:55 AM    ASSESSMENT AND PLAN:   Hypertension associated with diabetes (HCC) History of essential hypertension with blood pressure measured today at 150/76.  She is on Hyzaar.  Hyperlipidemia History of hyperlipidemia on low-dose simvastatin  lipid profile performed/16/24 revealing total cholesterol 157, LDL of 86 and HDL 52, not at goal for secondary prevention given her elevated coronary calcium score.  I am going to increase her simvastatin  from 10 to 20 mg a day and we will recheck a lipid liver profile in 3 months, LDL goal less than 70.  Elevated coronary artery calcium score Coronary calcium score performed 08/12/2023 was 679 distributed in all 3 coronary arteries.  She denies chest pain.  She is not at goal for secondary prevention.  Will adjust her lipid profile.  Dyspnea on exertion Patient was complaining of  some dyspnea when I saw her last.  She did have a 2D echo which was essentially normal except for mildly elevated pulmonary artery pressures.  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  She says that her dyspnea has improved over the last year.     Carrier DOROTHA Court MD FACP,FACC,FAHA, [facility] 10/06/2024 10:30 AM

## 2024-10-06 NOTE — Assessment & Plan Note (Signed)
 Coronary calcium score performed 08/12/2023 was 679 distributed in all 3 coronary arteries.  She denies chest pain.  She is not at goal for secondary prevention.  Will adjust her lipid profile.

## 2024-10-21 ENCOUNTER — Ambulatory Visit (INDEPENDENT_AMBULATORY_CARE_PROVIDER_SITE_OTHER): Admitting: Family Medicine

## 2024-10-21 ENCOUNTER — Encounter: Payer: Self-pay | Admitting: Family Medicine

## 2024-10-21 VITALS — BP 144/72 | HR 88 | Temp 97.9°F | Ht 64.0 in | Wt 158.2 lb

## 2024-10-21 DIAGNOSIS — J069 Acute upper respiratory infection, unspecified: Secondary | ICD-10-CM | POA: Diagnosis not present

## 2024-10-21 MED ORDER — HYDROCODONE BIT-HOMATROP MBR 5-1.5 MG/5ML PO SOLN: 5.0000 mL | Freq: Three times a day (TID) | ORAL | 0 refills | Status: AC | PRN

## 2024-10-21 NOTE — Progress Notes (Signed)
 Subjective:    Patient ID: Carla Wells, female    DOB: 11-04-1941, 83 y.o.   MRN: [id number]  Patient states that she has had a cough for approximately 5 days.  The cough is nonproductive.  It is more of a tickle cough in the back of her throat.  She occasionally gets some congestion in her upper airways but she denies any productive sputum.  She denies any significant rhinorrhea.  She denies any sore throat.  She denies any sinus pain or otalgia.  She denies any chest pain or shortness of breath or dyspnea on exertion.  She denies any fevers or chills. Past Medical History:  Diagnosis Date   Abnormal pap    Cataract    Colon polyps    Diabetes mellitus without complication (HCC)    Hyperlipidemia    Hypertension    Murmur    No past surgical history on file. Current Outpatient Medications on File Prior to Visit  Medication Sig Dispense Refill   albuterol  (VENTOLIN  HFA) 108 (90 Base) MCG/ACT inhaler Inhale 2 puffs into the lungs every 6 (six) hours as needed for wheezing or shortness of breath. 8 g 0   allopurinol  (ZYLOPRIM ) 100 MG tablet TAKE 1 TABLET BY MOUTH EVERY DAY 90 tablet 0   amLODipine  (NORVASC ) 10 MG tablet TAKE 1 TABLET BY MOUTH EVERY DAY 90 tablet 0   blood glucose meter kit and supplies KIT Dispense based on patient and insurance preference. Use up to four times daily as directed. (FOR ICD-9 250.00, 250.01). 1 each 0   Calcium Citrate-Vitamin D (CALCIUM + D PO) Take 1 tablet by mouth daily.     Cholecalciferol  (VITAMIN D3 PO) Take 1 tablet by mouth daily.     Colchicine  (MITIGARE ) 0.6 MG CAPS Take one tablet by mouth daily 90 capsule 0   Insulin  Pen Needle 32G X 4 MM MISC 1 Container by Does not apply route in the morning and at bedtime. 100 each 0   latanoprost (XALATAN) 0.005 % ophthalmic solution Place 1 drop into both eyes at bedtime.     losartan -hydrochlorothiazide (HYZAAR) 50-12.5 MG tablet TAKE 1 TABLET EVERY DAY 90 tablet 3   metFORMIN  (GLUCOPHAGE ) 500 MG tablet  TAKE 2 TABLETS TWICE DAILY WITH MEALS. PLEASE KEEP SCHEDULED APPOINTMENT FOR FURTHER REFILLS. 120 tablet 11   omeprazole  (PRILOSEC) 40 MG capsule TAKE 1 CAPSULE EVERY DAY 90 capsule 0   simvastatin  (ZOCOR ) 20 MG tablet Take 1 tablet (20 mg total) by mouth at bedtime. 90 tablet 3   fluticasone (FLONASE) 50 MCG/ACT nasal spray Place 2 sprays into both nostrils daily. (Patient not taking: Reported on 10/21/2024)     No current facility-administered medications on file prior to visit.   Allergies  Allergen Reactions   Penicillins Swelling    Facial swelling   Social History   Socioeconomic History   Marital status: Single    Spouse name: Not on file   Number of children: 5   Years of education: Not on file   Highest education level: Not on file  Occupational History   Not on file  Tobacco Use   Smoking status: Never   Smokeless tobacco: Former    Types: Snuff  Substance and Sexual Activity   Alcohol use: No   Drug use: No   Sexual activity: Not Currently  Other Topics Concern   Not on file  Social History Narrative   5 Children   11 grandchildren   8 great grandchildren.  Sees family often, most live locally.   Keeps great granddaughter 2-3 days per week.   Social Drivers of Corporate Investment Banker Strain: Low Risk  (05/27/2024)   Overall Financial Resource Strain (CARDIA)    Difficulty of Paying Living Expenses: Not hard at all  Food Insecurity: No Food Insecurity (05/27/2024)   Hunger Vital Sign    Worried About Running Out of Food in the Last Year: Never true    Ran Out of Food in the Last Year: Never true  Transportation Needs: No Transportation Needs (05/27/2024)   PRAPARE - Administrator, Civil Service (Medical): No    Lack of Transportation (Non-Medical): No  Physical Activity: Inactive (05/27/2024)   Exercise Vital Sign    Days of Exercise per Week: 0 days    Minutes of Exercise per Session: 0 min  Stress: No Stress Concern Present (05/27/2024)    [employer] of Occupational Health - Occupational Stress Questionnaire    Feeling of Stress: Not at all  Social Connections: Moderately Integrated (05/27/2024)   Social Connection and Isolation Panel    Frequency of Communication with Friends and Family: Twice a week    Frequency of Social Gatherings with Friends and Family: Once a week    Attends Religious Services: More than 4 times per year    Active Member of [employer] or Organizations: Yes    Attends Banker Meetings: More than 4 times per year    Marital Status: Widowed  Intimate Partner Violence: Not At Risk (05/27/2024)   Humiliation, Afraid, Rape, and Kick questionnaire    Fear of Current or Ex-Partner: No    Emotionally Abused: No    Physically Abused: No    Sexually Abused: No     Review of Systems  Respiratory:  Positive for cough.   All other systems reviewed and are negative.      Objective:   Physical Exam Vitals reviewed.  Constitutional:      General: She is not in acute distress.    Appearance: Normal appearance. She is normal weight. She is not ill-appearing or toxic-appearing.  HENT:     Head: Normocephalic and atraumatic.     Right Ear: Tympanic membrane and ear canal normal.     Left Ear: Tympanic membrane and ear canal normal.     Nose: Nose normal. No congestion or rhinorrhea.     Mouth/Throat:     Mouth: Mucous membranes are moist.     Pharynx: No oropharyngeal exudate or posterior oropharyngeal erythema.  Eyes:     Conjunctiva/sclera: Conjunctivae normal.  Neck:     Vascular: No carotid bruit.  Cardiovascular:     Rate and Rhythm: Normal rate and regular rhythm.     Heart sounds: Normal heart sounds. No murmur heard.    No gallop.  Pulmonary:     Effort: Pulmonary effort is normal. No respiratory distress.     Breath sounds: Normal breath sounds. No stridor. No wheezing, rhonchi or rales.  Abdominal:     General: Abdomen is flat. Bowel sounds are normal. There is no  distension.     Palpations: Abdomen is soft.     Tenderness: There is no abdominal tenderness. There is no guarding or rebound.  Musculoskeletal:     Cervical back: Normal range of motion and neck supple. No tenderness.     Right lower leg: No edema.     Left lower leg: No edema.  Lymphadenopathy:     Cervical:  No cervical adenopathy.  Skin:    General: Skin is warm and dry.     Coloration: Skin is not jaundiced or pale.     Findings: No bruising, erythema, lesion or rash.  Neurological:     Mental Status: She is alert.           Assessment & Plan:  Upper respiratory tract infection, unspecified type Physical exam today is completely normal.  I suspect a viral upper respiratory infection.  I suspect self-limited resolution over the next 5 days.  Recommended Hycodan 1 teaspoon every 6 hours as needed for cough and tincture of time.  No evidence of pneumonia today on exam.  Recheck in 1 week if no better or sooner if worse

## 2024-11-13 ENCOUNTER — Other Ambulatory Visit: Payer: Self-pay | Admitting: Family Medicine

## 2024-11-19 ENCOUNTER — Ambulatory Visit: Admitting: Family Medicine

## 2024-11-19 VITALS — BP 124/70 | HR 93 | Temp 97.8°F | Ht 64.0 in | Wt 158.6 lb

## 2024-11-19 DIAGNOSIS — I152 Hypertension secondary to endocrine disorders: Secondary | ICD-10-CM

## 2024-11-19 DIAGNOSIS — Z7984 Long term (current) use of oral hypoglycemic drugs: Secondary | ICD-10-CM | POA: Diagnosis not present

## 2024-11-19 DIAGNOSIS — E1159 Type 2 diabetes mellitus with other circulatory complications: Secondary | ICD-10-CM

## 2024-11-19 DIAGNOSIS — E1122 Type 2 diabetes mellitus with diabetic chronic kidney disease: Secondary | ICD-10-CM | POA: Diagnosis not present

## 2024-11-19 DIAGNOSIS — N1831 Chronic kidney disease, stage 3a: Secondary | ICD-10-CM

## 2024-11-19 DIAGNOSIS — E1169 Type 2 diabetes mellitus with other specified complication: Secondary | ICD-10-CM

## 2024-11-19 DIAGNOSIS — E785 Hyperlipidemia, unspecified: Secondary | ICD-10-CM | POA: Diagnosis not present

## 2024-11-19 DIAGNOSIS — Z0000 Encounter for general adult medical examination without abnormal findings: Secondary | ICD-10-CM

## 2024-11-19 NOTE — Progress Notes (Signed)
 "  Subjective:    Patient ID: Carla Wells, female    DOB: 1941/01/04, 83 y.o.   MRN: [id number]  HPI  Patient is a very pleasant 83 year old African-American female who presents today for CPE.  Patient seems to be doing extremely well.  She denies any concerns.  She recently had a gout exacerbation in her left hand however she took colchicine  and the attack was aborted.  She has not been taking allopurinol .  She states that she has not had a gout attack in more than a year and therefore she has decided not to take the allopurinol .  Otherwise she is doing well.  I reviewed her immunizations.  These are all up-to-date except for the RSV shot.  She had her COVID shot and her flu shot at her pharmacy Immunization History  Administered Date(s) Administered   Fluad Quad(high Dose 65+) 08/27/2019, 09/11/2020   INFLUENZA, HIGH DOSE SEASONAL PF 09/09/2018, 09/05/2022   Influenza Split 11/06/2012   Influenza,inj,Quad PF,6+ Mos 09/16/2013, 09/06/2014, 09/07/2015, 09/26/2016, 09/11/2017   Influenza-Unspecified 08/31/2021   PFIZER(Purple Top)SARS-COV-2 Vaccination 01/11/2020, 02/05/2020, 03/13/2021   Pfizer Covid-19 Vaccine Bivalent Booster 80yrs & up 08/31/2021   Pneumococcal Conjugate-13 11/04/2013   Pneumococcal Polysaccharide-23 10/03/2010   Zoster Recombinant(Shingrix) 05/30/2022, 09/10/2022   Her mammogram was performed earlier this year and was normal.  Due to her age she does not require colonoscopy or a Pap smear.  Her review of systems is negative.  She had a bone density test 3 years ago that was excellent.  I do not feel that we need to repeat this at the present time. Past Medical History:  Diagnosis Date   Abnormal pap    Cataract    Colon polyps    Diabetes mellitus without complication (HCC)    Hyperlipidemia    Hypertension    Murmur    No past surgical history on file. Current Outpatient Medications on File Prior to Visit  Medication Sig Dispense Refill   albuterol  (VENTOLIN   HFA) 108 (90 Base) MCG/ACT inhaler Inhale 2 puffs into the lungs every 6 (six) hours as needed for wheezing or shortness of breath. 8 g 0   amLODipine  (NORVASC ) 10 MG tablet TAKE 1 TABLET BY MOUTH EVERY DAY 90 tablet 0   blood glucose meter kit and supplies KIT Dispense based on patient and insurance preference. Use up to four times daily as directed. (FOR ICD-9 250.00, 250.01). 1 each 0   Calcium Citrate-Vitamin D (CALCIUM + D PO) Take 1 tablet by mouth daily.     Cholecalciferol  (VITAMIN D3 PO) Take 1 tablet by mouth daily.     HYDROcodone  bit-homatropine (HYCODAN) 5-1.5 MG/5ML syrup Take 5 mLs by mouth every 8 (eight) hours as needed for cough. 120 mL 0   metFORMIN  (GLUCOPHAGE ) 500 MG tablet TAKE 2 TABLETS TWICE DAILY WITH MEALS. PLEASE KEEP SCHEDULED APPOINTMENT FOR FURTHER REFILLS. 120 tablet 11   omeprazole  (PRILOSEC) 40 MG capsule TAKE 1 CAPSULE EVERY DAY 90 capsule 3   simvastatin  (ZOCOR ) 20 MG tablet Take 1 tablet (20 mg total) by mouth at bedtime. 90 tablet 3   allopurinol  (ZYLOPRIM ) 100 MG tablet TAKE 1 TABLET BY MOUTH EVERY DAY (Patient not taking: Reported on 11/19/2024) 90 tablet 0   Colchicine  (MITIGARE ) 0.6 MG CAPS Take one tablet by mouth daily (Patient not taking: Reported on 11/19/2024) 90 capsule 0   fluticasone (FLONASE) 50 MCG/ACT nasal spray Place 2 sprays into both nostrils daily. (Patient not taking: Reported on 11/19/2024)  Insulin  Pen Needle 32G X 4 MM MISC 1 Container by Does not apply route in the morning and at bedtime. (Patient not taking: Reported on 11/19/2024) 100 each 0   latanoprost (XALATAN) 0.005 % ophthalmic solution Place 1 drop into both eyes at bedtime. (Patient not taking: Reported on 11/19/2024)     losartan -hydrochlorothiazide (HYZAAR) 50-12.5 MG tablet TAKE 1 TABLET EVERY DAY (Patient not taking: Reported on 11/19/2024) 90 tablet 3   No current facility-administered medications on file prior to visit.   Allergies  Allergen Reactions   Penicillins  Swelling    Facial swelling   Social History   Socioeconomic History   Marital status: Single    Spouse name: Not on file   Number of children: 5   Years of education: Not on file   Highest education level: Not on file  Occupational History   Not on file  Tobacco Use   Smoking status: Never   Smokeless tobacco: Former    Types: Snuff  Substance and Sexual Activity   Alcohol use: No   Drug use: No   Sexual activity: Not Currently  Other Topics Concern   Not on file  Social History Narrative   5 Children   11 grandchildren   8 great grandchildren.   Sees family often, most live locally.   Keeps great granddaughter 2-3 days per week.   Social Drivers of Health   Tobacco Use: Medium Risk (10/21/2024)   Patient History    Smoking Tobacco Use: Never    Smokeless Tobacco Use: Former    Passive Exposure: Not on Actuary Strain: Low Risk (05/27/2024)   Overall Financial Resource Strain (CARDIA)    Difficulty of Paying Living Expenses: Not hard at all  Food Insecurity: No Food Insecurity (05/27/2024)   Epic    Worried About Radiation Protection Practitioner of Food in the Last Year: Never true    Ran Out of Food in the Last Year: Never true  Transportation Needs: No Transportation Needs (05/27/2024)   Epic    Lack of Transportation (Medical): No    Lack of Transportation (Non-Medical): No  Physical Activity: Inactive (05/27/2024)   Exercise Vital Sign    Days of Exercise per Week: 0 days    Minutes of Exercise per Session: 0 min  Stress: No Stress Concern Present (05/27/2024)   [employer] of Occupational Health - Occupational Stress Questionnaire    Feeling of Stress: Not at all  Social Connections: Moderately Integrated (05/27/2024)   Social Connection and Isolation Panel    Frequency of Communication with Friends and Family: Twice a week    Frequency of Social Gatherings with Friends and Family: Once a week    Attends Religious Services: More than 4 times per year     Active Member of [employer] or Organizations: Yes    Attends Banker Meetings: More than 4 times per year    Marital Status: Widowed  Intimate Partner Violence: Not At Risk (05/27/2024)   Epic    Fear of Current or Ex-Partner: No    Emotionally Abused: No    Physically Abused: No    Sexually Abused: No  Depression (PHQ2-9): Low Risk (11/19/2024)   Depression (PHQ2-9)    PHQ-2 Score: 0  Alcohol Screen: Low Risk (05/27/2024)   Alcohol Screen    Last Alcohol Screening Score (AUDIT): 0  Housing: Unknown (05/27/2024)   Epic    Unable to Pay for Housing in the Last Year: No  Number of Times Moved in the Last Year: Not on file    Homeless in the Last Year: No  Utilities: Not At Risk (05/27/2024)   Epic    Threatened with loss of utilities: No  Health Literacy: Adequate Health Literacy (05/27/2024)   B1300 Health Literacy    Frequency of need for help with medical instructions: Never     Review of Systems  All other systems reviewed and are negative.      Objective:   Physical Exam Vitals reviewed.  Constitutional:      General: She is not in acute distress.    Appearance: Normal appearance. She is normal weight. She is not ill-appearing or toxic-appearing.  HENT:     Head: Normocephalic and atraumatic.  Neck:     Vascular: No carotid bruit.  Cardiovascular:     Rate and Rhythm: Normal rate and regular rhythm.     Heart sounds: Normal heart sounds. No murmur heard.    No gallop.  Pulmonary:     Effort: Pulmonary effort is normal. No respiratory distress.     Breath sounds: Normal breath sounds. No stridor. No wheezing, rhonchi or rales.  Abdominal:     General: Abdomen is flat. Bowel sounds are normal. There is no distension.     Palpations: Abdomen is soft.     Tenderness: There is no abdominal tenderness. There is no guarding or rebound.  Musculoskeletal:     Right lower leg: No edema.     Left lower leg: No edema.  Skin:    General: Skin is warm and dry.      Coloration: Skin is not jaundiced or pale.     Findings: No bruising, erythema, lesion or rash.  Neurological:     General: No focal deficit present.     Mental Status: She is alert and oriented to person, place, and time. Mental status is at baseline.     Cranial Nerves: No cranial nerve deficit.     Sensory: No sensory deficit.     Motor: No weakness.     Coordination: Coordination normal.     Gait: Gait normal.     Deep Tendon Reflexes: Reflexes normal.  Psychiatric:        Mood and Affect: Mood normal.        Behavior: Behavior normal.        Thought Content: Thought content normal.        Judgment: Judgment normal.           Assessment & Plan:  Stage 3a chronic kidney disease (HCC)  Hyperlipidemia associated with type 2 diabetes mellitus (HCC)  Hypertension associated with diabetes (HCC) - Plan: CBC with Differential/Platelet, Comprehensive metabolic panel with GFR, Lipid panel, Microalbumin / creatinine urine ratio, Hemoglobin A1c  General medical exam Regarding her physical exam, her blood pressure is outstanding..  I will check a fasting lipid panel.  I would like her LDL cholesterol to be below 70 because of her diabetes.  I will check a hemoglobin A1c.  I would like this to be below 6.5.  I will also check a microalbumin to creatinine ratio which I would like to be below 30.  Her immunizations are up-to-date except for the RSV vaccine which I recommended.  Her mammogram is not due again until next year.  She does not require Pap smear or colonoscopy.  We discussed the bone density.  I recommended calcium and vitamin D but I do not feel that she needs to  repeat a bone density at the present time.  Patient denies any falls, memory loss, or depression "

## 2024-11-20 LAB — LIPID PANEL
Cholesterol: 130 mg/dL
HDL: 48 mg/dL — ABNORMAL LOW
LDL Cholesterol (Calc): 64 mg/dL
Non-HDL Cholesterol (Calc): 82 mg/dL
Total CHOL/HDL Ratio: 2.7 (calc)
Triglycerides: 92 mg/dL

## 2024-11-20 LAB — MICROALBUMIN / CREATININE URINE RATIO
Creatinine, Urine: 244 mg/dL (ref 20–275)
Microalb Creat Ratio: 10 mg/g{creat}
Microalb, Ur: 2.5 mg/dL

## 2024-11-20 LAB — COMPREHENSIVE METABOLIC PANEL WITH GFR
AG Ratio: 1.3 (calc) (ref 1.0–2.5)
ALT: 8 U/L (ref 6–29)
AST: 13 U/L (ref 10–35)
Albumin: 4.1 g/dL (ref 3.6–5.1)
Alkaline phosphatase (APISO): 78 U/L (ref 37–153)
BUN/Creatinine Ratio: 20 (calc) (ref 6–22)
BUN: 21 mg/dL (ref 7–25)
CO2: 24 mmol/L (ref 20–32)
Calcium: 10.2 mg/dL (ref 8.6–10.4)
Chloride: 103 mmol/L (ref 98–110)
Creat: 1.07 mg/dL — ABNORMAL HIGH (ref 0.60–0.95)
Globulin: 3.1 g/dL (ref 1.9–3.7)
Glucose, Bld: 103 mg/dL — ABNORMAL HIGH (ref 65–99)
Potassium: 4.3 mmol/L (ref 3.5–5.3)
Sodium: 137 mmol/L (ref 135–146)
Total Bilirubin: 0.3 mg/dL (ref 0.2–1.2)
Total Protein: 7.2 g/dL (ref 6.1–8.1)
eGFR: 52 mL/min/1.73m2 — ABNORMAL LOW

## 2024-11-20 LAB — CBC WITH DIFFERENTIAL/PLATELET
Absolute Lymphocytes: 1877 {cells}/uL (ref 850–3900)
Absolute Monocytes: 691 {cells}/uL (ref 200–950)
Basophils Absolute: 29 {cells}/uL (ref 0–200)
Basophils Relative: 0.6 %
Eosinophils Absolute: 49 {cells}/uL (ref 15–500)
Eosinophils Relative: 1 %
HCT: 40.6 % (ref 35.9–46.0)
Hemoglobin: 13 g/dL (ref 11.7–15.5)
MCH: 26.1 pg — ABNORMAL LOW (ref 27.0–33.0)
MCHC: 32 g/dL (ref 31.6–35.4)
MCV: 81.5 fL (ref 81.4–101.7)
MPV: 11.9 fL (ref 7.5–12.5)
Monocytes Relative: 14.1 %
Neutro Abs: 2254 {cells}/uL (ref 1500–7800)
Neutrophils Relative %: 46 %
Platelets: 288 Thousand/uL (ref 140–400)
RBC: 4.98 Million/uL (ref 3.80–5.10)
RDW: 14.3 % (ref 11.0–15.0)
Total Lymphocyte: 38.3 %
WBC: 4.9 Thousand/uL (ref 3.8–10.8)

## 2024-11-20 LAB — HEMOGLOBIN A1C
Hgb A1c MFr Bld: 5.9 % — ABNORMAL HIGH
Mean Plasma Glucose: 123 mg/dL
eAG (mmol/L): 6.8 mmol/L

## 2024-11-22 ENCOUNTER — Ambulatory Visit: Payer: Self-pay | Admitting: Family Medicine

## 2024-12-16 ENCOUNTER — Ambulatory Visit: Payer: Self-pay

## 2024-12-16 NOTE — Telephone Encounter (Signed)
 FYI Only or Action Required?: FYI only for provider: appointment scheduled on 1/16.  Patient was last seen in primary care on 11/19/2024 by Duanne Butler DASEN, MD.  Called Nurse Triage reporting Shoulder Pain.  Symptoms began yesterday.  Interventions attempted: Nothing.  Symptoms are: unchanged.  Triage Disposition: See Physician Within 24 Hours  Patient/caregiver understands and will follow disposition?: Yes, will follow disposition  Copied from CRM #[id number]. Topic: Clinical - Red Word Triage >> Dec 16, 2024 11:23 AM Olam RAMAN wrote: Red Word that prompted transfer to Nurse Triage: Pt is stating she is having shoulder pain and not able to raise arm   ----------------------------------------------------------------------- From previous Reason for Contact - Scheduling: Patient/patient representative is calling to schedule an appointment. Refer to attachments for appointment information. Reason for Disposition  [1] Unable to use arm at all AND [2] because of shoulder pain or stiffness  Answer Assessment - Initial Assessment Questions 1. ONSET: When did the pain start?     yesterday 2. LOCATION: Where is the pain located?     R shoulder 3. PAIN: How bad is the pain? (Scale 1-10; or mild, moderate, severe)     moderate 4. WORK OR EXERCISE: Has there been any recent work or exercise that involved this part of the body?     denies 5. CAUSE: What do you think is causing the shoulder pain?     arthritis 6. OTHER SYMPTOMS: Do you have any other symptoms? (e.g., neck pain, swelling, rash, fever, numbness, weakness)     Denies swelling, denies SOB, denies CP  Pt states she has had this pain in the other shoulder in the past and was told it was arthritis. Pt states worse to lift arm.  Protocols used: Shoulder Pain-A-AH

## 2024-12-17 ENCOUNTER — Encounter: Payer: Self-pay | Admitting: Family Medicine

## 2024-12-17 ENCOUNTER — Ambulatory Visit (HOSPITAL_COMMUNITY)
Admission: RE | Admit: 2024-12-17 | Discharge: 2024-12-17 | Disposition: A | Discharge status: Home / Self Care | Source: Ambulatory Visit | Attending: Family Medicine | Admitting: Family Medicine

## 2024-12-17 ENCOUNTER — Ambulatory Visit (INDEPENDENT_AMBULATORY_CARE_PROVIDER_SITE_OTHER): Admitting: Family Medicine

## 2024-12-17 VITALS — BP 118/78 | HR 70 | Ht 64.0 in | Wt 156.0 lb

## 2024-12-17 DIAGNOSIS — E119 Type 2 diabetes mellitus without complications: Secondary | ICD-10-CM

## 2024-12-17 DIAGNOSIS — M25511 Pain in right shoulder: Secondary | ICD-10-CM | POA: Insufficient documentation

## 2024-12-17 DIAGNOSIS — E1165 Type 2 diabetes mellitus with hyperglycemia: Secondary | ICD-10-CM

## 2024-12-17 MED ORDER — PREDNISONE 10 MG (21) PO TBPK: ORAL_TABLET | ORAL | 0 refills | Status: AC

## 2024-12-17 MED ORDER — PREDNISONE 10 MG (21) PO TBPK: ORAL_TABLET | ORAL | 0 refills | Status: DC

## 2024-12-17 NOTE — Progress Notes (Signed)
 "  Patient Office Visit  Assessment & Plan:  Acute pain of right shoulder -     DG Shoulder Right; Future -     predniSONE ; Use as directed.  Dispense: 21 each; Refill: 0  Type 2 diabetes mellitus with hyperglycemia, without long-term current use of insulin  (HCC)   Assessment and Plan    Adhesive capsulitis of right shoulder Adhesive capsulitis with severe pain and limited range of motion. Differential includes arthritis. Tylenol  ineffective. Prednisone  considered for inflammation and pain control. - Ordered x-ray of the right shoulder. - Prescribed prednisone . - Advised maintaining shoulder mobility. - Will follow up with x-ray results and adjust treatment plan.      Told patient that I was concerned that she may have adhesive capsulitis/frozen shoulder.  Patient may need to see orthopedic regarding this and PT. Will follow up on x-ray overread Prednisone  taper sent to the pharmacy. Return if symptoms worsen or fail to improve.   Subjective:    Patient ID: Carla Wells, female    DOB: June 13, 1941  Age: 84 y.o. MRN: [id number]  Chief Complaint  Patient presents with   Shoulder Pain    R Shoulder pain x 1 week.    Hand Pain    Bilateral hands- soreness.    HPI Discussed the use of AI scribe software for clinical note transcription with the patient, who gave verbal consent to proceed.      History of Present Illness Carla Wells is an 84 year old female with diabetes who presents with right shoulder pain and limited mobility for one week. Denies trauma  She has been experiencing right shoulder pain and limited mobility for the past week. The symptoms began with soreness in her hands, progressed to her elbow, and then to her shoulder. She describes an inability to lift her right arm, stating she can move it slightly but cannot raise it fully. No recent trauma or falls. She has a history of a similar condition in her left shoulder, where she experienced similar symptoms. Being  right-handed, the current issue affects her dominant side, impacting daily activities such as driving, where she has to use her left hand to assist with steering.  She has been taking Tylenol  for the pain, but it has not been effective. She rates her pain as significant when attempting to move the shoulder, but not at rest. She denies taking any blood thinners currently, although she has a history of blood clots.  Her diabetes is generally well-controlled, with a recent A1c of 5.9. However, she notes that her blood sugar levels tend to rise when she is in pain. She reports sleeping better last night than she has been recently, indicating some improvement in her sleep quality.  In terms of social history, she lives mostly by herself and is able to drive, although she prefers not to drive in [place]. She does not smoke and has received her flu and COVID vaccinations.  Physical Exam MUSCULOSKELETAL: Right shoulder pain on resisted movement. Right shoulder tenderness on palpation.  Results Labs HbA1c (11/2024): 5.9  Assessment and Plan Adhesive capsulitis of right shoulder Adhesive capsulitis with severe pain and limited range of motion. Differential includes arthritis. Tylenol  ineffective. Prednisone  considered for inflammation and pain control. - Ordered x-ray of the right shoulder. - Prescribed prednisone  taper x 6 days - Advised maintaining shoulder mobility. - Will follow up with x-ray results and adjust treatment plan.    The ASCVD Risk score (Arnett DK, et al., 2019) failed to calculate  for the following reasons:   The 2019 ASCVD risk score is only valid for ages 54 to 74   * - Cholesterol units were assumed  Past Medical History:  Diagnosis Date   Abnormal pap    Cataract    Colon polyps    Diabetes mellitus without complication (HCC)    Hyperlipidemia    Hypertension    Murmur    History reviewed. No pertinent surgical history. Social History[1] Family History   Problem Relation Age of Onset   Stroke Mother    Kidney failure Father    Diabetes Brother    Cancer Brother        Lung - Smoker   Lung cancer Brother    Cervical cancer Sister    Cancer Sister    Cancer Sister        ?Cervical CA   Allergies[2]  ROS    Objective:    BP 118/78   Pulse 70   Ht 5' 4 (1.626 m)   Wt 156 lb (70.8 kg)   SpO2 98%   BMI 26.78 kg/m  BP Readings from Last 3 Encounters:  12/17/24 118/78  11/19/24 124/70  10/21/24 (!) 144/72   Wt Readings from Last 3 Encounters:  12/17/24 156 lb (70.8 kg)  11/19/24 158 lb 9.6 oz (71.9 kg)  10/21/24 158 lb 3.2 oz (71.8 kg)    Physical Exam Vitals and nursing note reviewed.  Constitutional:      General: She is not in acute distress.    Appearance: Normal appearance.  HENT:     Head: Normocephalic.     Right Ear: Tympanic membrane and ear canal normal.     Left Ear: Tympanic membrane and ear canal normal.  Eyes:     Extraocular Movements: Extraocular movements intact.     Pupils: Pupils are equal, round, and reactive to light.  Cardiovascular:     Rate and Rhythm: Normal rate and regular rhythm.     Heart sounds: Normal heart sounds.  Pulmonary:     Effort: Pulmonary effort is normal.     Breath sounds: Normal breath sounds.  Musculoskeletal:     Right shoulder: Tenderness and bony tenderness present. Decreased range of motion.     Left shoulder: Normal. No tenderness. Normal range of motion.     Right lower leg: No edema.     Left lower leg: No edema.     Comments: Left shoulder- Patient is able to raise the left arm above shoulder level.   Right shoulder- Patient has exquisite tenderness over the [facility] joint on the right side and cannot raise her arm above shoulder level, she minimal range of motion right side.   Neurological:     General: No focal deficit present.     Mental Status: She is alert and oriented to person, place, and time.  Psychiatric:        Mood and Affect: Mood normal.         Behavior: Behavior normal.      No results found for any visits on 12/17/24.           [1]  Social History Tobacco Use   Smoking status: Never   Smokeless tobacco: Former    Types: Snuff  Substance Use Topics   Alcohol use: No   Drug use: No  [2]  Allergies Allergen Reactions   Penicillins Swelling    Facial swelling   "

## 2024-12-24 ENCOUNTER — Ambulatory Visit: Payer: Self-pay | Admitting: Family Medicine

## 2024-12-24 NOTE — Progress Notes (Signed)
Phone keeps ringing

## 2024-12-27 ENCOUNTER — Other Ambulatory Visit: Payer: Self-pay | Admitting: Family Medicine

## 2024-12-27 DIAGNOSIS — M25511 Pain in right shoulder: Secondary | ICD-10-CM

## 2025-06-02 ENCOUNTER — Encounter
